# Patient Record
Sex: Male | Born: 1950
Health system: Southern US, Community
[De-identification: ages and names within clinical notes are randomized; demographics above are authoritative.]

## PROBLEM LIST (undated history)

## (undated) DIAGNOSIS — E785 Hyperlipidemia, unspecified: Secondary | ICD-10-CM

## (undated) DIAGNOSIS — Z973 Presence of spectacles and contact lenses: Secondary | ICD-10-CM

## (undated) DIAGNOSIS — Z8719 Personal history of other diseases of the digestive system: Secondary | ICD-10-CM

## (undated) DIAGNOSIS — N401 Enlarged prostate with lower urinary tract symptoms: Secondary | ICD-10-CM

## (undated) DIAGNOSIS — T7840XA Allergy, unspecified, initial encounter: Secondary | ICD-10-CM

## (undated) DIAGNOSIS — I10 Essential (primary) hypertension: Secondary | ICD-10-CM

## (undated) DIAGNOSIS — I739 Peripheral vascular disease, unspecified: Secondary | ICD-10-CM

## (undated) DIAGNOSIS — Z86718 Personal history of other venous thrombosis and embolism: Secondary | ICD-10-CM

## (undated) DIAGNOSIS — Z8711 Personal history of peptic ulcer disease: Secondary | ICD-10-CM

## (undated) DIAGNOSIS — C61 Malignant neoplasm of prostate: Secondary | ICD-10-CM

## (undated) DIAGNOSIS — N529 Male erectile dysfunction, unspecified: Secondary | ICD-10-CM

## (undated) DIAGNOSIS — N138 Other obstructive and reflux uropathy: Secondary | ICD-10-CM

## (undated) HISTORY — DX: Peripheral vascular disease, unspecified: I73.9

## (undated) HISTORY — DX: Hyperlipidemia, unspecified: E78.5

## (undated) HISTORY — PX: TONSILLECTOMY: SUR1361

## (undated) HISTORY — DX: Essential (primary) hypertension: I10

## (undated) HISTORY — PX: FEMORAL ARTERY STENT: SHX1583

---

## 2000-09-11 ENCOUNTER — Encounter (INDEPENDENT_AMBULATORY_CARE_PROVIDER_SITE_OTHER): Payer: Self-pay | Admitting: *Deleted

## 2000-09-11 ENCOUNTER — Ambulatory Visit (HOSPITAL_COMMUNITY): Admission: RE | Admit: 2000-09-11 | Discharge: 2000-09-11 | Payer: Self-pay | Admitting: Gastroenterology

## 2006-03-16 ENCOUNTER — Ambulatory Visit (HOSPITAL_COMMUNITY): Admission: RE | Admit: 2006-03-16 | Discharge: 2006-03-16 | Payer: Self-pay | Admitting: Family Medicine

## 2006-09-28 ENCOUNTER — Ambulatory Visit (HOSPITAL_COMMUNITY): Admission: RE | Admit: 2006-09-28 | Discharge: 2006-09-28 | Payer: Self-pay | Admitting: Orthopedic Surgery

## 2006-09-28 HISTORY — PX: OTHER SURGICAL HISTORY: SHX169

## 2008-06-09 ENCOUNTER — Ambulatory Visit (HOSPITAL_COMMUNITY): Admission: RE | Admit: 2008-06-09 | Discharge: 2008-06-09 | Payer: Self-pay | Admitting: Family Medicine

## 2008-06-13 ENCOUNTER — Ambulatory Visit (HOSPITAL_COMMUNITY): Admission: RE | Admit: 2008-06-13 | Discharge: 2008-06-13 | Payer: Self-pay | Admitting: Family Medicine

## 2008-07-08 ENCOUNTER — Ambulatory Visit: Payer: Self-pay | Admitting: Vascular Surgery

## 2008-07-22 ENCOUNTER — Ambulatory Visit: Payer: Self-pay | Admitting: Surgery

## 2008-07-22 ENCOUNTER — Ambulatory Visit (HOSPITAL_COMMUNITY): Admission: RE | Admit: 2008-07-22 | Discharge: 2008-07-22 | Payer: Self-pay | Admitting: Surgery

## 2008-08-11 ENCOUNTER — Ambulatory Visit: Payer: Self-pay | Admitting: Surgery

## 2008-10-22 ENCOUNTER — Ambulatory Visit: Payer: Self-pay | Admitting: Surgery

## 2009-02-09 ENCOUNTER — Ambulatory Visit: Payer: Self-pay | Admitting: Surgery

## 2009-05-19 ENCOUNTER — Ambulatory Visit: Payer: Self-pay | Admitting: Surgery

## 2009-08-24 ENCOUNTER — Ambulatory Visit: Payer: Self-pay | Admitting: Surgery

## 2009-12-14 ENCOUNTER — Ambulatory Visit: Payer: Self-pay | Admitting: Surgery

## 2010-02-26 ENCOUNTER — Ambulatory Visit: Payer: Self-pay | Admitting: Surgery

## 2010-11-08 ENCOUNTER — Ambulatory Visit: Admit: 2010-11-08 | Payer: Self-pay | Admitting: Surgery

## 2010-11-08 ENCOUNTER — Ambulatory Visit: Admission: RE | Admit: 2010-11-08 | Discharge: 2010-11-08 | Payer: Self-pay | Source: Home / Self Care

## 2010-11-08 NOTE — Procedures (Unsigned)
LOWER EXTREMITY ARTERIAL DUPLEX  INDICATION:  Follow up left SFA stent.  HISTORY: Diabetes:  No. Cardiac:  No. Hypertension:  Yes. Smoking:  Previous. Previous Surgery:  Left SFA stent, 07/22/08.  SINGLE LEVEL ARTERIAL EXAM                         RIGHT                LEFT Brachial:               142                  141 Anterior tibial:        122                  110 Posterior tibial:       162                  117 Peroneal: Ankle/Brachial Index:   0.14                 0.82 Previous ABI  02/2010:         R: 1.13                 L: 0.94  LOWER EXTREMITY ARTERIAL DUPLEX EXAM  DUPLEX: 1. Moderate diffuse disease in the inflow SFA with significantly     elevated velocities in the proximal left SFA stent. 2. Decreased left ABI since previous exam.  IMPRESSION: 1. Decreased left ankle brachial index since the previous examination     of 02/26/2010. 2. Significant stenosis of the left superficial femoral artery stent. 3. Moderate diffuse disease in the proximal left superficial femoral     artery.       ___________________________________________ V. Charlena Cross, MD  LT/MEDQ  D:  11/08/2010  T:  11/08/2010  Job:  [id number]

## 2010-11-09 NOTE — Assessment & Plan Note (Signed)
OFFICE VISIT  [name], [name] DOB:  07-14-1951                                       11/08/2010 ZOXWR#:[id number]  REASON FOR VISIT:  Followup.  This is a 60 year old gentleman who is status post stenting of his left superficial femoral artery in October of 2009 for lifestyle limiting claudication.  His stent alleviated his symptoms and he is back today for routine surveillance followup.  He does complain of some cramping in his left calf but he attributes this more to his cholesterol medicine. He has quit smoking, which he is very proud of since he has been smoking approximately 2 packs a day.  PHYSICAL EXAMINATION:  Vital signs:  Heart rate 78, blood pressure 128/79, O2 sat 99% on room air.  General:  He is well-appearing, in no distress.  Respirations:  Nonlabored.  Cardiovascular:  Regular rate and rhythm.  Extremities:  Warm, well-perfused without ulceration.  Skin: Without rash.  DIAGNOSTIC STUDIES:  The patient has had progression of the velocity profile within his stent.  He now has a velocity of 440 cm/sec in the proximal portion of the stent.  There is also an area of new stenosis within his mid superficial femoral artery.  ASSESSMENT:  End-stent stenosis.  PLAN:  I have scheduled the patient to undergo repeat angiogram with possible intervention on the left leg to treat in-stent stenosis and possibly a new area of stenosis.  His previous stent was a 7 x 40 Absolute Pro.  Again, this has been scheduled for Tuesday February 7.    Jorge Ny, MD Electronically Signed  VWB/MEDQ  D:  11/08/2010  T:  11/09/2010  Job:  [id number]  cc:   Ernestina Penna, M.D.

## 2010-11-16 ENCOUNTER — Ambulatory Visit (HOSPITAL_COMMUNITY)
Admission: RE | Admit: 2010-11-16 | Discharge: 2010-11-16 | Disposition: A | Discharge status: Home / Self Care | Payer: PRIVATE HEALTH INSURANCE | Source: Ambulatory Visit | Attending: Surgery | Admitting: Surgery

## 2010-11-16 DIAGNOSIS — I70219 Atherosclerosis of native arteries of extremities with intermittent claudication, unspecified extremity: Secondary | ICD-10-CM

## 2010-11-16 DIAGNOSIS — T82898A Other specified complication of vascular prosthetic devices, implants and grafts, initial encounter: Secondary | ICD-10-CM | POA: Insufficient documentation

## 2010-11-16 DIAGNOSIS — Y831 Surgical operation with implant of artificial internal device as the cause of abnormal reaction of the patient, or of later complication, without mention of misadventure at the time of the procedure: Secondary | ICD-10-CM | POA: Insufficient documentation

## 2010-11-16 LAB — POCT ACTIVATED CLOTTING TIME
Activated Clotting Time: 164 seconds
Activated Clotting Time: 187 seconds
Activated Clotting Time: 152 seconds
Activated Clotting Time: 158 seconds

## 2010-11-16 LAB — POCT I-STAT, CHEM 8
Creatinine, Ser: 1.3 mg/dL (ref 0.4–1.5)
Glucose, Bld: 96 mg/dL (ref 70–99)
Potassium: 4.1 mEq/L (ref 3.5–5.1)
Sodium: 142 mEq/L (ref 135–145)
TCO2: 24 mmol/L (ref 0–100)

## 2010-11-21 NOTE — Op Note (Addendum)
NAMESHERARD, SUTCH                   ACCOUNT NO.:  [id number]  MEDICAL RECORD NO.:  [id number]           PATIENT TYPE:  O  LOCATION:  [facility]                         FACILITY:  [facility]  PHYSICIAN:  Juleen China IV, MDDATE OF BIRTH:  18-Feb-1951  DATE OF PROCEDURE:  11/16/2010 DATE OF DISCHARGE:  11/16/2010                              OPERATIVE REPORT   PREOPERATIVE DIAGNOSIS:  In-stent stenosis, left superficial femoral artery stent.  POSTOPERATIVE DIAGNOSIS:  In-stent stenosis, left superficial femoral artery stent.  PROCEDURES PERFORMED: 1. Ultrasound access of left common femoral artery (antegrade). 2. Left leg runoff. 3. Stent of left superficial femoral artery (an Abbott Absolute Pro 7     x 100).  INDICATIONS:  This is a 60 year old gentleman who had previous undergone left SFA stenting approximately 2-1/2 years ago.  He has developed in- stent stenosis by duplex criteria.  He comes today for arteriogram with possible intervention.  PROCEDURE:  The patient was identified in the holding area and taken to room #8 and placed supine on the table.  The both groins were prepped and draped in the usual sterile fashion.  A time-out was called.  Last procedure had difficulty crossing of bifurcation, therefore his stent was placed via antegrade access, therefore I elected to start antegrade at this time.  The common femoral artery was identified with ultrasound. Lidocaine 1% was used for local anesthesia.  A digital image was acquired.  The left common femoral artery was accessed in an antegrade fashion under ultrasound guidance with a micropuncture needle.  A 0.018 wire was then advanced in the superficial femoral artery under fluoroscopic visualization and a micropuncture sheath was placed.  This was followed by a Bentson wire and a 6-French sheath.  Contrast injections were performed through the sheath to evaluate the left leg.  FINDINGS:  The visualized portions of the left  common femoral artery were widely patent.  The visualized portions of the profunda femoral artery which are obtained via reflux are patent.  The superficial femoral and popliteal artery are patent.  There is a previously deployed stent that is visualized.  There is a high-grade proximal 95% stenosis at its origin.  There is diffuse disease in the SFA proximal to the stent.  There is three-vessel runoff.  INTERVENTION:  After the above findings were obtained, the decision was made to intervene.  The patient was given systemic heparinization.  I elected to primarily stent this area.  I selected an Abbott Absolute Pro 7 x 100 stent that was deployed across the in-stent stenosis and extending proximally and to cover the native disease.  This was molded to confirmation of the 6-mm balloon.  Followup angiogram was performed. I was not satisfied with the residual stenosis at the proximal portion of the stent and therefore I repeated balloon molding of the proximal stent and allowed the balloon to extend slightly proximal to the stent into the native artery.  The balloon was held and taken to 6 atmospheres and held out for 90 seconds.  Followup study revealed resolution of the stenosis in the superficial femoral artery as well  as the in-stent stenosis.  There was preservation of three-vessel runoff.  At this point, decision was made to terminate the procedure.  Catheters and wires were removed.  The patient was taken to the holding area for sheath pull.  IMPRESSION: 1. Successful stenting of the left superficial femoral artery to treat     native superficial femoral disease as well as in-stent stenosis in     the proximal portion of the previously deployed stent. 2. Two-vessel runoff.     Jorge Ny, MD     VWB/MEDQ  D:  11/16/2010  T:  11/17/2010  Job:  [id number]  Electronically Signed by Arelia Longest IV MD on 11/21/2010 10:00:03 PM

## 2010-12-20 ENCOUNTER — Encounter (INDEPENDENT_AMBULATORY_CARE_PROVIDER_SITE_OTHER): Payer: PRIVATE HEALTH INSURANCE

## 2010-12-20 ENCOUNTER — Ambulatory Visit (INDEPENDENT_AMBULATORY_CARE_PROVIDER_SITE_OTHER): Payer: PRIVATE HEALTH INSURANCE | Admitting: Surgery

## 2010-12-20 DIAGNOSIS — I70219 Atherosclerosis of native arteries of extremities with intermittent claudication, unspecified extremity: Secondary | ICD-10-CM

## 2010-12-20 DIAGNOSIS — I743 Embolism and thrombosis of arteries of the lower extremities: Secondary | ICD-10-CM

## 2010-12-20 DIAGNOSIS — Z48812 Encounter for surgical aftercare following surgery on the circulatory system: Secondary | ICD-10-CM

## 2010-12-20 DIAGNOSIS — I739 Peripheral vascular disease, unspecified: Secondary | ICD-10-CM

## 2010-12-21 NOTE — Assessment & Plan Note (Addendum)
OFFICE VISIT  Tommy [name], Tommy [name] DOB:  [birth year]                                       12/20/2010 ZOXWR#:[id number]  The patient comes back in today for follow-up.  He underwent an angiogram with stenting of his left SFA on 02/072/2012.  He had previously had [name] left SFA stent on 07/22/2008 and had developed symptomatic in-stent stenosis.  He was found to have disease at the proximal portion of the stent which extended into his native artery.  It was successfully treated using [name] 7 x 100 Absolute Pro.  He states his symptoms have resolved.  He is not having any difficulties at this time.  PHYSICAL EXAMINATION:  Heart rate 90, blood pressure 105/88, temperature is 97.9.  General:  He is well-appearing in no distress.  Respirations and nonlabored.  He has palpable pedal pulses without ulceration.  No groin complications.  ASSESSMENT/PLAN:  Status post left superficial femoral artery stenting. The patient will continue with our ultrasound surveillance protocol which will be every 3 months which will restart right now for the first year.  I am going to have him come back after his initial duplex in 3 months and be seen in our PA clinic to make sure there are no complications or in-stent stenosis which may need be addressed and then I will plan on seeing him in 1 year after his ultrasound.    Jorge Ny, MD Electronically Signed  VWB/MEDQ  D:  12/20/2010  T:  12/21/2010  Job:  [id number]  cc:   Lindaann Pascal

## 2011-02-09 ENCOUNTER — Encounter: Payer: Self-pay | Admitting: Physician Assistant

## 2011-02-09 DIAGNOSIS — I10 Essential (primary) hypertension: Secondary | ICD-10-CM | POA: Insufficient documentation

## 2011-02-09 DIAGNOSIS — I739 Peripheral vascular disease, unspecified: Secondary | ICD-10-CM | POA: Insufficient documentation

## 2011-02-09 DIAGNOSIS — N400 Enlarged prostate without lower urinary tract symptoms: Secondary | ICD-10-CM

## 2011-02-09 DIAGNOSIS — E785 Hyperlipidemia, unspecified: Secondary | ICD-10-CM

## 2011-02-22 NOTE — Assessment & Plan Note (Signed)
OFFICE VISIT   Tommy [name], Tommy [name]  DOB:  08-30-1951                                       08/11/2008  EAVWU#:[id number]   REASON FOR VISIT:  Followup.   HISTORY:  This is [name] 60 year old gentleman who is status post stenting of  his left superficial femoral artery secondary to [name] high-grade stenosis  performed on 07/22/2008.  The patient states that since that time he had  significant improvement in the pain he was having in his left foot.  He  is able to walk without discomfort, is able to sleep without pain.   PHYSICAL EXAMINATION:  Blood pressure 129/73, pulse is 80.  He is well  appearing, no distress.  Legs are warm and well perfused.  Has palpable  dorsalis pedis pulse.   DIAGNOSTIC STUDIES:  Ankle brachial index is 0.98 on the right and 1.0  on the left.  Previously the ABI on the left was 0.79.   ASSESSMENT/PLAN:  Left leg claudication.   PLAN:  Patient is now status post stenting of his left superficial  femoral artery.  I have recommended that he continue his Plavix for at  least 3 months but preferably, for [name] longer period of time.  I have  placed on the ultrasound surveillance protocol.  I am seeing back in 6  months.   Jorge Ny, MD  Electronically Signed   VWB/MEDQ  D:  08/11/2008  T:  08/12/2008  Job:  [id number]   cc:   Quita Skye. Hart [place], M.D.  Ernestina Penna, M.D.  Scott Long

## 2011-02-22 NOTE — Assessment & Plan Note (Signed)
OFFICE VISIT   [name], [name]  DOB:  [birth year]                                       08/24/2009  ZWCHE#:[id number]   REASON FOR VISIT:  Follow-up claudication.   CHIEF COMPLAINT:  None.   HISTORY:  This is a 60 year old gentleman who underwent stenting of his  left superficial femoral artery on July 22, 2008 secondary to  claudication and high-grade stenosis.  He has had significant  improvement in pain with walking since that time.  He still complains of  some cramps at night, otherwise, however, he is doing well without  complaints.   The patient suffers from hypertension which is well controlled with  medications.  His systolic pressures run in the 353I.  He also suffers  from hypercholesterolemia which has been stable over time.  He is on 2  cholesterol-lowering agents.  The patient continues to smoke, but has  cut down from 2 packs to less than a pack a day.   PAST MEDICAL HISTORY:  Hypertension, hypercholesterolemia, peripheral  vascular disease.   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  He currently smokes a pack a day.   REVIEW OF SYSTEMS:  Positive for recent change in hearing as well as  arthritis.  Others are negative as detailed in the encounter form.   PHYSICAL EXAMINATION:  Vital signs:  Heart rate  94, blood pressure  122/73, temperature is 97.8.  General:  Well-appearing in no distress.  HEENT:  Normocephalic, atraumatic.  Neck:  Supple.  No JVD.  Respirations are nonlabored.  Extremities:  Warm and well-perfused.  No  ulceration.  Neurologic:  No focal deficits.  Psych:  He is alert and  oriented x3.   DIAGNOSTIC STUDIES:  Ultrasound was performed today which has been  independently reviewed.  His ABIs continue to be 1.0 bilaterally.  There  was a slight increase in velocity within the stent at 265 cm/sec.   ASSESSMENT/PLAN:  1. Smoking cessation:  I spoke with the patient  about plans for      smoking cessation.  He has cut back from 2 packs to 1 pack a day      and is working to be completely abstinent.  2. The patient will continue with P scan protocol.  He will have his      next ultrasound in 6 months.  I will plan on seeing him back in a      year unless his velocities increase on ultrasound.  He is told that      if his symptoms come back prior to his ultrasound to contact our      office and we will see him sooner.   Tommy Ny, MD  Electronically Signed   VWB/MEDQ  D:  08/24/2009  T:  08/25/2009  Job:  [id number]   cc:   Dr. Jacqulyn [name]

## 2011-02-22 NOTE — Consult Note (Signed)
VASCULAR SURGERY CONSULTATION   Tommy [name], Tommy [name]  DOB:  28-Dec-1950                                       07/08/2008  CHART#:[id number]   This 60 year old patient has been having increasing calf claudication  symptoms left worse than right over the past 6 months.  He states that  his left calf becomes quite heavy with ambulation and is limiting him to  walking between 1/2 and 1 block.  He has complete relief by rest and is  then able to resume for similar distance.  He has no rest pain in his  feet and no numbness in his feet.  He does describe one episode of  speech being funny and his left leg being slightly unstable [name] few weeks  ago which resolved and he has had no recurrence.  He was evaluated in  the [facility] vascular lab with an ABI of 0.79 on the left and 1.1 on the  right secondary to femoral popliteal occlusive disease.  He had [name] CT  angiogram performed at [facility] which revealed [name] focal lesion in his  left superficial femoral artery with otherwise widely patent vessels.   PAST MEDICAL HISTORY:  1. Hypertension.  2. Negative for diabetes, coronary artery disease, COPD or stroke.   PAST SURGICAL HISTORY:  1. 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  2. An unknown ear surgery.   FAMILY HISTORY:  Positive for coronary artery disease, diabetes and  stroke in his mother.   SOCIAL HISTORY:  Married and has three children, works in [name] [employer], [employer].  He smokes [name] pack of cigarettes per day for 20+ years.  Does not  use alcohol.   REVIEW OF SYSTEMS:  Occasionally will have some mild chest discomfort  and arthritis pain but otherwise unremarkable.   ALLERGIES TO MEDICATIONS:  Penicillin.   MEDICATIONS:  Please see health history form.   PHYSICAL EXAMINATION:  Vital signs:  Blood pressure 122/78, heart rate  80, respirations 14.  General:  He is [name] healthy-appearing middle-aged  male in no apparent distress, alert and oriented x3.  Neck:  Supple, 3+  carotid pulses palpable.  No bruits are audible.  Neurological:  Normal.  No palpable adenopathy in the neck.  Chest:  Clear to auscultation.  Cardiovascular:  Regular rhythm.  No murmurs.  Abdomen:  Soft, nontender  with no masses.  He has 3+ femoral popliteal and posterior tibial pulse  on the right.  Left leg has [name] 3+ femoral, 1-2+ popliteal and [name] 2+  dorsalis pedis pulse.   I reviewed the CT angiogram and his Doppler study and agree that he does  have left superficial femoral occlusive disease.  I obtained [name] carotid  duplex exam because of questionable TIA and there is no evidence of any  significant plaque or flow reduction.  I believe that he would be best  treated by angiography and probable PTA and stenting of his left  superficial femoral artery.  I have scheduled that for Dr. Myra Gianotti to  perform on Tuesday, October 13 at [facility] as an outpatient and  hopefully this will relieve his symptoms.   Quita Skye Hart [place], M.D.  Electronically Signed  JDL/MEDQ  D:  07/08/2008  T:  07/09/2008  Job:  [id number]   cc:   Ernestina Penna, M.D.  Lindaann Pascal, [place]

## 2011-02-22 NOTE — Procedures (Signed)
BYPASS GRAFT EVALUATION   INDICATION:  Follow up left superficial femoral artery stent.   HISTORY:  Diabetes:  No.  Cardiac:  No.  Hypertension:  Yes.  Smoking:  Yes.  Previous Surgery:  Left superficial femoral artery stent on 07/22/08.   SINGLE LEVEL ARTERIAL EXAM                               RIGHT              LEFT  Brachial:                    139                131  Anterior tibial:             135                98  Posterior tibial:            157                130  Peroneal:  Ankle/brachial index:        1.13               0.94   PREVIOUS ABI:  Date: 12/14/09  RIGHT:  1.17  LEFT:  1.09   LOWER EXTREMITY BYPASS GRAFT DUPLEX EXAM:   DUPLEX:  Patent left superficial femoral artery stent with elevated  velocities at mid superficial femoral artery of 402 cm/s and 329 cm at  mid-to-distal superficial femoral artery.  Elevated velocities in the  superficial femoral artery in comparison from previous study.   IMPRESSION:  1. Normal right ankle brachial index.  Left ankle brachial index has      dropped and suggests mild arterial disease.  2. Patent left superficial femoral artery with elevated velocities, as      noted above.   ___________________________________________  V. Charlena Cross, MD   CB/MEDQ  D:  02/26/2010  T:  02/26/2010  Job:  [id number]

## 2011-02-22 NOTE — Procedures (Signed)
BYPASS GRAFT EVALUATION   INDICATION:  Followup left SFA stent.  The patient states bilateral  claudication for the last couple of days.  However, has been working in  cold and he feels that is the reason.   HISTORY:  Diabetes:  No.  Cardiac:  No.  Hypertension:  Yes.  Smoking:  Yes.  Previous Surgery:  Left SFA stent 07/22/2008 by Dr. Myra Gianotti.   SINGLE LEVEL ARTERIAL EXAM                               RIGHT              LEFT  Brachial:                    132                126  Anterior tibial:             145                142  Posterior tibial:            158                149  Peroneal:  Ankle/brachial index:        1.2                1.13   PREVIOUS ABI:  Date: 08/11/2008  RIGHT:  1.3  LEFT:  1.13   LOWER EXTREMITY BYPASS GRAFT DUPLEX EXAM:   DUPLEX:  Doppler arterial waveforms appear triphasic within the left  common femoral, superficial femoral and popliteal arteries.  Patent left superficial femoral artery stent with no evidence of  stenosis.   IMPRESSION:  1. ABIs appear stable and within normal limits bilaterally.  2. Patent left superficial femoral artery stent with no evidence of      stenosis.   ___________________________________________  V. Charlena Cross, MD   AS/MEDQ  D:  10/22/2008  T:  10/22/2008  Job:  [id number]

## 2011-02-22 NOTE — Procedures (Signed)
BYPASS GRAFT EVALUATION   INDICATION:  Follow up left SFA stent, intermittent bilateral  claudication.   HISTORY:  Diabetes:  No.  Cardiac:  No.  Hypertension:  Yes.  Smoking:  Yes.  Previous Surgery:  Left SFA stent, 07/22/08 by Dr. Myra Gianotti.   SINGLE LEVEL ARTERIAL EXAM                               RIGHT              LEFT  Brachial:                    138                136  Anterior tibial:             134                135  Posterior tibial:            158                142  Peroneal:  Ankle/brachial index:        1.14               1.03   PREVIOUS ABI:  Date: 02/09/09  RIGHT:  1.03  LEFT:  1.03   LOWER EXTREMITY BYPASS GRAFT DUPLEX EXAM:   DUPLEX:  1. Doppler arterial waveforms appear triphasic in the left common      femoral artery, superficial femoral artery, and popliteal artery.  2. Elevated velocities noted in the left SFA stent of 231 cm/s.   IMPRESSION:  1. Patent left superficial femoral artery stent with elevated      velocities, an increase from previous study.  2. Bilateral ankle brachial indices appear stable from previous study.   Dr. Hart Rochester was informed of the results and stated to keep on 17-month  protocol.   ___________________________________________  V. Charlena Cross, MD   AS/MEDQ  D:  05/19/2009  T:  05/19/2009  Job:  [id number]

## 2011-02-22 NOTE — Procedures (Signed)
CAROTID DUPLEX EXAM   INDICATION:  Questionable TIA.   HISTORY:  Diabetes:  No.  Cardiac:  No.  Hypertension:  Yes.  Smoking:  Yes.  Previous Surgery:  CV History:  Amaurosis Fugax No, Paresthesias No, Hemiparesis No.                                       RIGHT             LEFT  Brachial systolic pressure:         122  Brachial Doppler waveforms:  Vertebral direction of flow:        Antegrade         Antegrade  DUPLEX VELOCITIES (cm/sec)  CCA peak systolic                   103               90  ECA peak systolic                   89                136  ICA peak systolic                   89                71  ICA end diastolic                   31                29  PLAQUE MORPHOLOGY:                  Heterogenous      Heterogenous  PLAQUE AMOUNT:                      Mild              Mild  PLAQUE LOCATION:                    ICA, ECA          ICA, ECA    IMPRESSION:  1. 1-39% stenosis noted in the bilateral internal carotid arteries.  2. Antegrade bilateral vertebral arteries.     ___________________________________________  Quita Skye Hart [place], M.D.   MG/MEDQ  D:  07/08/2008  T:  07/08/2008  Job:  [id number]

## 2011-02-22 NOTE — Procedures (Signed)
BYPASS GRAFT EVALUATION   INDICATION:  Follow up left lower extremity stent.   HISTORY:  Diabetes:  No  Cardiac:  No  Hypertension:  Yes  Smoking:  Yes.  Previous Surgery:  Left superficial femoral artery stent on 07/22/2008.   SINGLE LEVEL ARTERIAL EXAM                               RIGHT              LEFT  Brachial:                    134                134  Anterior tibial:             115                130  Posterior tibial:            137                136  Peroneal:  Ankle/brachial index:        1.02               1.01   PREVIOUS ABI:  Date: 05/19/2009  RIGHT:  1.14  LEFT:  1.03   LOWER EXTREMITY BYPASS GRAFT DUPLEX EXAM:   DUPLEX:  Triphasic Doppler waveforms noted throughout the left common  femoral, superficial femoral and popliteal arteries with increased  velocity of 265 cm per second noted at the left mid to distal  superficial femoral artery level.   IMPRESSION:  1. Patent left superficial femoral artery stent with an increased      velocity noted, as described above.  2. Stable bilateral ankle-brachial indices.     ___________________________________________  V. Charlena Cross, MD   CH/MEDQ  D:  08/25/2009  T:  08/25/2009  Job:  [id number]

## 2011-02-22 NOTE — Op Note (Signed)
NAME:  Tommy [name], Tommy [name]                   ACCOUNT NO.:  [id number]   MEDICAL RECORD NO.:  [id number]          PATIENT TYPE:  AMB   LOCATION:  SDS                          FACILITY:  [facility]   PHYSICIAN:  Juleen China IV, MDDATE OF BIRTH:  July 22, 1951   DATE OF PROCEDURE:  07/22/2008  DATE OF DISCHARGE:  07/22/2008                               OPERATIVE REPORT   PREOPERATIVE DIAGNOSIS:  Left leg claudication.   POSTOPERATIVE DIAGNOSIS:  Left leg claudication.   PROCEDURE PERFORMED:  1. Ultrasound-guided access of right common femoral artery.  2. Ultrasound-guided access of left common femoral artery.  3. Abdominal aortogram.  4. Second order catheterization (left external iliac artery).  5. Left lower extremity runoff.  6. Stent, left superficial femoral artery.   INDICATIONS:  A 60 year old gentleman with severe left LMA, left leg  claudication.  He previously had a CT angiogram with runoff, which  revealed a high grade left superficial femoral artery lesion.  He comes  in today for arteriogram, possibility of an intervention.   PROCEDURE:  The patient was identified in the holding area and taken to  room 8.  He was placed supine on the table.  Bilateral groins were  prepped and draped in a standard sterile fashion.  Time-out was called.  The right common femoral artery was evaluated with an ultrasound and  found to be widely patent.  After 1% lidocaine was accessed under  ultrasound guidance, using an 18-gauge needle.  An 0.35 Teena Dunk wire was  advanced in a retrograde fashion into the abdominal aorta under  fluoroscopic visualization.  Next, a 5-French sheath was placed over the  wire and Omni flush catheter was placed at level L1 and abdominal  aortogram was obtained.  Next, catheter was pulled down the aortic  bifurcation.  Pelvic angiogram was obtained.  Aortic bifurcation was  crossed using a Omni flush catheter and a Glidewire.  Then due to the  tortuosity of the iliac  arterial system, a 5-French end-hole catheter  was placed into the left common iliac artery and contrast injections  were performed for left leg runoff.   FINDINGS:  Aortogram:  Visualized portions of the suprarenal abdominal  aorta shows minimal disease.  There are single renal arteries  bilaterally, which are widely patent.  The infrarenal abdominal aorta is  widely patent without significant disease.   Pelvic Angiogram:  Bilateral common iliac and external iliac and  hypogastric arteries were widely patent.  The iliac arterial system is  very tortuous.   Left Lower Extremity:  The left common femoral artery is widely patent.  The left common femoral artery is widely patent.  The left profunda  femoral artery is widely patent.  The left superficial femoral artery is  patent.  There is a high grade stenosis within the adductor canal.  There is a three-vessel runoff.   Intervention:  After being unable to cross the aortic bifurcation.  I  elected to proceed with antegrade access.  The left common femoral  artery was evaluated by ultrasound and found to be patent.  It  was  easily compressible.  A 1% lidocaine was used for local anesthesia.  The  left femoral artery was accessed in antegrade fashion using  micropuncture needle.  A 0.14 mandrel wire was then advanced into the  superficial femoral artery under fluoroscopic visualization and a 5-  [place] fifth micropuncture sheath was placed.  The introducer wire  removed and a 0.035 wooly wire was advanced into the popliteal artery.  A 6-French sheath was then placed.  The patient was then given systemic  heparinization.  Next, adhesion within the superficial femoral artery  was primarily stented using a 7 x 40 Abbott self-expanding stent.  There  was more little conformation with a 6-mm balloon.  Completion  arteriogram reveals significantly improved flow through the stent and  resolution of the stenosis.  The runoff is similar to  preintervention.   At this point, catheters and wires were removed.  The patient will be  taken to holding area for sheath pull, once his once his coagulation  profile corrects.   IMPRESSION:  1. High-grade left superficial femoral artery stenosis, successfully      treated using a 7 x 40 self-expanding Abbott stent.  2. Highly tortuous iliac arterial system.           ______________________________  V. Charlena Cross, MD  Electronically Signed     VWB/MEDQ  D:  07/22/2008  T:  07/23/2008  Job:  [id number]

## 2011-02-22 NOTE — Procedures (Signed)
BYPASS GRAFT EVALUATION   INDICATION:  Follow up evaluation of left superficial femoral artery  stent.   HISTORY:  Diabetes:  No  Cardiac:  No  Hypertension:  Yes  Smoking:  Yes  Previous Surgery:  Left superficial femoral artery stent on 07/22/2008  by Dr. Myra Gianotti.   SINGLE LEVEL ARTERIAL EXAM                               RIGHT              LEFT  Brachial:                    118                123  Anterior tibial:             98                 117  Posterior tibial:            127                127  Peroneal:  Ankle/brachial index:        >1.0               >1.0   PREVIOUS ABI:  Date: 10/22/2008  RIGHT:  >1.0  LEFT:  >1.0   LOWER EXTREMITY BYPASS GRAFT DUPLEX EXAM:   DUPLEX:  Doppler arterial waveforms are triphasic in the left common  femoral, left superficial femoral and left posterior and anterior tibial  arteries.  No evidence of restenosis within the stent.   IMPRESSION:  1. ABIs are stable compared to previous study bilaterally and suggests      no significant arterial occlusive disease.  2. Patent left superficial femoral artery stent.        ___________________________________________  V. Charlena Cross, MD   MC/MEDQ  D:  02/09/2009  T:  02/09/2009  Job:  [id number]

## 2011-02-22 NOTE — Assessment & Plan Note (Signed)
OFFICE VISIT   [name], [name]  DOB:  October 23, 1950                                       12/14/2009  ZOXWR#:[id number]   Patient is a 60 year old gentleman who returns today for unscheduled  visit.  He has been having cramps in his left leg which wake him up at  night for the past couple of weeks.  He has a history of undergoing  stenting of his left superficial femoral artery in October of 2009 for  lifestyle-limiting claudication.  He is not having claudication symptoms  at this time.  He does not have any nonhealing ulcers.   PHYSICAL EXAMINATION:  Heart rate 76, blood pressure 137/80, temperature  is 97.9.  generally, he is well-appearing in no distress.  Respirations  are nonlabored.  Extremities reveal palpable pedal pulses.   DIAGNOSTIC STUDIES:  Ankle brachial indexes performed today and compared  to the most recent study from November.  His ABIs are 1 bilaterally with  no significant change.   ASSESSMENT:  Peripheral vascular disease.   PLAN:  I reassured the patient that his cramping is not due to poor  circulation.  I have told him that he should make sure he drinks plenty  of water, as this could be a result of dehydration.  Also I have told  him to take a multivitamin.  He is scheduled to have his next protocol  scan this summer.  I told he would be seen in the PA clinic.  I want to  make sure that there are no step-ups in his velocity profile within the  stents.  If there are and are greater than 300, we would consider a  repeat intervention.  If they are less than 300, we will continue to  follow them.     Jorge Ny, MD  Electronically Signed   VWB/MEDQ  D:  12/14/2009  T:  12/15/2009  Job:  [id number]   cc:   Ernestina Penna, M.D.  Scott Long

## 2011-02-25 NOTE — Op Note (Signed)
NAMEKRIST, ROSENBOOM                   ACCOUNT NO.:  [id number]   MEDICAL RECORD NO.:  [id number]          PATIENT TYPE:  AMB   LOCATION:  DAY                          FACILITY[facility]  [facility]   PHYSICIAN:  Madelynn Done, MD  DATE OF BIRTH:  08-Aug-1951   DATE OF PROCEDURE:  09/28/2006  DATE OF DISCHARGE:                               OPERATIVE REPORT   PREOPERATIVE DIAGNOSIS:  Left index proximal phalangeal fracture with  rotational deformity.   POSTOPERATIVE DIAGNOSIS:  Left index proximal phalangeal fracture with  rotational deformity.   ATTENDING SURGEON:  Madelynn Done, MD, who was scrubbed and present  for the entire procedure.   ASSISTANT SURGEON:  Georges Lynch. Gioffre, M.D., who scrubbed in for key  portions of the procedure.   PROCEDURE:  1. Open treatment of left index finger proximal interphalangeal      fracture with internal fixation.  2. Radiographs, three views, left hand.   IMPLANTS:  Three 2.0 mm Synthes titanium screws.   SURGICAL ANESTHESIA:  General via LMA.   SURGICAL INDICATIONS:  Mr. Upchurch is a 60 year old left-hand dominant  gentleman who sustained a work-related injury to his index finger  approximately one week ago.  Patient was seen in the office and noted to  have a malrotated, displaced proximal phalangeal fracture.  Risks,  benefits and alternatives are discussed in detail with the patient, and  signed informed consent was obtained.  Risks include but not limited to  bleeding, infection, nerve damage, hardware prominence, hardware  failure, tendon irritation, lack of motion of the finger, and need for  further surgery.   DESCRIPTION OF PROCEDURE:  Patient is properly identified in the  preoperative holding area.  A mark is made on the left index finger to  identify the correct operative site.  The patient was then brought back  to the operating room and placed supine on the anesthesia room table,  where general anesthesia was administered via LMA.   The patient  tolerated this procedure well.  A well-padded tourniquet was then placed  on the left brachium and sealed with a thousand drape .  The left upper  extremity is then prepped with Hibiclens and alcohol and then sterilely  draped.  An S-shaped incision centered over the proximal phalanx was  then made after esmarch exsanguination and the tourniquet insufflated to  250 mmHg.  Dissection was carried down through the skin and subcutaneous  tissue.  The extensor mechanism was then dissected, and the tissue  planes were elevated both radially and ulnarly.  A longitudinal split in  the extensor tendon.  Central tendon was then made and carried out both  proximally and distally, making sure not to disrupt the central slip and  not to go past the PIP joint.  A fracture hematoma and fracture callus  was then removed with curettes and rongeurs.  An open reduction was then  performed, and using a clamp, the fracture was then rotated, reduced,  and held in position.  Three 2 mm screws were then placed in a lag  fashion,  drilling the far-near cortexes on the 1.5 drill bit, drilling  the near cortex with a 2.0 drill bit, and counter-sinking the screws,  taking the appropriate depth gauge measurement and then placing the  screws with good compression across the fracture site.  After placement  of all three screws, the images were obtained using a mini C-arm, which  confirmed a good anatomical reduction with no excessive prominence of  the hardware.   The wound was then thoroughly irrigated.  The extensor mechanism was  then closed with a running 4-0 Ethibond sutures and then proximally and  distally closed with a simple interrupted 4-0 Ethibond suture.  The skin  was then closed with a horizontal mattress 5-0 nylon suture.  A sterile  compressive dressing was then applied.  The tourniquet was deflated with  good perfusion of the digits, approximately one hour.  A 0.25% Marcaine  digital block  was then performed.  The patient tolerated the procedure.  A well-padded dressing and a radial gutter splint was then fashioned to  include the index and a long finger.  The patient tolerated the  procedure well.  He was extubated and taken to the recovery room in good  condition.   Intraoperative radiographs, three views of the right hand, were  reviewed, which do confirm good anatomical alignment of the proximal  pharyngeal fracture.  There are three screws transversing the fracture  site.   CLINICAL EXAMINATION:  With the patient in flexion and extension,  rotation on the deformity was improved.  It was difficult to compare to  the contralateral side because he has had amputation of the long finger,  but it appears symmetric to the contralateral index finger.   POSTOPERATIVE PLAN:  The patient will be seen back in the office in 10  days for wound check and suture removal.  I will then follow protocol  for screw fixation of proximal phalangeal fracture.  Xrays at each visit  of the finger.      Madelynn Done, MD  Electronically Signed     FWO/MEDQ  D:  09/28/2006  T:  09/28/2006  Job:  [id number]

## 2011-02-25 NOTE — Procedures (Signed)
[place]  Patient:    Tommy [name], Tommy [name]                          MRN: [id number] Proc. Date: 09/11/00 Adm. Date:  [id number] Disc. Date: [id number] Attending:  Nelda Marseille CC:         Dr. Vernon Prey, [facility]   Procedure Report  PROCEDURE:  Esophagogastroduodenoscopy.  INDICATION:  Patient with upper tract symptoms, dysphagia helped with Prilosec, abnormal barium swallow.  Consent was signed after risks, benefits, methods, and options thoroughly discussed in the office.  MEDICATIONS:  Medicines used additionally for this procedure was none, since it followed colonoscopy.  DESCRIPTION OF PROCEDURE:  The video endoscope was inserted by direct vision. The proximal and midesophagus were normal.  In the distal esophagus, possibly a small, tiny fibrous ring was seen.  There was a fair amount of spasm at the GE junction but no obvious mass lesions was seen.  The scope passed into the stomach and advanced through a normal antrum, normal pylorus, into a normal duodenal bulb and around the C-loop to a normal second portion of duodenum. The scope was withdrawn back to the stomach and retroflexed.  Angularis, cardia, and fundus were normal, as were the lesser and greater curve on retroflex visualization.  The scope was straightened, and straight visualization in the stomach confirmed above findings.  The stomach was normal.  Air was suctioned, and the scope was slowly withdrawn.  Again, a good look at the esophagus on slow withdrawal confirmed the above findings.  When back at 20 cm, we readvanced into the stomach, which again confirmed the above findings.  Scope was removed.  The patient tolerated the procedure well. There was no obvious immediate complication.  ENDOSCOPIC DIAGNOSES: 1. Tight fibrous ring at the distal esophagus with some spasm. 2. Otherwise normal EGD.  PLAN:  Continue Prilosec, since that is helping.  GI follow-up p.r.n. or  in three months to recheck symptoms and make sure no further workup plans are needed. DD:  09/11/00 TD:  09/11/00 Job: [id number]

## 2011-03-24 ENCOUNTER — Ambulatory Visit: Payer: PRIVATE HEALTH INSURANCE

## 2011-03-25 ENCOUNTER — Ambulatory Visit: Payer: PRIVATE HEALTH INSURANCE

## 2011-04-01 ENCOUNTER — Ambulatory Visit: Payer: PRIVATE HEALTH INSURANCE

## 2011-04-15 ENCOUNTER — Ambulatory Visit: Payer: PRIVATE HEALTH INSURANCE | Admitting: Physician Assistant

## 2011-04-29 ENCOUNTER — Encounter (INDEPENDENT_AMBULATORY_CARE_PROVIDER_SITE_OTHER): Payer: PRIVATE HEALTH INSURANCE

## 2011-04-29 ENCOUNTER — Ambulatory Visit (INDEPENDENT_AMBULATORY_CARE_PROVIDER_SITE_OTHER): Payer: PRIVATE HEALTH INSURANCE

## 2011-04-29 DIAGNOSIS — Z48812 Encounter for surgical aftercare following surgery on the circulatory system: Secondary | ICD-10-CM

## 2011-04-29 DIAGNOSIS — I739 Peripheral vascular disease, unspecified: Secondary | ICD-10-CM

## 2011-04-29 DIAGNOSIS — I7092 Chronic total occlusion of artery of the extremities: Secondary | ICD-10-CM

## 2011-04-29 NOTE — Procedures (Unsigned)
LOWER EXTREMITY ARTERIAL DUPLEX  INDICATION:  Followup left SFA stent.  HISTORY: Diabetes:  No. Cardiac:  No. Hypertension:  Yes. Smoking:  Previous. Previous Surgery:  Left SFA stent 07/22/2008.  SINGLE LEVEL ARTERIAL EXAM                         RIGHT                LEFT Brachial:               146                  144 Anterior tibial:        173                  155 Posterior tibial:       186                  164 Peroneal: Ankle/Brachial Index:   1.27                 1.12  LOWER EXTREMITY ARTERIAL DUPLEX EXAM  DUPLEX:  Patent left SFA stent with elevated velocities of 262 cm/s noted in the proximal segment of the stent.  IMPRESSION: 1. Patent left superficial femoral artery stenting with normal ankle     brachial indices bilaterally. 2. Please see the following worksheet for all velocity measurements.  ___________________________________________ V. Charlena Cross, MD  EM/MEDQ  D:  04/29/2011  T:  04/29/2011  Job:  [id number]

## 2011-04-29 NOTE — Assessment & Plan Note (Signed)
OFFICE VISIT  Tommy [name], Tommy [name] DOB:  Feb 15, 1951                                       04/29/2011 ZOXWR#:[id number]  This is [name] 60 year old male who underwent angiogram with stenting of his left SFA in February of 2012.  He previously had [name] left SFA stent in October of 2009 and developed symptomatic in-stent stenosis.  He was found to have disease at the proximal portion of the stent which extended into the native artery.  He was treated using [name] 7 x 100 Absolute Pro.  When he was seen by Dr. Myra Gianotti in March his symptoms had resolved and he was not having any difficulties at that time.  He returns today for his 3 month surveillance ultrasound.  This did reveal elevated velocities at the proximal portion of the stent of 250 and 262. The patient states that he is having pain in his legs for the past few weeks.  PHYSICAL EXAM:  His blood pressure is 142/100, heart rate is 77 and regular.  His respirations are 16.  His heart is in regular rate and rhythm.  His lungs are clear to auscultation bilaterally.  I hear no carotid bruits.  He does have palpable pedal pulses bilaterally.  The patient also stated that the day before yesterday his wife went to wake him up and he went to get out of bed and the next thing he knew he had blacked out and was on the floor.  He denies any stroke symptoms such as amaurosis fugax, slurred speech, hemiparalysis or paresthesias.  ASSESSMENT:  This is [name] 60 year old who is status post left superficial femoral artery stenting.  The patient does have some slightly elevated velocities at the proximal portion of his stent.  Since he is having some pain in his left leg I will set him up for an arteriogram with Dr. Myra Gianotti.  Considering he has had one episode of blacking out I have asked him to follow up with his primary care physician next week to evaluate.  Tommy [name], [place]  Fransisco Hertz, MD Electronically Signed  SE/MEDQ  D:   04/29/2011  T:  04/29/2011  Job:  [id number]

## 2011-05-17 ENCOUNTER — Ambulatory Visit (HOSPITAL_COMMUNITY): Admission: RE | Admit: 2011-05-17 | Payer: PRIVATE HEALTH INSURANCE | Source: Ambulatory Visit | Admitting: Surgery

## 2011-07-12 LAB — POCT I-STAT, CHEM 8
Calcium, Ion: 1.26
Creatinine, Ser: 1.2
Glucose, Bld: 86
Potassium: 4.1
Sodium: 140

## 2011-12-05 ENCOUNTER — Encounter (INDEPENDENT_AMBULATORY_CARE_PROVIDER_SITE_OTHER): Payer: PRIVATE HEALTH INSURANCE | Admitting: *Deleted

## 2011-12-05 DIAGNOSIS — I739 Peripheral vascular disease, unspecified: Secondary | ICD-10-CM

## 2011-12-05 DIAGNOSIS — Z48812 Encounter for surgical aftercare following surgery on the circulatory system: Secondary | ICD-10-CM

## 2011-12-09 ENCOUNTER — Other Ambulatory Visit: Payer: Self-pay | Admitting: Surgery

## 2011-12-09 DIAGNOSIS — I4891 Unspecified atrial fibrillation: Secondary | ICD-10-CM

## 2011-12-19 ENCOUNTER — Other Ambulatory Visit: Payer: Self-pay | Admitting: *Deleted

## 2011-12-19 ENCOUNTER — Encounter: Payer: Self-pay | Admitting: Surgery

## 2011-12-19 DIAGNOSIS — Z48812 Encounter for surgical aftercare following surgery on the circulatory system: Secondary | ICD-10-CM

## 2011-12-19 DIAGNOSIS — I739 Peripheral vascular disease, unspecified: Secondary | ICD-10-CM

## 2011-12-19 NOTE — Procedures (Unsigned)
LOWER EXTREMITY ARTERIAL EVALUATION-SINGLE LEVEL  INDICATION:  Followup left lower extremity  HISTORY: Diabetes:  No Cardiac:  No Hypertension:  Yes Smoking:  Previously Previous Surgery:  11/16/2010 left SFA stent at/in proximal end of the previous stent 11/16/2010  RESTING SYSTOLIC PRESSURES: (ABI)                         RIGHT                LEFT Brachial: Anterior tibial: Posterior tibial: Peroneal: DOPPLER WAVEFORM ANALYSIS: Anterior tibial: Posterior tibial: Peroneal:  PREVIOUS ABI'S:  Date:  12/05/2011  RIGHT:  1.27  LEFT:  1.13  DUPLEX:  Patent left superficial femoral artery stent with elevated velocity of 247 cm/s at the proximal stent.  IMPRESSION: 1. Patent left superficial femoral artery stenting with triphasic     waveforms throughout. 2. ABIs within normal limits, no change since previous study of     12/05/2011.   ___________________________________________ V. Charlena Cross, MD  SS/MEDQ  D:  12/05/2011  T:  12/05/2011  Job:  [id number]

## 2011-12-26 ENCOUNTER — Ambulatory Visit: Payer: PRIVATE HEALTH INSURANCE | Admitting: Surgery

## 2012-06-18 ENCOUNTER — Ambulatory Visit: Payer: PRIVATE HEALTH INSURANCE | Admitting: Surgery

## 2012-06-26 ENCOUNTER — Encounter: Payer: Self-pay | Admitting: Neurosurgery

## 2012-06-27 ENCOUNTER — Ambulatory Visit (INDEPENDENT_AMBULATORY_CARE_PROVIDER_SITE_OTHER): Payer: PRIVATE HEALTH INSURANCE | Admitting: Neurosurgery

## 2012-06-27 ENCOUNTER — Ambulatory Visit (INDEPENDENT_AMBULATORY_CARE_PROVIDER_SITE_OTHER): Payer: PRIVATE HEALTH INSURANCE | Admitting: Vascular Surgery

## 2012-06-27 ENCOUNTER — Encounter: Payer: Self-pay | Admitting: Neurosurgery

## 2012-06-27 VITALS — BP 124/77 | HR 69 | Resp 16 | Ht 71.0 in | Wt 188.5 lb

## 2012-06-27 DIAGNOSIS — I739 Peripheral vascular disease, unspecified: Secondary | ICD-10-CM

## 2012-06-27 DIAGNOSIS — Z48812 Encounter for surgical aftercare following surgery on the circulatory system: Secondary | ICD-10-CM

## 2012-06-27 DIAGNOSIS — I70219 Atherosclerosis of native arteries of extremities with intermittent claudication, unspecified extremity: Secondary | ICD-10-CM

## 2012-06-27 NOTE — Progress Notes (Signed)
[facility]/PVD Office Note  CC: PVD surveillance Referring Physician: Brabham  History of Present Illness: 61 year old male patient of Dr. Myra Gianotti status post left SFA stent October 2009 with PTA and stent in February 2012. The patient denies any claudication, rest pain or open ulcerations. The patient states he does have an occasional "cramp" but he thinks it is due to his statin drugs but causes very transient and varies in intensity.  Past Medical History  Diagnosis Date  . Peripheral vascular disease     ROS: [x]  Positive   [ ]  Denies    General: [ ]  Weight loss, [ ]  Fever, [ ]  chills Neurologic: [ ]  Dizziness, [ ]  Blackouts, [ ]  Seizure [ ]  Stroke, [ ]  "Mini stroke", [ ]  Slurred speech, [ ]  Temporary blindness; [ ]  weakness in arms or legs, [ ]  Hoarseness Cardiac: [ ]  Chest pain/pressure, [ ]  Shortness of breath at rest [ ]  Shortness of breath with exertion, [ ]  Atrial fibrillation or irregular heartbeat Vascular: [ ]  Pain in legs with walking, [ ]  Pain in legs at rest, [ ]  Pain in legs at night,  [ ]  Non-healing ulcer, [ ]  Blood clot in vein/DVT,  history of DVT Pulmonary: [ ]  Home oxygen, [ ]  Productive cough, [ ]  Coughing up blood, [ ]  Asthma,  [ ]  Wheezing Musculoskeletal:  [ ]  Arthritis, [ ]  Low back pain, [ ]  Joint pain Hematologic: [ ]  Easy Bruising, [ ]  Anemia; [ ]  Hepatitis Gastrointestinal: [ ]  Blood in stool, [ ]  Gastroesophageal Reflux/heartburn, [ ]  Trouble swallowing Urinary: [ ]  chronic Kidney disease, [ ]  on HD - [ ]  MWF or [ ]  TTHS, [ ]  Burning with urination, [ ]  Difficulty urinating Skin: [ ]  Rashes, [ ]  Wounds Psychological: [ ]  Anxiety, [ ]  Depression   Social History History  Substance Use Topics  . Smoking status: Former Smoker    Types: Cigarettes    Start date: 09/09/2010  . Smokeless tobacco: Former Neurosurgeon  . Alcohol Use: No    Family History Family History  Problem Relation Age of Onset  . Heart disease  Mother   . Cancer Mother   . Cancer Father     Allergies  Allergen Reactions  . Penicillins Swelling    Current Outpatient Prescriptions  Medication Sig Dispense Refill  . fenofibrate (TRICOR) 145 MG tablet Take 145 mg by mouth daily.        Marland Kitchen lisinopril-hydrochlorothiazide (PRINZIDE,ZESTORETIC) 20-25 MG per tablet Take 1 tablet by mouth daily.        Marland Kitchen PLAVIX 75 MG tablet TAKE 1 TABLET DAILY  90 each  3  . rosuvastatin (CRESTOR) 10 MG tablet Take 10 mg by mouth daily.        . Tamsulosin HCl (FLOMAX) 0.4 MG CAPS Take by mouth.          Physical Examination  Filed Vitals:   06/27/12 1520  BP: 124/77  Pulse: 69  Resp: 16    Body mass index is 26.29 kg/(m^2).  General:  WDWN in NAD Gait: Normal HEENT: WNL Eyes: Pupils equal Pulmonary: normal non-labored breathing , without Rales, rhonchi,  wheezing Cardiac: RRR, without  Murmurs, rubs or gallops; No carotid bruits Abdomen: soft, NT, no masses Skin: no rashes, ulcers noted Vascular Exam/Pulses: Palpable PT and DP pulses bilaterally, palpable femoral pulses, 2+ radial pulses  Extremities without ischemic changes, no Gangrene , no cellulitis; no open wounds;  Musculoskeletal: no muscle wasting  or atrophy  Neurologic: A&O X 3; Appropriate Affect ; SENSATION: normal; MOTOR FUNCTION:  moving all extremities equally. Speech is fluent/normal  Non-Invasive Vascular Imaging: ABIs today are 1.0 and triphasic on the right, 0.94 and triphasic on the left which is a slight decrease from previous exam February 2013 duplex shows a patent left SFA stent with slight elevations of 246-236 at the very proximal end of the stent  ASSESSMENT/PLAN: Asymptomatic patient doing well with no signs of claudication. The patient will followup in one year with repeat duplex and ABIs. The patient's questions were encouraged and answered, he is in agreement with this plan.  Lauree Chandler ANP  Clinic M.D.: [facility] [place] on call

## 2012-06-27 NOTE — Progress Notes (Signed)
ANkle brachial index performed @ [facility] 06/27/2012

## 2012-06-28 ENCOUNTER — Encounter: Payer: Self-pay | Admitting: Neurosurgery

## 2012-06-28 NOTE — Addendum Note (Signed)
Addended by: Sharee Pimple on: 06/28/2012 11:05 AM   Modules accepted: Orders

## 2013-02-27 ENCOUNTER — Other Ambulatory Visit: Payer: Self-pay | Admitting: Physician Assistant

## 2013-07-01 ENCOUNTER — Ambulatory Visit: Payer: PRIVATE HEALTH INSURANCE | Admitting: Neurosurgery

## 2013-07-02 ENCOUNTER — Ambulatory Visit (INDEPENDENT_AMBULATORY_CARE_PROVIDER_SITE_OTHER): Payer: 59 | Admitting: Family

## 2013-07-02 ENCOUNTER — Encounter: Payer: Self-pay | Admitting: Family

## 2013-07-02 ENCOUNTER — Encounter (INDEPENDENT_AMBULATORY_CARE_PROVIDER_SITE_OTHER): Payer: 59 | Admitting: *Deleted

## 2013-07-02 VITALS — BP 125/85 | HR 68 | Resp 16 | Ht 71.0 in | Wt 192.0 lb

## 2013-07-02 DIAGNOSIS — Z48812 Encounter for surgical aftercare following surgery on the circulatory system: Secondary | ICD-10-CM

## 2013-07-02 DIAGNOSIS — M7989 Other specified soft tissue disorders: Secondary | ICD-10-CM

## 2013-07-02 DIAGNOSIS — I739 Peripheral vascular disease, unspecified: Secondary | ICD-10-CM

## 2013-07-02 NOTE — Progress Notes (Signed)
[facility] [place] HISTORY AND PHYSICAL -PAD  History of Present Illness Tommy [name] is a 62 y.o. male patient of Dr. Myra Gianotti status post left SFA stent October 2009 with PTA and stent in February 2012.  Pt. denies claudication in legs, he denies any stroke or TIA symptoms.  He reports night cramping in legs which he attributes to Crestor. Patient denies non healing ulcers on lower extremities, denies New Medical or Surgical History.  Pt Diabetic: No Pt smoker: former smoker, quit 1 1/2 years ago  Pt meds include: Statin :Yes Betablocker: No ASA: Yes Other anticoagulants/antiplatelets: Plavix  Past Medical History  Diagnosis Date  . Peripheral vascular disease     Social History History  Substance Use Topics  . Smoking status: Former Smoker    Types: Cigarettes    Start date: 09/09/2010  . Smokeless tobacco: Former Neurosurgeon  . Alcohol Use: No    Family History Family History  Problem Relation Age of Onset  . Heart disease Mother   . Cancer Mother   . Deep vein thrombosis Mother   . Diabetes Mother   . Hyperlipidemia Mother   . Hypertension Mother   . Heart attack Mother   . Peripheral vascular disease Mother   . Cancer Father     Past Surgical History  Procedure Laterality Date  . Femoral artery stent  07-2008 and  11-16-10    2nd stent    Allergies  Allergen Reactions  . Penicillins Swelling    Current Outpatient Prescriptions  Medication Sig Dispense Refill  . aspirin 81 MG tablet Take 81 mg by mouth daily.      . fenofibrate (TRICOR) 145 MG tablet Take 145 mg by mouth daily.        Marland Kitchen lisinopril-hydrochlorothiazide (PRINZIDE,ZESTORETIC) 20-25 MG per tablet Take 1 tablet by mouth daily.        Marland Kitchen PLAVIX 75 MG tablet TAKE 1 TABLET DAILY  90 each  3  . rosuvastatin (CRESTOR) 10 MG tablet Take 10 mg by mouth daily.        . tamsulosin (FLOMAX) 0.4 MG CAPS TAKE (1) CAPSULE DAILY  30 capsule  1   No current facility-administered  medications for this visit.    ROS: [x]  Positive   [ ]  Denies  General:[ ]  Weight loss,  [ ]  Weight gain, [ ]  Fever, [ ]  chills Neurologic: [ ]  Dizziness, [ ]  Blackouts, [ ]  Seizure [ ]  Stroke, [ ]  "Mini stroke", [ ]  Slurred speech, [ ]  Temporary blindness;  [ ] weakness, [ ]  Hoarseness Cardiac: [ ]  Chest pain/pressure, [ ]  Shortness of breath at rest [ ]  Shortness of breath with exertion,  [ ]   Atrial fibrillation or irregular heartbeat Vascular:[ ]  Pain in legs with walking, [ ]  Pain in legs at rest ,[ ]  Pain in legs at night,  [ ]   Non-healing ulcer, [ ]  Blood clot in vein/DVT,   Pulmonary: [ ]  Home oxygen, [ ]   Productive cough, [ ]  Coughing up blood,  [ ]  Asthma,  [ ]  Wheezing Musculoskeletal:  [ ]  Arthritis, [ ]  Low back pain,  [ ]  Joint pain Hematologic:[ ]  Easy Bruising, [ ]  Anemia; [ ]  Hepatitis Gastrointestinal: [ ]  Blood in stool,  [ ]  Gastroesophageal Reflux, [ ]  Trouble swallowing Urinary: [ ]  chronic Kidney disease, [ ]  on HD, [ ]  Burning with urination, [ ]  Frequent urination, [ ]  Difficulty urinating;  Skin: [ ]  Rashes, [ ]  Wounds  Physical Examination  Filed Vitals:   07/02/13 1506  BP: 125/85  Pulse: 68  Resp: 16   Filed Weights   07/02/13 1506  Weight: 192 lb (87.091 kg)   Body mass index is 26.79 kg/(m^2).   General: A&O x 3, WDWN,  Gait: normal Eyes: PERRLA, Pulmonary: CTAB, without wheezes , rales or rhonchi Cardiac: regular Rythm , without murmur          Carotid Bruits Left Right   Negative Negative      Radial pulses palpable bilaterally at 2+ Aorta is not palpable                       VASCULAR EXAM: Extremities without ischemic changes  without Gangrene; without open wounds.                                                                                                          LE Pulses LEFT RIGHT       POPLITEAL  not palpable   not palpable       POSTERIOR TIBIAL   palpable    palpable        DORSALIS PEDIS      ANTERIOR TIBIAL   palpable   palpable    Abdomen: soft, NT, no masses Skin: no rashes, no ulcers noted Musculoskeletal: no muscle wasting or atrophy  Neurologic: A&O X 3; Appropriate Affect ; SENSATION: normal; MOTOR FUNCTION:  moving all extremities equally. Speech is fluent/normal. CN 2-12 intact.    Non-Invasive Vascular Imaging: DATE: 07/02/2013 ABI: RIGHT 1.17, Waveforms: triphasic;  LEFT 1.21, Waveforms: triphasic DUPLEX SCAN OF BYPASS: Patent left SFA stent   ASSESSMENT: Tommy [name] is a 62 y.o. male who is status post left SFA stent October 2009 with PTA and stent in February 2012, is asymptomatic, presents for scheduled Duplex and ABI surveillance which are normal with no evidence of restenosis of left SFA stent.   PLAN:  I discussed in depth with the patient the nature of atherosclerosis, and emphasized the importance of maximal medical management including strict control of blood pressure, blood glucose, and lipid levels, obtaining regular exercise, and continued cessation of smoking.  The patient is aware that without maximal medical management the underlying atherosclerotic disease process will progress, limiting the benefit of any interventions. Based on the patient's vascular studies and examination, pt will return to clinic in 1 year for bilateral LE Duplex and ABI's.  The patient was given information about PAD including signs, symptoms, treatment, what symptoms should prompt the patient to seek immediate medical care, and risk reduction measures to take.  Charisse March, RN, MSN, FNP-C Office Phone: [phone]  Clinic MD: Hart [place]  07/02/2013 3:40 PM

## 2013-07-02 NOTE — Patient Instructions (Signed)
Peripheral Vascular Disease Peripheral Vascular Disease (PVD), also called Peripheral Arterial Disease (PAD), is a circulation problem caused by cholesterol (atherosclerotic plaque) deposits in the arteries. PVD commonly occurs in the lower extremities (legs) but it can occur in other areas of the body, such as your arms. The cholesterol buildup in the arteries reduces blood flow which can cause pain and other serious problems. The presence of PVD can place a person at risk for Coronary Artery Disease (CAD).  CAUSES  Causes of PVD can be many. It is usually associated with more than one risk factor such as:   High Cholesterol.  Smoking.  Diabetes.  Lack of exercise or inactivity.  High blood pressure (hypertension).  Obesity.  Family history. SYMPTOMS   When the lower extremities are affected, patients with PVD may experience:  Leg pain with exertion or physical activity. This is called INTERMITTENT CLAUDICATION. This may present as cramping or numbness with physical activity. The location of the pain is associated with the level of blockage. For example, blockage at the abdominal level (distal abdominal aorta) may result in buttock or hip pain. Lower leg arterial blockage may result in calf pain.  As PVD becomes more severe, pain can develop with less physical activity.  In people with severe PVD, leg pain may occur at rest.  Other PVD signs and symptoms:  Leg numbness or weakness.  Coldness in the affected leg or foot, especially when compared to the other leg.  A change in leg color.  Patients with significant PVD are more prone to ulcers or sores on toes, feet or legs. These may take longer to heal or may reoccur. The ulcers or sores can become infected.  If signs and symptoms of PVD are ignored, gangrene may occur. This can result in the loss of toes or loss of an entire limb.  Not all leg pain is related to PVD. Other medical conditions can cause leg pain such  as:  Blood clots (embolism) or Deep Vein Thrombosis.  Inflammation of the blood vessels (vasculitis).  Spinal stenosis. DIAGNOSIS  Diagnosis of PVD can involve several different types of tests. These can include:  Pulse Volume Recording Method (PVR). This test is simple, painless and does not involve the use of X-rays. PVR involves measuring and comparing the blood pressure in the arms and legs. An ABI (Ankle-Brachial Index) is calculated. The normal ratio of blood pressures is 1. As this number becomes smaller, it indicates more severe disease.  < 0.95  indicates significant narrowing in one or more leg vessels.  <0.8 there will usually be pain in the foot, leg or buttock with exercise.  <0.4 will usually have pain in the legs at rest.  <0.25  usually indicates limb threatening PVD.  Doppler detection of pulses in the legs. This test is painless and checks to see if you have a pulses in your legs/feet.  A dye or contrast material (a substance that highlights the blood vessels so they show up on x-ray) may be given to help your caregiver better see the arteries for the following tests. The dye is eliminated from your body by the kidney's. Your caregiver may order blood work to check your kidney function and other laboratory values before the following tests are performed:  Magnetic Resonance Angiography (MRA). An MRA is a picture study of the blood vessels and arteries. The MRA machine uses a large magnet to produce images of the blood vessels.  Computed Tomography Angiography (CTA). A CTA is a   specialized x-ray that looks at how the blood flows in your blood vessels. An IV may be inserted into your arm so contrast dye can be injected.  Angiogram. Is a procedure that uses x-rays to look at your blood vessels. This procedure is minimally invasive, meaning a small incision (cut) is made in your groin. A small tube (catheter) is then inserted into the artery of your groin. The catheter is  guided to the blood vessel or artery your caregiver wants to examine. Contrast dye is injected into the catheter. X-rays are then taken of the blood vessel or artery. After the images are obtained, the catheter is taken out. TREATMENT  Treatment of PVD involves many interventions which may include:  Lifestyle changes:  Quitting smoking.  Exercise.  Following a low fat, low cholesterol diet.  Control of diabetes.  Foot care is very important to the PVD patient. Good foot care can help prevent infection.  Medication:  Cholesterol-lowering medicine.  Blood pressure medicine.  Anti-platelet drugs.  Certain medicines may reduce symptoms of Intermittent Claudication.  Interventional/Surgical options:  Angioplasty. An Angioplasty is a procedure that inflates a balloon in the blocked artery. This opens the blocked artery to improve blood flow.  Stent Implant. A wire mesh tube (stent) is placed in the artery. The stent expands and stays in place, allowing the artery to remain open.  Peripheral Bypass Surgery. This is a surgical procedure that reroutes the blood around a blocked artery to help improve blood flow. This type of procedure may be performed if Angioplasty or stent implants are not an option. SEEK IMMEDIATE MEDICAL CARE IF:   You develop pain or numbness in your arms or legs.  Your arm or leg turns cold, becomes blue in color.  You develop redness, warmth, swelling and pain in your arms or legs. MAKE SURE YOU:   Understand these instructions.  Will watch your condition.  Will get help right away if you are not doing well or get worse. Document Released: 11/03/2004 Document Revised: 12/19/2011 Document Reviewed: 09/30/2008 Exit[employer] Patient Information 2014 [employer].  

## 2013-07-03 NOTE — Addendum Note (Signed)
Addended by: Sharee Pimple on: 07/03/2013 09:14 AM   Modules accepted: Orders

## 2013-07-08 ENCOUNTER — Ambulatory Visit: Payer: PRIVATE HEALTH INSURANCE | Admitting: Family

## 2013-07-08 ENCOUNTER — Ambulatory Visit: Payer: PRIVATE HEALTH INSURANCE | Admitting: Neurosurgery

## 2013-07-11 ENCOUNTER — Other Ambulatory Visit: Payer: Self-pay | Admitting: *Deleted

## 2013-07-11 DIAGNOSIS — I4891 Unspecified atrial fibrillation: Secondary | ICD-10-CM

## 2013-07-11 MED ORDER — LISINOPRIL-HYDROCHLOROTHIAZIDE 20-25 MG PO TABS: 1.0000 | ORAL_TABLET | Freq: Every day | ORAL | Status: DC

## 2013-07-11 MED ORDER — FENOFIBRATE 145 MG PO TABS: 145.0000 mg | ORAL_TABLET | Freq: Every day | ORAL | Status: DC

## 2013-07-11 MED ORDER — ROSUVASTATIN CALCIUM 10 MG PO TABS: 10.0000 mg | ORAL_TABLET | Freq: Every day | ORAL | Status: DC

## 2013-07-11 MED ORDER — CLOPIDOGREL BISULFATE 75 MG PO TABS: ORAL_TABLET | ORAL | Status: DC

## 2013-08-08 ENCOUNTER — Encounter: Payer: Self-pay | Admitting: Family Medicine

## 2013-08-08 ENCOUNTER — Ambulatory Visit (INDEPENDENT_AMBULATORY_CARE_PROVIDER_SITE_OTHER): Payer: 59 | Admitting: Family Medicine

## 2013-08-08 ENCOUNTER — Encounter (INDEPENDENT_AMBULATORY_CARE_PROVIDER_SITE_OTHER): Payer: Self-pay

## 2013-08-08 VITALS — BP 107/69 | HR 65 | Temp 97.6°F | Ht 71.5 in | Wt 193.6 lb

## 2013-08-08 DIAGNOSIS — Z23 Encounter for immunization: Secondary | ICD-10-CM

## 2013-08-08 DIAGNOSIS — I4891 Unspecified atrial fibrillation: Secondary | ICD-10-CM

## 2013-08-08 DIAGNOSIS — Z0000 Encounter for general adult medical examination without abnormal findings: Secondary | ICD-10-CM

## 2013-08-08 LAB — POCT CBC
Granulocyte percent: 52.1 %G (ref 37–80)
HCT, POC: 39.8 % — AB (ref 43.5–53.7)
Hemoglobin: 13.4 g/dL — AB (ref 14.1–18.1)
Lymph, poc: 1.7 (ref 0.6–3.4)
MCH, POC: 28.8 pg (ref 27–31.2)
MCHC: 33.7 g/dL (ref 31.8–35.4)
MCV: 85.5 fL (ref 80–97)
MPV: 7.3 fL (ref 0–99.8)
POC Granulocyte: 2.2 (ref 2–6.9)
POC LYMPH PERCENT: 39.9 %L (ref 10–50)
Platelet Count, POC: 173 10*3/uL (ref 142–424)
RBC: 4.7 M/uL (ref 4.69–6.13)
RDW, POC: 13.1 %
WBC: 4.2 10*3/uL — AB (ref 4.6–10.2)

## 2013-08-08 MED ORDER — CLOPIDOGREL BISULFATE 75 MG PO TABS: ORAL_TABLET | ORAL | Status: DC

## 2013-08-08 MED ORDER — LISINOPRIL-HYDROCHLOROTHIAZIDE 20-25 MG PO TABS: 1.0000 | ORAL_TABLET | Freq: Every day | ORAL | Status: DC

## 2013-08-08 MED ORDER — ROSUVASTATIN CALCIUM 10 MG PO TABS: 10.0000 mg | ORAL_TABLET | Freq: Every day | ORAL | Status: DC

## 2013-08-08 MED ORDER — TAMSULOSIN HCL 0.4 MG PO CAPS: 0.4000 mg | ORAL_CAPSULE | Freq: Every day | ORAL | Status: DC

## 2013-08-08 MED ORDER — FENOFIBRATE 145 MG PO TABS: 145.0000 mg | ORAL_TABLET | Freq: Every day | ORAL | Status: DC

## 2013-08-08 NOTE — Progress Notes (Signed)
  Subjective:    Patient ID: RAIFORD FETTERMAN, male    DOB: 08/20/1951, 62 y.o.   MRN: [id number]  HPI  This 62 y.o. male presents for evaluation of CPE.  He has hx of CAD and has a coronary artery Stent.  He has recently seen Cardiology and has normal w/u.  He is due for annual labs. He has not had zostavax.  Review of Systems No chest pain, SOB, HA, dizziness, vision change, N/V, diarrhea, constipation, dysuria, urinary urgency or frequency, myalgias, arthralgias or rash.     Objective:   Physical Exam Vital signs noted  Well developed well nourished male.  HEENT - Head atraumatic Normocephalic                Eyes - PERRLA, Conjuctiva - clear Sclera- Clear EOMI                Ears - EAC's Wnl TM's Wnl Gross Hearing WNL                Nose - Nares patent                 Throat - oropharanx wnl Respiratory - Lungs CTA bilateral Cardiac - RRR S1 and S2 without murmur GI - Abdomen soft Nontender and bowel sounds active x 4 Extremities - No edema. Neuro - Grossly intact.       Assessment & Plan:    Routine general medical examination at a health care facility - Plan: clopidogrel (PLAVIX) 75 MG tablet, fenofibrate (TRICOR) 145 MG tablet, lisinopril-hydrochlorothiazide (PRINZIDE,ZESTORETIC) 20-25 MG per tablet, rosuvastatin (CRESTOR) 10 MG tablet, tamsulosin (FLOMAX) 0.4 MG CAPS capsule, POCT CBC, CMP14+EGFR, Thyroid Panel With TSH, PSA, total and free, Lipid panel, Varicella-zoster vaccine subcutaneous.  Follow up in 6 months.  Deatra Canter FNP

## 2013-08-08 NOTE — Patient Instructions (Signed)
Herpes Zoster Virus Vaccine What is this medicine? HERPES ZOSTER VIRUS VACCINE (HUR peez ZOS ter vahy ruhs vak SEEN) is a vaccine. It is used to prevent shingles in adults 64 years old and over. This vaccine is not used to treat shingles or nerve pain from shingles. This medicine may be used for other purposes; ask your health care provider or pharmacist if you have questions. What should I tell my health care provider before I take this medicine? They need to know if you have any of these conditions: -cancer like leukemia or lymphoma -immune system problems or therapy -infection with fever -tuberculosis -an unusual or allergic reaction to vaccines, neomycin, gelatin, other medicines, foods, dyes, or preservatives -pregnant or trying to get pregnant -breast-feeding How should I use this medicine? This vaccine is for injection under the skin. It is given by a health care professional. Talk to your pediatrician regarding the use of this medicine in children. This medicine is not approved for use in children. Overdosage: If you think you have taken too much of this medicine contact a poison control center or emergency room at once. NOTE: This medicine is only for you. Do not share this medicine with others. What if I miss a dose? This does not apply. What may interact with this medicine? Do not take this medicine with any of the following medications: -adalimumab -anakinra -etanercept -infliximab -medicines to treat cancer -medicines that suppress your immune system This medicine may also interact with the following medications: -immunoglobulins -steroid medicines like prednisone or cortisone This list may not describe all possible interactions. Give your health care provider a list of all the medicines, herbs, non-prescription drugs, or dietary supplements you use. Also tell them if you smoke, drink alcohol, or use illegal drugs. Some items may interact with your medicine. What should I  watch for while using this medicine? Visit your doctor for regular check ups. This vaccine, like all vaccines, may not fully protect everyone. After receiving this vaccine it may be possible to pass chickenpox infection to others. Avoid people with immune system problems, pregnant women who have not had chickenpox, and newborns of women who have not had chickenpox. Talk to your doctor for more information. What side effects may I notice from receiving this medicine? Side effects that you should report to your doctor or health care professional as soon as possible: -allergic reactions like skin rash, itching or hives, swelling of the face, lips, or tongue -breathing problems -feeling faint or lightheaded, falls -fever, flu-like symptoms -pain, tingling, numbness in the hands or feet -swelling of the ankles, feet, hands -unusually weak or tired Side effects that usually do not require medical attention (report to your doctor or health care professional if they continue or are bothersome): -aches or pains -chickenpox-like rash -diarrhea -headache -loss of appetite -nausea, vomiting -redness, pain, swelling at site where injected -runny nose This list may not describe all possible side effects. Call your doctor for medical advice about side effects. You may report side effects to FDA at 1-800-FDA-1088. Where should I keep my medicine? This drug is given in a hospital or clinic and will not be stored at home. NOTE: This sheet is a summary. It may not cover all possible information. If you have questions about this medicine, talk to your doctor, pharmacist, or health care provider.  2013, [employer]. (03/15/2010 5:43:50 PM)

## 2013-08-09 ENCOUNTER — Other Ambulatory Visit: Payer: Self-pay | Admitting: Family Medicine

## 2013-08-09 DIAGNOSIS — R972 Elevated prostate specific antigen [PSA]: Secondary | ICD-10-CM

## 2013-08-09 LAB — CMP14+EGFR
ALT: 28 IU/L (ref 0–44)
AST: 23 IU/L (ref 0–40)
Albumin/Globulin Ratio: 2.3 (ref 1.1–2.5)
Albumin: 4.6 g/dL (ref 3.6–4.8)
Alkaline Phosphatase: 33 IU/L — ABNORMAL LOW (ref 39–117)
BUN/Creatinine Ratio: 19 (ref 10–22)
BUN: 22 mg/dL (ref 8–27)
CO2: 28 mmol/L (ref 18–29)
Calcium: 10.7 mg/dL — ABNORMAL HIGH (ref 8.6–10.2)
Chloride: 104 mmol/L (ref 97–108)
Creatinine, Ser: 1.16 mg/dL (ref 0.76–1.27)
GFR calc Af Amer: 77 mL/min/{1.73_m2} (ref >59)
GFR calc non Af Amer: 66 mL/min/{1.73_m2} (ref >59)
Globulin, Total: 2 g/dL (ref 1.5–4.5)
Glucose: 93 mg/dL (ref 65–99)
Potassium: 5.1 mmol/L (ref 3.5–5.2)
Sodium: 145 mmol/L — ABNORMAL HIGH (ref 134–144)
Total Bilirubin: 0.7 mg/dL (ref 0.0–1.2)
Total Protein: 6.6 g/dL (ref 6.0–8.5)

## 2013-08-09 LAB — THYROID PANEL WITH TSH
Free Thyroxine Index: 2.3 (ref 1.2–4.9)
T3 Uptake Ratio: 29 % (ref 24–39)
T4, Total: 7.8 ug/dL (ref 4.5–12.0)
TSH: 0.581 u[IU]/mL (ref 0.450–4.500)

## 2013-08-09 LAB — LIPID PANEL
Chol/HDL Ratio: 3.4 ratio units (ref 0.0–5.0)
Cholesterol, Total: 129 mg/dL (ref 100–199)
HDL: 38 mg/dL — ABNORMAL LOW (ref >39)
LDL Calculated: 70 mg/dL (ref 0–99)
Triglycerides: 105 mg/dL (ref 0–149)
VLDL Cholesterol Cal: 21 mg/dL (ref 5–40)

## 2013-08-09 LAB — PSA, TOTAL AND FREE
PSA, Free Pct: 27.8 %
PSA, Free: 1.39 ng/mL
PSA: 5 ng/mL — ABNORMAL HIGH (ref 0.0–4.0)

## 2013-08-30 ENCOUNTER — Other Ambulatory Visit (INDEPENDENT_AMBULATORY_CARE_PROVIDER_SITE_OTHER): Payer: 59

## 2013-08-30 DIAGNOSIS — R7989 Other specified abnormal findings of blood chemistry: Secondary | ICD-10-CM

## 2013-08-31 LAB — PTH, INTACT AND CALCIUM
Calcium: 10.1 mg/dL (ref 8.6–10.2)
PTH: 27 pg/mL (ref 15–65)

## 2013-09-12 ENCOUNTER — Telehealth: Payer: Self-pay | Admitting: *Deleted

## 2013-09-12 NOTE — Telephone Encounter (Signed)
Results given to pt

## 2013-11-04 ENCOUNTER — Ambulatory Visit (INDEPENDENT_AMBULATORY_CARE_PROVIDER_SITE_OTHER): Payer: 59 | Admitting: General Practice

## 2013-11-04 ENCOUNTER — Encounter: Payer: Self-pay | Admitting: General Practice

## 2013-11-04 VITALS — BP 125/84 | HR 80 | Temp 98.3°F | Ht 71.5 in | Wt 193.5 lb

## 2013-11-04 DIAGNOSIS — J019 Acute sinusitis, unspecified: Secondary | ICD-10-CM

## 2013-11-04 MED ORDER — AZITHROMYCIN 250 MG PO TABS: ORAL_TABLET | ORAL | Status: DC

## 2013-11-04 NOTE — Progress Notes (Signed)
   Subjective:    Patient ID: Tommy [name], male    DOB: 11-20-1950, 63 y.o.   MRN: [id number]  Sinusitis This is a new problem. The current episode started in the past 7 days. The problem has been gradually worsening since onset. There has been no fever. Associated symptoms include congestion, coughing and sinus pressure. Pertinent negatives include no chills, shortness of breath or sore throat. Past treatments include oral decongestants. The treatment provided mild relief.      Review of Systems  Constitutional: Negative for fever and chills.  HENT: Positive for congestion and sinus pressure. Negative for postnasal drip, rhinorrhea and sore throat.   Respiratory: Positive for cough. Negative for chest tightness and shortness of breath.   Cardiovascular: Negative for chest pain.  All other systems reviewed and are negative.       Objective:   Physical Exam  Constitutional: He is oriented to person, place, and time. He appears well-developed and well-nourished.  HENT:  Head: Normocephalic and atraumatic.  Right Ear: External ear normal.  Left Ear: External ear normal.  Nose: Right sinus exhibits maxillary sinus tenderness and frontal sinus tenderness. Left sinus exhibits maxillary sinus tenderness and frontal sinus tenderness.  Mouth/Throat: Oropharynx is clear and moist.  Cardiovascular: Normal rate, regular rhythm and normal heart sounds.   Pulmonary/Chest: Effort normal and breath sounds normal. No respiratory distress. He exhibits no tenderness.  Neurological: He is alert and oriented to person, place, and time.  Skin: Skin is warm and dry.  Psychiatric: He has a normal mood and affect.          Assessment & Plan:  1. Sinusitis, acute - azithromycin (ZITHROMAX) 250 MG tablet; Take as directed  Dispense: 6 tablet; Refill: 0 -increase fluids -RTO if symptoms worsen or unresolved -Patient verbalized understanding Erby Pian, FNP-C

## 2013-11-04 NOTE — Patient Instructions (Signed)

## 2014-02-17 ENCOUNTER — Telehealth: Payer: Self-pay | Admitting: Family Medicine

## 2014-02-17 NOTE — Telephone Encounter (Signed)
appt given for wed with Bill 

## 2014-02-19 ENCOUNTER — Ambulatory Visit (INDEPENDENT_AMBULATORY_CARE_PROVIDER_SITE_OTHER): Payer: 59 | Admitting: Family Medicine

## 2014-02-19 VITALS — BP 148/85 | HR 72 | Temp 97.5°F | Ht 71.5 in | Wt 194.4 lb

## 2014-02-19 DIAGNOSIS — E162 Hypoglycemia, unspecified: Secondary | ICD-10-CM

## 2014-02-19 DIAGNOSIS — R5383 Other fatigue: Principal | ICD-10-CM

## 2014-02-19 DIAGNOSIS — R5381 Other malaise: Secondary | ICD-10-CM

## 2014-02-19 LAB — POCT CBC
Granulocyte percent: 51 %G (ref 37–80)
HCT, POC: 39.7 % — AB (ref 43.5–53.7)
Hemoglobin: 13.1 g/dL — AB (ref 14.1–18.1)
Lymph, poc: 1.7 (ref 0.6–3.4)
MCH, POC: 28.9 pg (ref 27–31.2)
MCHC: 33.1 g/dL (ref 31.8–35.4)
MCV: 87.1 fL (ref 80–97)
MPV: 8.4 fL (ref 0–99.8)
POC Granulocyte: 1.9 — AB (ref 2–6.9)
POC LYMPH PERCENT: 43.6 %L (ref 10–50)
Platelet Count, POC: 171 10*3/uL (ref 142–424)
RBC: 4.6 M/uL — AB (ref 4.69–6.13)
RDW, POC: 12.5 %
WBC: 3.8 10*3/uL — AB (ref 4.6–10.2)

## 2014-02-19 NOTE — Progress Notes (Signed)
   Subjective:    Patient ID: Tommy [name], male    DOB: Dec 07, 1950, 63 y.o.   MRN: [id number]  HPI This 63 y.o. male presents for evaluation of syncope. He was mowing the lawn and his hands became Shaky and then he released the lawn mower and then he awoke on the grass and got up and went into The house and he ate and then felt better and then went out to resume mowing and has not had anymore episodes.   Review of Systems    No chest pain, SOB, HA, dizziness, vision change, N/V, diarrhea, constipation, dysuria, urinary urgency or frequency, myalgias, arthralgias or rash.  Objective:   Physical Exam  Vital signs noted  Well developed well nourished male.  HEENT - Head atraumatic Normocephalic                Eyes - PERRLA, Conjuctiva - clear Sclera- Clear EOMI                Ears - EAC's Wnl TM's Wnl Gross Hearing WNL                Throat - oropharanx wnl Respiratory - Lungs CTA bilateral Cardiac - RRR S1 and S2 without murmur GI - Abdomen soft Nontender and bowel sounds active x 4 Extremities - No edema. Neuro - Grossly intact.      Assessment & Plan:  Other malaise and fatigue - Plan: POCT CBC, CMP14+EGFR  Hypoglycemia - Plan: POCT CBC, CMP14+EGFR  Discussed at length that patient needs to eat low carb diet and spread meals out in small  Frequent meals all day.  Lysbeth Penner FNP

## 2014-02-20 LAB — CMP14+EGFR
ALT: 32 IU/L (ref 0–44)
AST: 25 IU/L (ref 0–40)
Albumin/Globulin Ratio: 2.3 (ref 1.1–2.5)
Albumin: 4.5 g/dL (ref 3.6–4.8)
Alkaline Phosphatase: 30 IU/L — ABNORMAL LOW (ref 39–117)
BUN/Creatinine Ratio: 15 (ref 10–22)
BUN: 19 mg/dL (ref 8–27)
CO2: 27 mmol/L (ref 18–29)
Calcium: 10.5 mg/dL — ABNORMAL HIGH (ref 8.6–10.2)
Chloride: 103 mmol/L (ref 97–108)
Creatinine, Ser: 1.28 mg/dL — ABNORMAL HIGH (ref 0.76–1.27)
GFR calc Af Amer: 67 mL/min/{1.73_m2} (ref >59)
GFR calc non Af Amer: 58 mL/min/{1.73_m2} — ABNORMAL LOW (ref >59)
Globulin, Total: 2 g/dL (ref 1.5–4.5)
Glucose: 100 mg/dL — ABNORMAL HIGH (ref 65–99)
Potassium: 4.7 mmol/L (ref 3.5–5.2)
Sodium: 142 mmol/L (ref 134–144)
Total Bilirubin: 0.6 mg/dL (ref 0.0–1.2)
Total Protein: 6.5 g/dL (ref 6.0–8.5)

## 2014-04-24 ENCOUNTER — Telehealth: Payer: Self-pay | Admitting: Family Medicine

## 2014-04-24 NOTE — Telephone Encounter (Signed)
appt given with Tommy [name]

## 2014-04-25 ENCOUNTER — Ambulatory Visit: Payer: 59 | Admitting: General Practice

## 2014-05-19 ENCOUNTER — Other Ambulatory Visit: Payer: Self-pay | Admitting: Dermatology

## 2014-07-02 ENCOUNTER — Encounter (HOSPITAL_COMMUNITY): Payer: 59

## 2014-07-02 ENCOUNTER — Ambulatory Visit: Payer: 59 | Admitting: Family

## 2014-07-02 ENCOUNTER — Other Ambulatory Visit (HOSPITAL_COMMUNITY): Payer: 59

## 2014-07-07 ENCOUNTER — Other Ambulatory Visit (HOSPITAL_COMMUNITY): Payer: 59

## 2014-07-07 ENCOUNTER — Encounter (HOSPITAL_COMMUNITY): Payer: 59

## 2014-07-07 ENCOUNTER — Ambulatory Visit: Payer: 59 | Admitting: Family

## 2014-07-17 ENCOUNTER — Telehealth: Payer: Self-pay | Admitting: Family

## 2014-07-17 NOTE — Telephone Encounter (Signed)
appt scheduled for 8am with moore

## 2014-07-18 ENCOUNTER — Ambulatory Visit (INDEPENDENT_AMBULATORY_CARE_PROVIDER_SITE_OTHER): Payer: 59 | Admitting: Family Medicine

## 2014-07-18 ENCOUNTER — Encounter: Payer: Self-pay | Admitting: Family Medicine

## 2014-07-18 VITALS — BP 110/71 | HR 71 | Temp 97.2°F | Ht 71.5 in | Wt 194.0 lb

## 2014-07-18 DIAGNOSIS — H6123 Impacted cerumen, bilateral: Secondary | ICD-10-CM

## 2014-07-18 DIAGNOSIS — J019 Acute sinusitis, unspecified: Secondary | ICD-10-CM

## 2014-07-18 DIAGNOSIS — J029 Acute pharyngitis, unspecified: Secondary | ICD-10-CM

## 2014-07-18 MED ORDER — AZITHROMYCIN 250 MG PO TABS: ORAL_TABLET | ORAL | Status: DC

## 2014-07-18 NOTE — Patient Instructions (Signed)
Drink plenty of fluids Continue to use Flonase 1-2 sprays each nostril daily Use saline nose spray frequently during the day Take Mucinex over-the-counter, blue and white in color, 1 twice daily with a large glass of water for cough and congestion Take antibiotic as directed He can use Debrox over-the-counter to help soften ear wax

## 2014-07-18 NOTE — Progress Notes (Addendum)
Subjective:    Patient ID: Tommy [name], male    DOB: 20-May-1951, 62 y.o.   MRN: [id number]  HPI Patient here today for sinus trouble that started about 5 days ago. The patient has had headaches, congestion, and sore throat . He denies fever or chest congestion. The headache has been frontal in nature.       Patient Active Problem List   Diagnosis Date Noted  . Peripheral vascular disease, unspecified 06/27/2012  . BPH (benign prostatic hypertrophy) 02/09/2011  . PAD (peripheral artery disease) 02/09/2011  . HTN (hypertension) 02/09/2011  . Hyperlipidemia 02/09/2011   Outpatient Encounter Prescriptions as of 07/18/2014  Medication Sig  . aspirin 81 MG tablet Take 81 mg by mouth daily.  . clopidogrel (PLAVIX) 75 MG tablet TAKE 1 TABLET DAILY  . fenofibrate (TRICOR) 145 MG tablet Take 1 tablet (145 mg total) by mouth daily.  Marland Kitchen lisinopril-hydrochlorothiazide (PRINZIDE,ZESTORETIC) 20-25 MG per tablet Take 1 tablet by mouth daily.  . rosuvastatin (CRESTOR) 10 MG tablet Take 1 tablet (10 mg total) by mouth daily.  . tamsulosin (FLOMAX) 0.4 MG CAPS capsule Take 1 capsule (0.4 mg total) by mouth daily.    Review of Systems  Constitutional: Negative.   HENT: Positive for postnasal drip, sinus pressure and sore throat.   Eyes: Negative.   Respiratory: Negative.   Cardiovascular: Negative.   Gastrointestinal: Negative.   Endocrine: Negative.   Genitourinary: Negative.   Musculoskeletal: Negative.   Skin: Negative.   Allergic/Immunologic: Negative.   Neurological: Positive for headaches.  Hematological: Negative.   Psychiatric/Behavioral: Negative.        Objective:   Physical Exam  Nursing note and vitals reviewed. Constitutional: He is oriented to person, place, and time. He appears well-developed and well-nourished. No distress.  HENT:  Head: Normocephalic and atraumatic.  Right Ear: External ear normal.  Left Ear: External ear normal.  Mouth/Throat: Oropharynx is clear  and moist. No oropharyngeal exudate.  The patient has frontal sinus tenderness and nasal turbinate congestion and swelling left greater than right  Eyes: Conjunctivae and EOM are normal. Pupils are equal, round, and reactive to light. Right eye exhibits no discharge. Left eye exhibits no discharge. No scleral icterus.  Neck: Normal range of motion. Neck supple. No thyromegaly present.  Cardiovascular: Normal rate and regular rhythm.   No murmur heard. Pulmonary/Chest: Effort normal and breath sounds normal. No respiratory distress. He has no wheezes. He has no rales. He exhibits no tenderness.  There is a dry cough with minimal congestion  Abdominal: Soft. Bowel sounds are normal. He exhibits no mass. There is no tenderness. There is no rebound and no guarding.  Musculoskeletal: Normal range of motion. He exhibits no edema.  Lymphadenopathy:    He has no cervical adenopathy.  Neurological: He is alert and oriented to person, place, and time.  Skin: Skin is warm and dry. No rash noted.  Psychiatric: He has a normal mood and affect. His behavior is normal. Judgment and thought content normal.   BP 110/71  Pulse 71  Temp(Src) 97.2 F (36.2 C) (Oral)  Ht 5' 11.5" (1.816 m)  Wt 194 lb (87.998 kg)  BMI 26.68 kg/m2         Assessment & Plan:  1. Sore throat - POCT rapid strep A - Strep A culture, throat - azithromycin (ZITHROMAX) 250 MG tablet; 2 pills initially then 1 daily for infection until completed  Dispense: 6 tablet; Refill: 0  2. Acute rhinosinusitis - azithromycin (ZITHROMAX)  250 MG tablet; 2 pills initially then 1 daily for infection until completed  Dispense: 6 tablet; Refill: 0  3. Impacted cerumen of both ears  Patient Instructions  Drink plenty of fluids Continue to use Flonase 1-2 sprays each nostril daily Use saline nose spray frequently during the day Take Mucinex over-the-counter, blue and white in color, 1 twice daily with a large glass of water for cough and  congestion Take antibiotic as directed He can use Debrox over-the-counter to help soften ear wax   Arrie Senate MD

## 2014-07-21 LAB — STREP A CULTURE, THROAT: STREP A CULTURE: NEGATIVE

## 2014-07-23 ENCOUNTER — Ambulatory Visit (INDEPENDENT_AMBULATORY_CARE_PROVIDER_SITE_OTHER): Payer: 59

## 2014-07-23 DIAGNOSIS — Z23 Encounter for immunization: Secondary | ICD-10-CM

## 2014-08-08 ENCOUNTER — Other Ambulatory Visit: Payer: Self-pay | Admitting: Family Medicine

## 2014-08-13 ENCOUNTER — Other Ambulatory Visit: Payer: Self-pay | Admitting: *Deleted

## 2014-08-13 DIAGNOSIS — I739 Peripheral vascular disease, unspecified: Secondary | ICD-10-CM

## 2014-08-14 ENCOUNTER — Encounter: Payer: Self-pay | Admitting: Family

## 2014-08-15 ENCOUNTER — Ambulatory Visit (INDEPENDENT_AMBULATORY_CARE_PROVIDER_SITE_OTHER): Payer: 59 | Admitting: Family

## 2014-08-15 ENCOUNTER — Encounter (HOSPITAL_COMMUNITY): Payer: 59

## 2014-08-15 ENCOUNTER — Encounter: Payer: Self-pay | Admitting: Family

## 2014-08-15 ENCOUNTER — Ambulatory Visit (HOSPITAL_COMMUNITY)
Admission: RE | Admit: 2014-08-15 | Discharge: 2014-08-15 | Disposition: A | Discharge status: Home / Self Care | Payer: 59 | Source: Ambulatory Visit | Attending: Family | Admitting: Family

## 2014-08-15 ENCOUNTER — Other Ambulatory Visit: Payer: Self-pay | Admitting: *Deleted

## 2014-08-15 VITALS — BP 124/83 | HR 70 | Resp 16 | Ht 71.0 in | Wt 191.0 lb

## 2014-08-15 DIAGNOSIS — Z48812 Encounter for surgical aftercare following surgery on the circulatory system: Secondary | ICD-10-CM

## 2014-08-15 DIAGNOSIS — Z95820 Peripheral vascular angioplasty status with implants and grafts: Secondary | ICD-10-CM

## 2014-08-15 DIAGNOSIS — I739 Peripheral vascular disease, unspecified: Secondary | ICD-10-CM

## 2014-08-15 DIAGNOSIS — Z9889 Other specified postprocedural states: Secondary | ICD-10-CM

## 2014-08-15 NOTE — Patient Instructions (Signed)

## 2014-08-15 NOTE — Progress Notes (Signed)
[facility]ISTORY [facility] PHYSICAL -PAD  History of Present Illness Tommy [name] is a 63 y.o. male patient of Dr. Trula [name] who is status post left SFA stent October 2009 with PTA and stent in February 2012. He returns today for follow up.  Patient denies claudication symptoms with walking, denies non healing ulcers on lower extremities, denies New Medical or Surgical History.  He denies any history of stroke or TIA.  Pt Diabetic: No Pt smoker: former smoker, quit in 2013  Pt meds include: Statin :Yes Betablocker: No ASA: Yes Other anticoagulants/antiplatelets: Plavix    Past Medical History  Diagnosis Date  . Peripheral vascular disease   . Hypertension   . Hyperlipidemia     Social History History  Substance Use Topics  . Smoking status: Former Smoker    Types: Cigarettes    Start date: 09/09/2010  . Smokeless tobacco: Former Systems developer  . Alcohol Use: No    Family History Family History  Problem Relation Age of Onset  . Heart disease Mother   . Cancer Mother   . Deep vein thrombosis Mother   . Diabetes Mother   . Hyperlipidemia Mother   . Hypertension Mother   . Heart attack Mother   . Peripheral vascular disease Mother   . Cancer Father     Past Surgical History  Procedure Laterality Date  . Femoral artery stent  07-2008 and  11-16-10    2nd stent    Allergies  Allergen Reactions  . Penicillins Swelling    Current Outpatient Prescriptions  Medication Sig Dispense Refill  . aspirin 81 MG tablet Take 81 mg by mouth daily.    Marland Kitchen azithromycin (ZITHROMAX) 250 MG tablet 2 pills initially then 1 daily for infection until completed 6 tablet 0  . clopidogrel (PLAVIX) 75 MG tablet TAKE 1 TABLET DAILY 30 tablet 11  . CRESTOR 10 MG tablet TAKE 1 TABLET DAILY 30 tablet 0  . fenofibrate (TRICOR) 145 MG tablet Take 1 tablet (145 mg total) by mouth daily. 30 tablet 11  . lisinopril-hydrochlorothiazide (PRINZIDE,ZESTORETIC) 20-25 MG per tablet  Take 1 tablet by mouth daily. 30 tablet 11  . tamsulosin (FLOMAX) 0.4 MG CAPS capsule TAKE (1) CAPSULE DAILY 30 capsule 0   No current facility-administered medications for this visit.    ROS: See HPI for pertinent positives and negatives.   Physical Examination  Filed Vitals:   08/15/14 1302  BP: 124/83  Pulse: 70  Resp: 16  Height: 5\' 11"  (1.803 m)  Weight: 191 lb (86.637 kg)   Body mass index is 26.65 kg/(m^2).  General: A&O x 3, WDWN Gait: normal Eyes: PERRLA, Pulmonary: CTAB, without wheezes , rales or rhonchi Cardiac: regular Rythm , without detected murmur     Carotid Bruits Left Right   Negative Negative   Radial pulses palpable bilaterally at 2+ Aorta is not palpable   VASCULAR EXAM: Extremities without ischemic changes  without Gangrene; without open wounds.     LE Pulses LEFT RIGHT   POPLITEAL not palpable  not palpable   POSTERIOR TIBIAL  palpable   palpable    DORSALIS PEDIS  ANTERIOR TIBIAL palpable  palpable    Abdomen: soft, NT, no palpated masses Skin: no rashes, no ulcers noted Musculoskeletal: no muscle wasting or atrophy Neurologic: A&O X 3; Appropriate Affect ; SENSATION: normal; MOTOR FUNCTION: moving all extremities equally. Speech is fluent/normal. CN 2-12 intact.   Non-Invasive Vascular Imaging: DATE: 08/15/2014 LOWER EXTREMITY ARTERIAL EVALUATION  INDICATION: Follow up left lower extremity    PREVIOUS INTERVENTION(S): Left superficial femoral artery stent placed 07/22/2008 with transluminal angioplasty with stent placed 11/16/2010    DUPLEX EXAM:     RIGHT  LEFT   Peak Systolic Velocity (cm/s) Ratio (if abnormal) Waveform  Peak Systolic Velocity (cm/s) Ratio (if abnormal) Waveform     Artery - Proximal to  Stent 141  T     Stent - Origin 190  T     Stent - Proximal 166  T     Stent - Mid 107  T     Stent - Distal 155  T      Stent - End 146  T     Artery - Distal to Stent 104  T  1.28 Today's ABI / TBI 1.26  1.17 Previous ABI / TBI (07/02/13  ) 1.21    Waveform:    M - Monophasic       B - Biphasic       T - Triphasic  If Ankle Brachial Index (ABI) or Toe Brachial Index (TBI) performed, please see complete report     ADDITIONAL FINDINGS:     IMPRESSION: 1. Patent left superficial femoral artery stent.    Compared to the previous exam:  Unable to obtain increased velocity.    ASSESSMENT: Tommy [name] is a 63 y.o. male who is status post left SFA stent October 2009 with PTA and stent in February 2012. Today's left LE arterial Duplex reveals a patent left superficial femoral artery stent with triphasic waveforms throughout and normal ABI's bilaterally.   PLAN:  I discussed in depth with the patient the nature of atherosclerosis, and emphasized the importance of maximal medical management including strict control of blood pressure, blood glucose, and lipid levels, obtaining regular exercise, and continued cessation of smoking.  The patient is aware that without maximal medical management the underlying atherosclerotic disease process will progress, limiting the benefit of any interventions.  Based on the patient's vascular studies and examination, pt will return to clinic in 1 year for ABI's and left LE arterial Duplex.  The patient was given information about PAD including signs, symptoms, treatment, what symptoms should prompt the patient to seek immediate medical care, and risk reduction measures to take.  Clemon Chambers, RN, MSN, FNP-C [facility] Phone: [phone]  Clinic MD: Early on call  08/15/2014 1:39 PM

## 2014-08-18 NOTE — Addendum Note (Signed)
Addended by: Mena Goes on: 08/18/2014 12:10 PM   Modules accepted: Orders

## 2014-08-19 ENCOUNTER — Other Ambulatory Visit: Payer: Self-pay | Admitting: Family Medicine

## 2014-08-20 ENCOUNTER — Other Ambulatory Visit: Payer: Self-pay | Admitting: Family Medicine

## 2014-08-20 ENCOUNTER — Encounter: Payer: 59 | Admitting: Family Medicine

## 2014-09-01 ENCOUNTER — Other Ambulatory Visit: Payer: Self-pay | Admitting: Family Medicine

## 2014-09-22 ENCOUNTER — Other Ambulatory Visit: Payer: Self-pay | Admitting: Family Medicine

## 2014-10-07 ENCOUNTER — Ambulatory Visit (INDEPENDENT_AMBULATORY_CARE_PROVIDER_SITE_OTHER): Payer: 59 | Admitting: Family Medicine

## 2014-10-07 VITALS — Temp 97.7°F | Ht 71.0 in | Wt 195.6 lb

## 2014-10-07 DIAGNOSIS — R739 Hyperglycemia, unspecified: Secondary | ICD-10-CM

## 2014-10-07 DIAGNOSIS — R5383 Other fatigue: Secondary | ICD-10-CM

## 2014-10-07 DIAGNOSIS — I10 Essential (primary) hypertension: Secondary | ICD-10-CM

## 2014-10-07 DIAGNOSIS — L723 Sebaceous cyst: Secondary | ICD-10-CM

## 2014-10-07 DIAGNOSIS — Z0000 Encounter for general adult medical examination without abnormal findings: Secondary | ICD-10-CM

## 2014-10-07 DIAGNOSIS — I25810 Atherosclerosis of coronary artery bypass graft(s) without angina pectoris: Secondary | ICD-10-CM

## 2014-10-07 DIAGNOSIS — E785 Hyperlipidemia, unspecified: Secondary | ICD-10-CM

## 2014-10-07 DIAGNOSIS — N400 Enlarged prostate without lower urinary tract symptoms: Secondary | ICD-10-CM

## 2014-10-07 LAB — POCT CBC
Granulocyte percent: 56.3 %G (ref 37–80)
HCT, POC: 42.1 % — AB (ref 43.5–53.7)
Hemoglobin: 13.9 g/dL — AB (ref 14.1–18.1)
Lymph, poc: 1.8 (ref 0.6–3.4)
MCH, POC: 28.5 pg (ref 27–31.2)
MCHC: 32.9 g/dL (ref 31.8–35.4)
MCV: 86.5 fL (ref 80–97)
MPV: 8.5 fL (ref 0–99.8)
POC Granulocyte: 2.6 (ref 2–6.9)
POC LYMPH PERCENT: 38.3 %L (ref 10–50)
Platelet Count, POC: 196 10*3/uL (ref 142–424)
RBC: 4.9 M/uL (ref 4.69–6.13)
RDW, POC: 12.5 %
WBC: 4.6 10*3/uL (ref 4.6–10.2)

## 2014-10-07 LAB — POCT GLYCOSYLATED HEMOGLOBIN (HGB A1C): Hemoglobin A1C: 5.2

## 2014-10-07 MED ORDER — LISINOPRIL-HYDROCHLOROTHIAZIDE 20-25 MG PO TABS: 1.0000 | ORAL_TABLET | Freq: Every day | ORAL | Status: DC

## 2014-10-07 MED ORDER — CLOPIDOGREL BISULFATE 75 MG PO TABS: 75.0000 mg | ORAL_TABLET | Freq: Every day | ORAL | Status: DC

## 2014-10-07 MED ORDER — ROSUVASTATIN CALCIUM 10 MG PO TABS: 10.0000 mg | ORAL_TABLET | Freq: Every day | ORAL | Status: DC

## 2014-10-07 MED ORDER — FENOFIBRATE 145 MG PO TABS: 145.0000 mg | ORAL_TABLET | Freq: Every day | ORAL | Status: DC

## 2014-10-07 MED ORDER — TAMSULOSIN HCL 0.4 MG PO CAPS
ORAL_CAPSULE | ORAL | Status: DC
Start: 2014-10-07 — End: 2015-05-18

## 2014-10-07 NOTE — Progress Notes (Signed)
   Subjective:    Patient ID: Tommy [name], male    DOB: May 06, 1951, 63 y.o.   MRN: [id number]  HPI He is here for CPE.  He is having a sebaceous cyst on his back.  He has been having elevated glucose readings and last night it was 187.  He has hx of CAD and sees Cardiology.  He has coronary artery stent.  He has hx of hyperlipidemia.  He has hx of hypertension.  He is up to date with colonoscopy.  Review of Systems  Constitutional: Negative for fever.  HENT: Negative for ear pain.   Eyes: Negative for discharge.  Respiratory: Negative for cough.   Cardiovascular: Negative for chest pain.  Gastrointestinal: Negative for abdominal distention.  Endocrine: Negative for polyuria.  Genitourinary: Negative for difficulty urinating.  Musculoskeletal: Negative for gait problem and neck pain.  Skin: Negative for color change and rash.  Neurological: Negative for speech difficulty and headaches.  Psychiatric/Behavioral: Negative for agitation.       Objective:    Temp(Src) 97.7 F (36.5 C) (Oral)  Ht $R'5\' 11"'mZ$  (1.803 m)  Wt 195 lb 9.6 oz (88.724 kg)  BMI 27.29 kg/m2   Physical Exam  Constitutional: He is oriented to person, place, and time. He appears well-developed and well-nourished.  HENT:  Head: Normocephalic and atraumatic.  Mouth/Throat: Oropharynx is clear and moist.  Eyes: Pupils are equal, round, and reactive to light.  Neck: Normal range of motion. Neck supple.  Cardiovascular: Normal rate and regular rhythm.   No murmur heard. Pulmonary/Chest: Effort normal and breath sounds normal.  Abdominal: Soft. Bowel sounds are normal. There is no tenderness.  Neurological: He is alert and oriented to person, place, and time.  Skin: Skin is warm and dry.  Sebaceous cyst on left back  Psychiatric: He has a normal mood and affect.    Results for orders placed or performed in visit on 10/07/14  POCT glycosylated hemoglobin (Hb A1C)  Result Value Ref Range   Hemoglobin A1C 5.2          Assessment & Plan:     ICD-9-CM ICD-10-CM   1. Hyperlipemia 272.4 E78.5 Lipid panel     fenofibrate (TRICOR) 145 MG tablet  2. Other fatigue 780.79 R53.83 POCT CBC     Thyroid Panel With TSH     Vit D  25 hydroxy (rtn osteoporosis monitoring)  3. Essential hypertension, benign 401.1 I10 CMP14+EGFR     lisinopril-hydrochlorothiazide (PRINZIDE,ZESTORETIC) 20-25 MG per tablet  4. BPH (benign prostatic hypertrophy) 600.00 N40.0 PSA, total and free     tamsulosin (FLOMAX) 0.4 MG CAPS capsule  5. Hyperglycemia 790.29 R73.9 rosuvastatin (CRESTOR) 10 MG tablet     POCT glycosylated hemoglobin (Hb A1C)  6. Coronary artery disease involving coronary bypass graft of native heart without angina pectoris 414.05 I25.810 clopidogrel (PLAVIX) 75 MG tablet  7. Sebaceous cyst 706.2 L72.3 Ambulatory referral to Dermatology     Return in about 3 months (around 01/06/2015).  Lysbeth Penner FNP

## 2014-10-08 ENCOUNTER — Other Ambulatory Visit: Payer: Self-pay | Admitting: Family Medicine

## 2014-10-08 LAB — LIPID PANEL
Chol/HDL Ratio: 3.2 ratio units (ref 0.0–5.0)
Cholesterol, Total: 120 mg/dL (ref 100–199)
HDL: 37 mg/dL — ABNORMAL LOW (ref >39)
LDL Calculated: 65 mg/dL (ref 0–99)
Triglycerides: 88 mg/dL (ref 0–149)
VLDL Cholesterol Cal: 18 mg/dL (ref 5–40)

## 2014-10-08 LAB — CMP14+EGFR
ALT: 32 IU/L (ref 0–44)
AST: 23 IU/L (ref 0–40)
Albumin/Globulin Ratio: 1.8 (ref 1.1–2.5)
Albumin: 4.4 g/dL (ref 3.6–4.8)
Alkaline Phosphatase: 32 IU/L — ABNORMAL LOW (ref 39–117)
BUN/Creatinine Ratio: 18 (ref 10–22)
BUN: 21 mg/dL (ref 8–27)
CO2: 25 mmol/L (ref 18–29)
Calcium: 8.2 mg/dL — ABNORMAL LOW (ref 8.6–10.2)
Chloride: 104 mmol/L (ref 97–108)
Creatinine, Ser: 1.18 mg/dL (ref 0.76–1.27)
GFR calc Af Amer: 74 mL/min/{1.73_m2} (ref >59)
GFR calc non Af Amer: 64 mL/min/{1.73_m2} (ref >59)
Globulin, Total: 2.4 g/dL (ref 1.5–4.5)
Glucose: 86 mg/dL (ref 65–99)
Potassium: 4.7 mmol/L (ref 3.5–5.2)
Sodium: 144 mmol/L (ref 134–144)
Total Bilirubin: 0.7 mg/dL (ref 0.0–1.2)
Total Protein: 6.8 g/dL (ref 6.0–8.5)

## 2014-10-08 LAB — PSA, TOTAL AND FREE
PSA, Free Pct: 27.9 %
PSA, Free: 1.48 ng/mL
PSA: 5.3 ng/mL — ABNORMAL HIGH (ref 0.0–4.0)

## 2014-10-08 LAB — VITAMIN D 25 HYDROXY (VIT D DEFICIENCY, FRACTURES): Vit D, 25-Hydroxy: 14.2 ng/mL — ABNORMAL LOW (ref 30.0–100.0)

## 2014-10-08 LAB — THYROID PANEL WITH TSH
Free Thyroxine Index: 1.8 (ref 1.2–4.9)
T3 Uptake Ratio: 22 % — ABNORMAL LOW (ref 24–39)
T4, Total: 8.3 ug/dL (ref 4.5–12.0)
TSH: 0.506 u[IU]/mL (ref 0.450–4.500)

## 2014-10-08 MED ORDER — VITAMIN D (ERGOCALCIFEROL) 1.25 MG (50000 UNIT) PO CAPS: 50000.0000 [IU] | ORAL_CAPSULE | ORAL | Status: DC

## 2014-10-09 ENCOUNTER — Telehealth: Payer: Self-pay | Admitting: Family Medicine

## 2014-10-09 NOTE — Telephone Encounter (Signed)
Patient aware of PSA results and states that he is going to follow up with his urologist DR. Juliann Pulse. Patient states he will call today about an appointment.

## 2014-12-15 ENCOUNTER — Other Ambulatory Visit: Payer: Self-pay | Admitting: Family

## 2014-12-15 MED ORDER — AZITHROMYCIN 250 MG PO TABS: ORAL_TABLET | ORAL | Status: DC

## 2014-12-16 ENCOUNTER — Ambulatory Visit: Payer: 59 | Admitting: Family Medicine

## 2015-01-01 HISTORY — PX: PROSTATE BIOPSY: SHX241

## 2015-01-28 ENCOUNTER — Ambulatory Visit: Payer: 59 | Admitting: Radiation Oncology

## 2015-01-28 ENCOUNTER — Ambulatory Visit: Payer: 59

## 2015-02-01 DIAGNOSIS — C61 Malignant neoplasm of prostate: Secondary | ICD-10-CM | POA: Insufficient documentation

## 2015-02-01 NOTE — Progress Notes (Signed)
ICD-9-CM ICD-10-CM   1. Stage T1c adenocarcinoma of the prostate with a Gleason's Score of 3+4 and a PSA of 5.73 185 C61

## 2015-02-02 ENCOUNTER — Ambulatory Visit
Admission: RE | Admit: 2015-02-02 | Discharge: 2015-02-02 | Disposition: A | Discharge status: Home / Self Care | Payer: PRIVATE HEALTH INSURANCE | Source: Ambulatory Visit | Attending: Radiation Oncology | Admitting: Radiation Oncology

## 2015-02-02 ENCOUNTER — Encounter: Payer: Self-pay | Admitting: Radiation Oncology

## 2015-02-02 VITALS — BP 146/84 | HR 71 | Resp 16 | Ht 71.0 in | Wt 201.9 lb

## 2015-02-02 DIAGNOSIS — C61 Malignant neoplasm of prostate: Secondary | ICD-10-CM

## 2015-02-02 HISTORY — DX: Malignant neoplasm of prostate: C61

## 2015-02-02 HISTORY — DX: Allergy, unspecified, initial encounter: T78.40XA

## 2015-02-02 NOTE — Progress Notes (Signed)
See progress note under physician encounter. 

## 2015-02-02 NOTE — Progress Notes (Signed)
GI Location of Tumor / Histology: adenocarcinoma of prostate  Tommy [name] was seen in 2011 for an elevated PSA   Biopsies of prostate revealed:     Past/Anticipated interventions by medical oncology, if any: none  Weight changes, if any: Denies  Bowel/Bladder complaints, if any: Reports a weak stream only if he fails to take his Flomax. Denies urgency or incontinence. Reports hematuria following biopsy has resolved. Denies nocturia.  Nausea / Vomiting, if any: Denies  Pain issues, if any: Reports intermittent low back pain  SAFETY ISSUES:  Prior radiation? NO  Pacemaker/ICD? NO  Is the patient on methotrexate? NO  Current Complaints / other details: Married with 3 children. Former smoker. Candidate for EXRT but,  may need 6 months androgen ablation; prostate volume 31.55 cc. Full time Animal nutritionist.    Allergies:PCNS

## 2015-02-02 NOTE — Progress Notes (Signed)
Radiation Oncology         [phone]) [phone] ________________________________  Initial outpatient Consultation  Name: Tommy [name] MRN: [id number]  Date: 02/02/2015  DOB: August 27, 1951  BH:ALPFX, Tommy Rota, MD  Irine Seal, MD   REFERRING PHYSICIAN: Irine Seal, MD  DIAGNOSIS: 64 y.o. gentleman with stage T1c adenocarcinoma of the prostate with a Gleason's score of 3+4 and a PSA of 5.73.    ICD-9-CM ICD-10-CM   1. Stage T1c adenocarcinoma of the prostate with a Gleason's Score of 3+4 and a PSA of 5.73 Tommy [name] is a 64 y.o. gentleman.  He was noted to have an elevated PSA of 5.3 by his primary care physician, Dr. Laurance Flatten.  Accordingly, he was referred for evaluation in urology by Dr. Jeffie Pollock on 12/10/14,  digital rectal examination was performed at that time revealing 2+ gland with no nodules.  He repeated the PSA and it remained elevated at 5.73. The patient proceeded to transrectal ultrasound with 12 biopsies of the prostate on 01/01/15.  The prostate volume measured 31.5 cc.  Out of 12 core biopsies, 9 were positive.  The maximum Gleason score was 3+4, and this was seen in the distribution below:  .  The patient reviewed the biopsy results with his urologist and he has kindly been referred today for discussion of potential radiation treatment options.    PREVIOUS RADIATION THERAPY: No  PAST MEDICAL HISTORY:  has a past medical history of Peripheral vascular disease; Hypertension; Hyperlipidemia; Prostate cancer (01/01/15); Gastric ulcer; DVT (deep venous thrombosis); and Allergy.    PAST SURGICAL HISTORY: Past Surgical History  Procedure Laterality Date  . Femoral artery stent  07-2008 and  11-16-10    2nd stent  . Prostate biopsy  01/01/15  . Hand surgery    . Percutaneous femoral stent placement      transcath Intravascular    FAMILY HISTORY: family history includes Cancer in his father and mother; Deep vein thrombosis in his mother; Diabetes in his  mother; Heart attack in his mother; Heart disease in his mother; Hyperlipidemia in his mother; Hypertension in his mother; Peripheral vascular disease in his mother.  SOCIAL HISTORY:  reports that he has quit smoking. His smoking use included Cigarettes. He started smoking about 4 years ago. He has quit using smokeless tobacco. He reports that he does not drink alcohol or use illicit drugs.  ALLERGIES: Penicillins  MEDICATIONS:  Current Outpatient Prescriptions  Medication Sig Dispense Refill  . aspirin 81 MG tablet Take 81 mg by mouth daily.    . clopidogrel (PLAVIX) 75 MG tablet Take 1 tablet (75 mg total) by mouth daily. 30 tablet 5  . fenofibrate (TRICOR) 145 MG tablet Take 1 tablet (145 mg total) by mouth daily. 30 tablet 5  . lisinopril-hydrochlorothiazide (PRINZIDE,ZESTORETIC) 20-25 MG per tablet Take 1 tablet by mouth daily. 30 tablet 5  . rosuvastatin (CRESTOR) 10 MG tablet Take 1 tablet (10 mg total) by mouth daily. 30 tablet 5  . tamsulosin (FLOMAX) 0.4 MG CAPS capsule TAKE (1) CAPSULE DAILY 30 capsule 5  . Vitamin D, Ergocalciferol, (DRISDOL) 50000 UNITS CAPS capsule Take 1 capsule (50,000 Units total) by mouth every 7 (seven) days. 18 capsule 0   No current facility-administered medications for this encounter.    REVIEW OF SYSTEMS:  A 15 point review of systems is documented in the electronic medical record. This was obtained by the nursing staff. However, I reviewed this with the patient  to discuss relevant findings and make appropriate changes.  A comprehensive review of systems was negative..  The patient completed an IPSS and IIEF questionnaire.  His IPSS score was 6 indicating mild urinary outflow obstructive symptoms.  He indicated that his erectile function is able to complete sexual activity on fewer than half attempts.  Bowel/Bladder complaints, if any: Reports a weak stream only if he fails to take his Flomax. Denies urgency or incontinence. Reports hematuria following  biopsy has resolved. Denies nocturia.  Nausea / Vomiting, if any: Denies  Pain issues, if any: Reports intermittent low back pain   PHYSICAL EXAM: This patient is in no acute distress.  He is alert and oriented.   height is 5\' 11"  (1.803 m) and weight is 201 lb 14.4 oz (91.581 kg). His blood pressure is 146/84 and his pulse is 71. His respiration is 16 and oxygen saturation is 100%.  He exhibits no respiratory distress or labored breathing.  He appears neurologically intact.  His mood is pleasant.  His affect is appropriate.  Please note the digital rectal exam findings described above.  KPS = 100  100 - Normal; no complaints; no evidence of disease. 90   - Able to carry on normal activity; minor signs or symptoms of disease. 80   - Normal activity with effort; some signs or symptoms of disease. 66   - Cares for self; unable to carry on normal activity or to do active work. 60   - Requires occasional assistance, but is able to care for most of his personal needs. 50   - Requires considerable assistance and frequent medical care. 75   - Disabled; requires special care and assistance. 24   - Severely disabled; hospital admission is indicated although death not imminent. 67   - Very sick; hospital admission necessary; active supportive treatment necessary. 10   - Moribund; fatal processes progressing rapidly. 0     - Dead  Karnofsky DA, Abelmann Douglas, Craver LS and Burchenal [facility] [phone]) The use of the nitrogen mustards in the palliative treatment of carcinoma: with particular reference to bronchogenic carcinoma Cancer 1 634-56   LABORATORY DATA:  Lab Results  Component Value Date   WBC 4.6 10/07/2014   HGB 13.9* 10/07/2014   HCT 42.1* 10/07/2014   MCV 86.5 10/07/2014   Lab Results  Component Value Date   NA 144 10/07/2014   K 4.7 10/07/2014   CL 104 10/07/2014   CO2 25 10/07/2014   Lab Results  Component Value Date   ALT 32 10/07/2014   AST 23 10/07/2014   ALKPHOS 32* 10/07/2014    BILITOT 0.7 10/07/2014     RADIOGRAPHY: No results found.    IMPRESSION: This gentleman is a 64 y.o. gentleman with stage T1c adenocarcinoma of the prostate with a Gleason's score of 3+4 and a PSA of 5.73.Marland Kitchen  His T-Stage, Gleason's Score, and PSA put him into the intermediate risk group.  This patient falls into a select subgroup of intermediate rick patients who may be eligible for seed implant alone. Accordingly he is eligible for a variety of potential treatment options including external radiation or prostate seed implant.  PLAN: Today I reviewed the findings and workup thus far.  We discussed the natural history of prostate cancer.  We reviewed the the implications of T-stage, Gleason's Score, and PSA on decision-making and outcomes in prostate cancer.  We discussed radiation treatment in the management of prostate cancer with regard to the logistics and delivery of  external beam radiation treatment as well as the logistics and delivery of prostate brachytherapy.  We compared and contrasted each of these approaches and also compared these against prostatectomy.  The patient expressed interest in prostate brachytherapy.  I filled out a patient counseling form for him with relevant treatment diagrams and we retained a copy for our records.   The patient would like to proceed with prostate brachytherapy.  I will share my findings with Dr. Jeffie Pollock and move forward with scheduling the procedure in the near future.     I enjoyed meeting with him today, and will look forward to participating in the care of this very nice gentleman.  I spent 60 minutes face to face with the patient and more than 50% of that time was spent in counseling and/or coordination of care.   This document serves as a record of services personally performed by Tyler Pita, MD. It was created on his behalf by Darcus Austin, a trained medical scribe. The creation of this record is based on the scribe's personal observations and the  provider's statements to them. This document has been checked and approved by the attending provider.     ------------------------------------------------  Sheral Apley Tammi Klippel, M.D.

## 2015-02-04 ENCOUNTER — Institutional Professional Consult (permissible substitution): Payer: 59 | Admitting: Radiation Oncology

## 2015-02-23 ENCOUNTER — Other Ambulatory Visit: Payer: Self-pay | Admitting: Urology

## 2015-02-25 ENCOUNTER — Telehealth: Payer: Self-pay | Admitting: *Deleted

## 2015-02-25 NOTE — Telephone Encounter (Signed)
Called patient to remind of pre-seed appt. For 02-26-15, no answer will call later.

## 2015-02-26 ENCOUNTER — Ambulatory Visit
Admission: RE | Admit: 2015-02-26 | Discharge: 2015-02-26 | Disposition: A | Discharge status: Home / Self Care | Payer: BLUE CROSS/BLUE SHIELD | Source: Ambulatory Visit | Attending: Radiation Oncology | Admitting: Radiation Oncology

## 2015-02-26 ENCOUNTER — Ambulatory Visit
Admission: RE | Admit: 2015-02-26 | Payer: PRIVATE HEALTH INSURANCE | Source: Ambulatory Visit | Admitting: Radiation Oncology

## 2015-02-26 ENCOUNTER — Ambulatory Visit (HOSPITAL_COMMUNITY)
Admission: RE | Admit: 2015-02-26 | Discharge: 2015-02-26 | Disposition: A | Discharge status: Home / Self Care | Payer: BLUE CROSS/BLUE SHIELD | Source: Ambulatory Visit | Attending: Urology | Admitting: Urology

## 2015-02-26 DIAGNOSIS — C61 Malignant neoplasm of prostate: Secondary | ICD-10-CM

## 2015-02-26 NOTE — Progress Notes (Signed)
  Radiation Oncology         [phone] ________________________________  Name: Tommy [name]  MRN: [id number]  Date: 02/26/2015  DOB: [birth year]  SIMULATION AND TREATMENT PLANNING NOTE PUBIC ARCH STUDY  PY:PPJKD, Elenore Rota, MD  Irine Seal, MD  DIAGNOSIS: 64 y.o. gentleman with stage T1c adenocarcinoma of the prostate with a Gleason's score of 3+4 and a PSA of 5.73.    ICD-9-CM  ICD-10-CM    1.  Stage T1c adenocarcinoma of the prostate with a Gleason's Score of 3+4 and a PSA of 5.73  [place]:  The patient presented today for evaluation for possible prostate seed implant. He was brought to the radiation planning suite and placed supine on the CT couch. A 3-dimensional image study set was obtained in upload to the planning computer. There, on each axial slice, I contoured the prostate gland. Then, using three-dimensional radiation planning tools I reconstructed the prostate in view of the structures from the transperineal needle pathway to assess for possible pubic arch interference. In doing so, I did not appreciate any pubic arch interference. Also, the patient's prostate volume was estimated based on the drawn structure. The volume was 27 cc.  Given the pubic arch appearance and prostate volume, patient remains a good candidate to proceed with prostate seed implant. Today, he freely provided informed written consent to proceed.      PLAN: The patient will undergo prostate seed implant. Good candidate for seed implant. Scheduled for seed implant on 04/23/2015.  This document serves as a record of services personally performed by Tyler Pita, MD. It was created on his behalf by Arlyce Harman, a trained medical scribe. The creation of this record is based on the scribe's personal observations and the provider's statements to them. This document has been checked and approved by the attending provider.     ________________________________  Sheral Apley. Tammi Klippel,  M.D.

## 2015-04-10 HISTORY — PX: PROSTATE SURGERY: SHX751

## 2015-04-15 ENCOUNTER — Telehealth: Payer: Self-pay | Admitting: *Deleted

## 2015-04-15 NOTE — Telephone Encounter (Signed)
CALLED PATIENT TO REMIND OF LABS FOR IMPLANT ON 04/16/15, SPOKE WITH PATIENT AND HE IS AWARE OF THIS APPT.

## 2015-04-16 DIAGNOSIS — N401 Enlarged prostate with lower urinary tract symptoms: Secondary | ICD-10-CM | POA: Diagnosis not present

## 2015-04-16 DIAGNOSIS — Z86718 Personal history of other venous thrombosis and embolism: Secondary | ICD-10-CM | POA: Diagnosis not present

## 2015-04-16 DIAGNOSIS — J45909 Unspecified asthma, uncomplicated: Secondary | ICD-10-CM | POA: Diagnosis not present

## 2015-04-16 DIAGNOSIS — Z79899 Other long term (current) drug therapy: Secondary | ICD-10-CM | POA: Diagnosis not present

## 2015-04-16 DIAGNOSIS — Z87891 Personal history of nicotine dependence: Secondary | ICD-10-CM | POA: Diagnosis not present

## 2015-04-16 DIAGNOSIS — R3912 Poor urinary stream: Secondary | ICD-10-CM | POA: Diagnosis not present

## 2015-04-16 DIAGNOSIS — I739 Peripheral vascular disease, unspecified: Secondary | ICD-10-CM | POA: Diagnosis not present

## 2015-04-16 DIAGNOSIS — C61 Malignant neoplasm of prostate: Secondary | ICD-10-CM | POA: Diagnosis not present

## 2015-04-16 DIAGNOSIS — Z7902 Long term (current) use of antithrombotics/antiplatelets: Secondary | ICD-10-CM | POA: Diagnosis not present

## 2015-04-16 DIAGNOSIS — K219 Gastro-esophageal reflux disease without esophagitis: Secondary | ICD-10-CM | POA: Diagnosis not present

## 2015-04-16 DIAGNOSIS — I10 Essential (primary) hypertension: Secondary | ICD-10-CM | POA: Diagnosis not present

## 2015-04-16 DIAGNOSIS — N138 Other obstructive and reflux uropathy: Secondary | ICD-10-CM | POA: Diagnosis not present

## 2015-04-16 DIAGNOSIS — E785 Hyperlipidemia, unspecified: Secondary | ICD-10-CM | POA: Diagnosis not present

## 2015-04-16 DIAGNOSIS — N5201 Erectile dysfunction due to arterial insufficiency: Secondary | ICD-10-CM | POA: Diagnosis not present

## 2015-04-16 LAB — CBC
HCT: 38.3 % — ABNORMAL LOW (ref 39.0–52.0)
HEMOGLOBIN: 13.1 g/dL (ref 13.0–17.0)
MCH: 29.5 pg (ref 26.0–34.0)
MCHC: 34.2 g/dL (ref 30.0–36.0)
MCV: 86.3 fL (ref 78.0–100.0)
Platelets: 166 10*3/uL (ref 150–400)
RBC: 4.44 MIL/uL (ref 4.22–5.81)
RDW: 13 % (ref 11.5–15.5)
WBC: 3.9 10*3/uL — AB (ref 4.0–10.5)

## 2015-04-16 LAB — COMPREHENSIVE METABOLIC PANEL
ALT: 37 U/L (ref 17–63)
AST: 38 U/L (ref 15–41)
Albumin: 4.5 g/dL (ref 3.5–5.0)
Alkaline Phosphatase: 28 U/L — ABNORMAL LOW (ref 38–126)
Anion gap: 5 (ref 5–15)
BUN: 25 mg/dL — AB (ref 6–20)
CALCIUM: 10.5 mg/dL — AB (ref 8.9–10.3)
CO2: 31 mmol/L (ref 22–32)
CREATININE: 1.39 mg/dL — AB (ref 0.61–1.24)
Chloride: 103 mmol/L (ref 101–111)
GFR, EST AFRICAN AMERICAN: 59 mL/min — AB (ref >60)
GFR, EST NON AFRICAN AMERICAN: 51 mL/min — AB (ref >60)
GLUCOSE: 116 mg/dL — AB (ref 65–99)
Potassium: 4.6 mmol/L (ref 3.5–5.1)
SODIUM: 139 mmol/L (ref 135–145)
TOTAL PROTEIN: 7.1 g/dL (ref 6.5–8.1)
Total Bilirubin: 0.8 mg/dL (ref 0.3–1.2)

## 2015-04-16 LAB — PROTIME-INR
INR: 1.12 (ref 0.00–1.49)
Prothrombin Time: 14.6 seconds (ref 11.6–15.2)

## 2015-04-16 LAB — APTT: aPTT: 27 seconds (ref 24–37)

## 2015-04-21 ENCOUNTER — Encounter (HOSPITAL_BASED_OUTPATIENT_CLINIC_OR_DEPARTMENT_OTHER): Payer: Self-pay | Admitting: *Deleted

## 2015-04-21 ENCOUNTER — Other Ambulatory Visit: Payer: Self-pay | Admitting: Family Medicine

## 2015-04-22 ENCOUNTER — Telehealth: Payer: Self-pay | Admitting: *Deleted

## 2015-04-22 ENCOUNTER — Encounter (HOSPITAL_BASED_OUTPATIENT_CLINIC_OR_DEPARTMENT_OTHER): Payer: Self-pay | Admitting: *Deleted

## 2015-04-22 NOTE — Telephone Encounter (Signed)
Last seen 10/07/14 B [place]   No upcoming appt scheduled

## 2015-04-22 NOTE — H&P (Signed)
Active Problems Problems  1. Benign prostatic hyperplasia with urinary obstruction (N40.1,N13.8) 2. Elevated prostate specific antigen (PSA) (R97.2) 3. Erectile dysfunction due to arterial insufficiency (N52.01) 4. Prostate cancer (C61) 5. Weak urinary stream (R39.12)  History of Present Illness Tommy [name] returns today in f/u to discuss his prostate biopsy which showed a T2c Nx Mx Gleason 7(3+4) prostate cancer. His prostate volume is 84ml.  He had 9 cores positive with 5 on the right and 3 on the left with Gleason 6 from 5-80% with the higher volumes at the right apical and mid lateral cores. There was a single core of Gleason 7(3+4) 25% at the left lateral base. His PSA was 5.3 and he had bilateral induration on exam. He has BOO with LUTS but is voiding well on tamsulosin with an IPSS of 8. His SHIM is low at 4. His CAPRA score was 3 with a 40+% chance of positive margins, 20+ % chance of ECE, 9% chance of SVI and 1.4% chance of LNI.   Past Medical History Problems  1. History of asthma (Z87.09) 2. History of esophageal reflux (Z87.19) 3. History of gastric ulcer (Z87.19) 4. History of hematuria (Z87.448) 5. History of hyperlipidemia (Z86.39) 6. History of hypertension (Z86.79) 7. History of Nodular prostate without lower urinary tract symptoms (N40.2) 8. History of Urge incontinence of urine (N39.41) 9. History of Venous Thrombosis Of The Deep Vessels Of The Lower Extremity  Surgical History Problems  1. History of Hand Surgery 2. History of Hand Surgery 3. History of Transcath Intravascular Stent Placement Percutaneous Femoral  Current Meds 1. Crestor TABS;  Therapy: (Recorded:08Jun2010) to Recorded 2. Flomax 0.4 MG CP24; TAKE 1 CAPSULE Daily;  Therapy: [id number] to (Evaluate:02Dec2009); Last Rx:02Nov2009 Ordered 3. Levofloxacin 500 MG Oral Tablet; Take one tablet daily starting day before procedure;  Therapy: 96GEZ6629 to (Evaluate:05Mar2016)  Requested for: 47MLY6503; Last  Rx:02Mar2016 Ordered 4. Lisinopril TABS;  Therapy: (Recorded:08Jun2010) to Recorded 5. Plavix TABS;  Therapy: (Recorded:08Jun2010) to Recorded 6. Tricor TABS;  Therapy: (Recorded:07Jul2008) to Recorded  Allergies Medication  1. Penicillins  Family History Problems  1. Family history of Acute Myocardial Infarction : Mother 2. Family history of Death In The Family Father   Jan 21, 2041. Family history of Death In The Family Mother   42 4. Family history of Diabetes Mellitus : Mother 5. Family history of Family Health Status Number Of Children   1-son 2-daughters 6. Family history of kidney cancer (Z80.51) 7. Family history of Hypertension : Mother 43. Family history of Leukemia : Father 20. Family history of Stroke Syndrome : Mother  Social History Problems  1. Denied: Alcohol Use 2. Caffeine Use   2 per day 3. Marital History - Currently Married 4. No caffeine use 5. Occupation:   Works in a copper plant 6. Tobacco Use   1 ppd for 15 years but quit in 22-Jan-2011.  Results/Data  The following clinical lab reports were reviewed:  Path report reviewed.  The following medical tests were reviewed: [place] Score calculated.  IPSS: The IPSS today is 8  QOL score is 4  SHIM: The SHIM score today is 4 .    Assessment Assessed  1. Prostate cancer (C61) 2. Benign prostatic hyperplasia with urinary obstruction (N40.1,N13.8) 3. Weak urinary stream (R39.12) 4. Erectile dysfunction due to arterial insufficiency (N52.01)  He has T2c Nx Mx Gleason 7(3+4) in one core and Gleason 6 in eight cores with a PSA of 5.3 and prostate volume of 10ml.   He has BOO  with LUTS but mild/mod symptoms on tamsulosin.  He has preexisting ED.   Plan Prostate cancer  1. Radiation Oncology Referral Referral  Referral  Status: Hold For - Appointment,Records   Requested for: 14Apr2016  I have given him my handouts on prostate cancer therapy and post treatment ED therapy along with his path report to  review. The details of our discussion are outlined in the handout and won't be recapitulated.     He is not the best candidate for Active surveillance with the extent of his disease and highest Gleason score.    I reviewed HIFU but didn't recommend it.    I reviewed Cryotherapy along with the risks and benefits as he is a candidate for that based on his prostate volume and preexisting ED but I am concerned about the results with intermediate risk disease.    He is a candidate for RALP and I reviewed the risks and benefits.  He has some LUTS which the surgery should help. He has severe ED and is unlikely to recover function post op.  He has some comorbidities that make him somewhat higher risks for surgery.    He is a candidate for EXRT but might need 6 months of androgen ablation.  I reviewed the risks in detail along with the treatment course.     He is a possible candidate for brachytherapy, but with the extent of his disease, I would like Dr. Johny Shears opinion on whether he is a candidate for monotherapy vs combination therapy.  I reviewed the risks and benefits of brachytherapy.  He has somewhat increased risk of voiding symptoms post op but his IPSS is only 8 with a prostate of 12ml so the risk are not excessive.     After a review of the options, he is most interested in considering Brachytherapy.  I will set him up to see Dr. Tammi Klippel in consultation.     Face to face time with the patient was about 45 min with >50% face to face discussing his prostate cancer and treatment options.

## 2015-04-22 NOTE — Progress Notes (Signed)
NPO AFTER MN.  ARRIVE AT 0800.  CURRENT LAB RESULTS , CXR AND EKG IN CHART AND EPIC.  WILL DO FLEET ENEMA AM DOS.

## 2015-04-22 NOTE — Telephone Encounter (Signed)
CALLED PATIENT TO REMIND OF PROCEDURE FOR 04-23-15, LVM FOR A RETURN CALL

## 2015-04-22 NOTE — Telephone Encounter (Signed)
Notified NTBS before further refills

## 2015-04-22 NOTE — Telephone Encounter (Signed)
no more refills without being seen  

## 2015-04-23 ENCOUNTER — Encounter (HOSPITAL_BASED_OUTPATIENT_CLINIC_OR_DEPARTMENT_OTHER)
Admission: RE | Disposition: A | Discharge status: Home / Self Care | Payer: Self-pay | Source: Ambulatory Visit | Attending: Urology

## 2015-04-23 ENCOUNTER — Ambulatory Visit (HOSPITAL_BASED_OUTPATIENT_CLINIC_OR_DEPARTMENT_OTHER)
Admission: RE | Admit: 2015-04-23 | Discharge: 2015-04-23 | Disposition: A | Discharge status: Home / Self Care | Payer: BLUE CROSS/BLUE SHIELD | Source: Ambulatory Visit | Attending: Urology | Admitting: Urology

## 2015-04-23 ENCOUNTER — Encounter (HOSPITAL_BASED_OUTPATIENT_CLINIC_OR_DEPARTMENT_OTHER): Payer: Self-pay | Admitting: *Deleted

## 2015-04-23 ENCOUNTER — Ambulatory Visit (HOSPITAL_BASED_OUTPATIENT_CLINIC_OR_DEPARTMENT_OTHER): Payer: BLUE CROSS/BLUE SHIELD | Admitting: Anesthesiology

## 2015-04-23 DIAGNOSIS — Z86718 Personal history of other venous thrombosis and embolism: Secondary | ICD-10-CM | POA: Insufficient documentation

## 2015-04-23 DIAGNOSIS — J45909 Unspecified asthma, uncomplicated: Secondary | ICD-10-CM | POA: Insufficient documentation

## 2015-04-23 DIAGNOSIS — N401 Enlarged prostate with lower urinary tract symptoms: Secondary | ICD-10-CM | POA: Insufficient documentation

## 2015-04-23 DIAGNOSIS — K219 Gastro-esophageal reflux disease without esophagitis: Secondary | ICD-10-CM | POA: Insufficient documentation

## 2015-04-23 DIAGNOSIS — N138 Other obstructive and reflux uropathy: Secondary | ICD-10-CM | POA: Insufficient documentation

## 2015-04-23 DIAGNOSIS — E785 Hyperlipidemia, unspecified: Secondary | ICD-10-CM | POA: Insufficient documentation

## 2015-04-23 DIAGNOSIS — C61 Malignant neoplasm of prostate: Secondary | ICD-10-CM | POA: Insufficient documentation

## 2015-04-23 DIAGNOSIS — Z7902 Long term (current) use of antithrombotics/antiplatelets: Secondary | ICD-10-CM | POA: Insufficient documentation

## 2015-04-23 DIAGNOSIS — I739 Peripheral vascular disease, unspecified: Secondary | ICD-10-CM | POA: Insufficient documentation

## 2015-04-23 DIAGNOSIS — R3912 Poor urinary stream: Secondary | ICD-10-CM | POA: Insufficient documentation

## 2015-04-23 DIAGNOSIS — I10 Essential (primary) hypertension: Secondary | ICD-10-CM | POA: Insufficient documentation

## 2015-04-23 DIAGNOSIS — Z79899 Other long term (current) drug therapy: Secondary | ICD-10-CM | POA: Insufficient documentation

## 2015-04-23 DIAGNOSIS — N5201 Erectile dysfunction due to arterial insufficiency: Secondary | ICD-10-CM | POA: Insufficient documentation

## 2015-04-23 DIAGNOSIS — Z87891 Personal history of nicotine dependence: Secondary | ICD-10-CM | POA: Insufficient documentation

## 2015-04-23 HISTORY — DX: Other obstructive and reflux uropathy: N13.8

## 2015-04-23 HISTORY — DX: Other obstructive and reflux uropathy: N40.1

## 2015-04-23 HISTORY — PX: RADIOACTIVE SEED IMPLANT: SHX5150

## 2015-04-23 HISTORY — DX: Personal history of other venous thrombosis and embolism: Z86.718

## 2015-04-23 HISTORY — DX: Personal history of other diseases of the digestive system: Z87.19

## 2015-04-23 HISTORY — DX: Presence of spectacles and contact lenses: Z97.3

## 2015-04-23 HISTORY — DX: Male erectile dysfunction, unspecified: N52.9

## 2015-04-23 HISTORY — DX: Personal history of peptic ulcer disease: Z87.11

## 2015-04-23 HISTORY — DX: Benign prostatic hyperplasia with lower urinary tract symptoms: N40.1

## 2015-04-23 SURGERY — INSERTION, RADIATION SOURCE, PROSTATE
Anesthesia: General | Site: Prostate

## 2015-04-23 MED ORDER — CIPROFLOXACIN HCL 500 MG PO TABS: 500.0000 mg | ORAL_TABLET | Freq: Two times a day (BID) | ORAL | Status: DC

## 2015-04-23 MED ORDER — CIPROFLOXACIN IN D5W 400 MG/200ML IV SOLN
400.0000 mg | INTRAVENOUS | Status: AC
  Administered 2015-04-23: 400 mg via INTRAVENOUS
  Filled 2015-04-23: qty 200

## 2015-04-23 MED ORDER — BELLADONNA ALKALOIDS-OPIUM 16.2-60 MG RE SUPP
RECTAL | Status: DC | PRN
  Administered 2015-04-23: 1 via RECTAL

## 2015-04-23 MED ORDER — LIDOCAINE HCL (CARDIAC) 20 MG/ML IV SOLN
INTRAVENOUS | Status: DC | PRN
  Administered 2015-04-23: 100 mg via INTRAVENOUS

## 2015-04-23 MED ORDER — DOCUSATE SODIUM 100 MG PO CAPS
100.0000 mg | ORAL_CAPSULE | Freq: Two times a day (BID) | ORAL | Status: DC
Start: 2015-04-23 — End: 2015-05-18

## 2015-04-23 MED ORDER — IOHEXOL 350 MG/ML SOLN
INTRAVENOUS | Status: DC | PRN
  Administered 2015-04-23: 7 mL

## 2015-04-23 MED ORDER — MIDAZOLAM HCL 2 MG/2ML IJ SOLN
INTRAMUSCULAR | Status: AC
  Filled 2015-04-23: qty 2

## 2015-04-23 MED ORDER — CIPROFLOXACIN IN D5W 400 MG/200ML IV SOLN
INTRAVENOUS | Status: AC
  Filled 2015-04-23: qty 200

## 2015-04-23 MED ORDER — SODIUM CHLORIDE 0.9 % IJ SOLN
3.0000 mL | Freq: Two times a day (BID) | INTRAMUSCULAR | Status: DC
  Filled 2015-04-23: qty 3

## 2015-04-23 MED ORDER — LACTATED RINGERS IV SOLN
INTRAVENOUS | Status: DC
  Administered 2015-04-23 (×3): via INTRAVENOUS
  Filled 2015-04-23: qty 1000

## 2015-04-23 MED ORDER — FENTANYL CITRATE (PF) 100 MCG/2ML IJ SOLN
INTRAMUSCULAR | Status: AC
  Filled 2015-04-23: qty 4

## 2015-04-23 MED ORDER — DEXAMETHASONE SODIUM PHOSPHATE 10 MG/ML IJ SOLN
INTRAMUSCULAR | Status: DC | PRN
  Administered 2015-04-23: 10 mg via INTRAVENOUS

## 2015-04-23 MED ORDER — ACETAMINOPHEN 10 MG/ML IV SOLN
INTRAVENOUS | Status: DC | PRN
  Administered 2015-04-23: 1000 mg via INTRAVENOUS

## 2015-04-23 MED ORDER — HYDROCODONE-ACETAMINOPHEN 5-325 MG PO TABS: 1.0000 | ORAL_TABLET | Freq: Four times a day (QID) | ORAL | Status: DC | PRN

## 2015-04-23 MED ORDER — SODIUM CHLORIDE 0.9 % IV SOLN
250.0000 mL | INTRAVENOUS | Status: DC | PRN
  Filled 2015-04-23: qty 250

## 2015-04-23 MED ORDER — ACETAMINOPHEN 650 MG RE SUPP
650.0000 mg | RECTAL | Status: DC | PRN
  Filled 2015-04-23: qty 1

## 2015-04-23 MED ORDER — LACTATED RINGERS IV SOLN
INTRAVENOUS | Status: DC
  Filled 2015-04-23: qty 1000

## 2015-04-23 MED ORDER — OXYCODONE HCL 5 MG PO TABS
5.0000 mg | ORAL_TABLET | ORAL | Status: DC | PRN
  Filled 2015-04-23: qty 2

## 2015-04-23 MED ORDER — ACETAMINOPHEN 325 MG PO TABS
650.0000 mg | ORAL_TABLET | ORAL | Status: DC | PRN
  Filled 2015-04-23: qty 2

## 2015-04-23 MED ORDER — STERILE WATER FOR IRRIGATION IR SOLN
Status: DC | PRN
  Administered 2015-04-23: 500 mL
  Administered 2015-04-23: 3000 mL

## 2015-04-23 MED ORDER — KETOROLAC TROMETHAMINE 30 MG/ML IJ SOLN
INTRAMUSCULAR | Status: DC | PRN
  Administered 2015-04-23: 30 mg via INTRAVENOUS

## 2015-04-23 MED ORDER — FENTANYL CITRATE (PF) 100 MCG/2ML IJ SOLN
25.0000 ug | INTRAMUSCULAR | Status: DC | PRN
  Filled 2015-04-23: qty 1

## 2015-04-23 MED ORDER — MIDAZOLAM HCL 5 MG/5ML IJ SOLN
INTRAMUSCULAR | Status: DC | PRN
  Administered 2015-04-23 (×2): 1 mg via INTRAVENOUS

## 2015-04-23 MED ORDER — SODIUM CHLORIDE 0.9 % IJ SOLN
3.0000 mL | INTRAMUSCULAR | Status: DC | PRN
  Filled 2015-04-23: qty 3

## 2015-04-23 MED ORDER — FLEET ENEMA 7-19 GM/118ML RE ENEM
1.0000 | ENEMA | Freq: Once | RECTAL | Status: AC
  Administered 2015-04-23: 1 via RECTAL
  Filled 2015-04-23: qty 1

## 2015-04-23 MED ORDER — PROPOFOL 10 MG/ML IV BOLUS
INTRAVENOUS | Status: DC | PRN
  Administered 2015-04-23: 200 mg via INTRAVENOUS
  Administered 2015-04-23: 30 mg via INTRAVENOUS

## 2015-04-23 MED ORDER — ONDANSETRON HCL 4 MG/2ML IJ SOLN
INTRAMUSCULAR | Status: DC | PRN
  Administered 2015-04-23: 4 mg via INTRAVENOUS

## 2015-04-23 MED ORDER — FENTANYL CITRATE (PF) 100 MCG/2ML IJ SOLN
INTRAMUSCULAR | Status: DC | PRN
  Administered 2015-04-23: 50 ug via INTRAVENOUS
  Administered 2015-04-23 (×6): 25 ug via INTRAVENOUS

## 2015-04-23 MED ORDER — BELLADONNA ALKALOIDS-OPIUM 16.2-60 MG RE SUPP
RECTAL | Status: AC
  Filled 2015-04-23: qty 1

## 2015-04-23 SURGICAL SUPPLY — 24 items
BAG URINE DRAINAGE (UROLOGICAL SUPPLIES) ×2 IMPLANT
BLADE CLIPPER SURG (BLADE) ×2 IMPLANT
CATH FOLEY 2WAY SLVR  5CC 16FR (CATHETERS) ×1
CATH FOLEY 2WAY SLVR 5CC 16FR (CATHETERS) ×1 IMPLANT
CATH ROBINSON RED A/P 20FR (CATHETERS) ×2 IMPLANT
CLOTH BEACON ORANGE TIMEOUT ST (SAFETY) ×2 IMPLANT
COVER BACK TABLE 60X90IN (DRAPES) ×2 IMPLANT
COVER MAYO STAND STRL (DRAPES) ×2 IMPLANT
DRSG TEGADERM 4X4.75 (GAUZE/BANDAGES/DRESSINGS) ×2 IMPLANT
DRSG TEGADERM 8X12 (GAUZE/BANDAGES/DRESSINGS) ×3 IMPLANT
GLOVE BIO SURGEON STRL SZ7.5 (GLOVE) ×4 IMPLANT
GLOVE ECLIPSE 8.0 STRL XLNG CF (GLOVE) ×4 IMPLANT
GLOVE SURG SS PI 8.0 STRL IVOR (GLOVE) ×4 IMPLANT
GOWN STRL REUS W/ TWL XL LVL3 (GOWN DISPOSABLE) ×1 IMPLANT
GOWN STRL REUS W/TWL XL LVL3 (GOWN DISPOSABLE) ×3 IMPLANT
HOLDER FOLEY CATH W/STRAP (MISCELLANEOUS) ×1 IMPLANT
MANIFOLD NEPTUNE II (INSTRUMENTS) IMPLANT
PACK CYSTO (CUSTOM PROCEDURE TRAY) ×2 IMPLANT
SPONGE GAUZE 4X4 12PLY STER LF (GAUZE/BANDAGES/DRESSINGS) ×1 IMPLANT
SYRINGE 10CC LL (SYRINGE) ×3 IMPLANT
UNDERPAD 30X30 INCONTINENT (UNDERPADS AND DIAPERS) ×4 IMPLANT
WATER STERILE IRR 3000ML UROMA (IV SOLUTION) ×2 IMPLANT
WATER STERILE IRR 500ML POUR (IV SOLUTION) ×2 IMPLANT
radioactive seed ×89 IMPLANT

## 2015-04-23 NOTE — Op Note (Signed)
PATIENT:  Tommy [name]  PRE-OPERATIVE DIAGNOSIS:  Adenocarcinoma of the prostate  POST-OPERATIVE DIAGNOSIS:  Same  PROCEDURE:  Procedure(s): 1. I-125 radioactive seed implantation 2. Cystoscopy  SURGEON:  Surgeon(s): Irine Seal MD  Radiation oncologist: Dr. Tyler Pita  ANESTHESIA:  General  EBL:  Minimal  DRAINS: 71 French Foley catheter  INDICATION: Tommy [name] is a 64 y.o. with Stage T2c Nx Mx, Gleason 7(3+4) prostate cancer who has elected brachytherapy for treatment.  Description of procedure: After informed consent the patient was brought to the major OR, placed on the table and administered general anesthesia. He was then moved to the modified lithotomy position with his perineum perpendicular to the floor. His perineum and genitalia were then sterilely prepped. An official timeout was then performed. A 16 French Foley catheter was then placed in the bladder and filled with dilute contrast, a rectal tube was placed in the rectum and the transrectal ultrasound probe was placed in the rectum and affixed to the stand. He was then sterilely draped.  The sterile grid was installed.   Anchor needles were then placed.   Real time ultrasonography was used along with the seed planning software spot-pro version 3.1-00. This was used to develop the seed plan including the number of needles as well as number of seeds required for complete and adequate coverage. Real-time ultrasonography was then used along with the previously developed plan and the Nucletron device to implant a total of 89 seeds using 25 needles for a target dose of 145 Gy. This proceeded without difficulty or complication.  A Foley catheter was then removed as well as the transrectal ultrasound probe and rectal probe. Flexible cystoscopy was then performed using the 17 French flexible scope which revealed a normal urethra throughout its length down to the sphincter which appeared intact. The prostatic urethra was 3cm with  bilobar hyperplasia with minimal obstruction. The bladder was then entered and fully and systematically.  The ureteral orifices were noted to be of normal configuration and position. The mucosa revealed no evidence of tumors. There were also no stones identified within the bladder.  There were several small loose ovoid structures that looked like possible keratin.   There was a small clot in the bladder.  No seeds or spacers were seen and/or removed from the bladder.  The cystoscope was then removed.  The drapes were removed.  The perineum was cleaned and dressed.  He was taken out of the lithotomy position and was awakened and taken to recovery room in stable and satisfactory condition. He tolerated procedure well and there were no intraoperative complications.

## 2015-04-23 NOTE — Discharge Instructions (Addendum)
Brachytherapy for Prostate Cancer, Care After Refer to this sheet in the next few weeks. These instructions provide you with information on caring for yourself after your procedure. Your health care provider may also give you more specific instructions. Your treatment has been planned according to current medical practices, but problems sometimes occur. Call your health care provider if you have any problems or questions after your procedure. WHAT TO EXPECT AFTER THE PROCEDURE The area behind the scrotum will probably be tender and bruised. For a short period of time you may have:  Difficulty passing urine. You may need a catheter for a few days to a month.  Blood in the urine or semen.  A feeling of constipation because of prostate swelling.  Frequent feeling of an urgent need to urinate. For a long period of time you may have:  Inflammation of the rectum. This happens in about 2% of people who have the procedure.  Erection problems. These vary with age and occur in about 15-40% of men.  Difficulty urinating. This is caused by scarring in the urethra.  Diarrhea. HOME CARE INSTRUCTIONS   Take medicines only as directed by your health care provider.  You will probably have a catheter in your bladder for several days. You will have blood in the urine bag and should drink a lot of fluids to keep it a light red color.  Keep all follow-up visits as directed by your health care provider. If you have a catheter, it will be removed during one of these visits.  Try not to sit directly on the area behind the scrotum. A soft cushion can decrease the discomfort. Ice packs may also be helpful for the discomfort. Do not put ice directly on the skin.  Shower and wash the area behind the scrotum gently. Do not sit in a tub.  If you have had the brachytherapy that uses the seeds, limit your close contact with children and pregnant women for 2 months because of the radiation still in the prostate.  After that period of time, the levels drop off quickly. SEEK IMMEDIATE MEDICAL CARE IF:   You have a fever.  You have chills.  You have shortness of breath.  You have chest pain.  You have thick blood, like tomato juice, in the urine bag.  Your catheter is blocked so urine cannot get into the bag. Your bladder area or lower abdomen may be swollen.  There is excessive bleeding from your rectum. It is normal to have a little blood mixed with your stool.  There is severe discomfort in the treated area that does not go away with pain medicine.  You have abdominal discomfort.  You have severe nausea or vomiting.  You develop any new or unusual symptoms. Document Released: 10/29/2010 Document Revised: 02/10/2014 Document Reviewed: 03/19/2013 [facility] Patient Information 2015 [place], [place]. This information is not intended to replace advice given to you by your health care provider. Make sure you discuss any questions you have with your health care provider.     Post Anesthesia Home Care Instructions  Activity: Get plenty of rest for the remainder of the day. A responsible adult should stay with you for 24 hours following the procedure.  For the next 24 hours, DO NOT: -Drive a car -Paediatric nurse -Drink alcoholic beverages -Take any medication unless instructed by your physician -Make any legal decisions or sign important papers.  Meals: Start with liquid foods such as gelatin or soup. Progress to regular foods as tolerated.  Avoid greasy, spicy, heavy foods. If nausea and/or vomiting occur, drink only clear liquids until the nausea and/or vomiting subsides. Call your physician if vomiting continues.  Special Instructions/Symptoms: Your throat may feel dry or sore from the anesthesia or the breathing tube placed in your throat during surgery. If this causes discomfort, gargle with warm salt water. The discomfort should disappear within 24 hours.  If you had a scopolamine  patch placed behind your ear for the management of post- operative nausea and/or vomiting:  1. The medication in the patch is effective for 72 hours, after which it should be removed.  Wrap patch in a tissue and discard in the trash. Wash hands thoroughly with soap and water. 2. You may remove the patch earlier than 72 hours if you experience unpleasant side effects which may include dry mouth, dizziness or visual disturbances. 3. Avoid touching the patch. Wash your hands with soap and water after contact with the patch.

## 2015-04-23 NOTE — Progress Notes (Signed)
  Radiation Oncology         [phone] ________________________________  Name: Tommy [name] MRN: [id number]  Date: 04/23/2015  DOB: Sep 13, 1951       Prostate Seed Implant  PJ:KDTOI, Elenore Rota, MD  No ref. provider found  DIAGNOSIS: 64 y.o. gentleman with stage T1c adenocarcinoma of the prostate with a Gleason's score of 3+4 and a PSA of 5.73.    ICD-9-CM ICD-10-CM   1. Prostate cancer 185 C61 DG Chest 2 View     DG Chest 2 View   PROCEDURE: Insertion of radioactive I-125 seeds into the prostate gland.  RADIATION DOSE: 145 Gy, definitive therapy.  TECHNIQUE: Tommy [name] was brought to the operating room with the urologist. He was placed in the dorsolithotomy position. He was catheterized and a rectal tube was inserted. The perineum was shaved, prepped and draped. The ultrasound probe was then introduced into the rectum to see the prostate gland.  TREATMENT DEVICE: A needle grid was attached to the ultrasound probe stand and anchor needles were placed.  3D PLANNING: The prostate was imaged in 3D using a sagittal sweep of the prostate probe. These images were transferred to the planning computer. There, the prostate, urethra and rectum were defined on each axial reconstructed image. Then, the software created an optimized 3D plan and a few seed positions were adjusted. The quality of the plan was reviewed using [facility] information for the target and the following two organs at risk:  Urethra and Rectum.  Then the accepted plan was uploaded to the seed Selectron afterloading unit.  PROSTATE VOLUME STUDY:  Using transrectal ultrasound the volume of the prostate was verified to be 43.9 cc.  SPECIAL TREATMENT PROCEDURE/SUPERVISION AND HANDLING: The Nucletron FIRST system was used to place the needles under sagittal guidance. A total of 25 needles were used to deposit 89 seeds in the prostate gland. The individual seed activity was 0.363 mCi.  COMPLEX SIMULATION: At the end of the procedure, an  anterior radiograph of the pelvis was obtained to document seed positioning and count. Cystoscopy was performed to check the urethra and bladder.  MICRODOSIMETRY: At the end of the procedure, the patient was emitting 0.11 mrem/hr at 1 meter. Accordingly, he was considered safe for hospital discharge.  PLAN: The patient will return to the radiation oncology clinic for post implant CT dosimetry in three weeks.   ________________________________  Sheral Apley Tammi Klippel, M.D.

## 2015-04-23 NOTE — Transfer of Care (Signed)
Immediate Anesthesia Transfer of Care Note  Patient: Tommy [name]  Procedure(s) Performed: Procedure(s) (LRB): RADIOACTIVE SEED IMPLANT/BRACHYTHERAPY IMPLANT (N/A)  Patient Location: PACU  Anesthesia Type: General  Level of Consciousness: awake, sedated, patient un - cooperative and responds to stimulation, combative and trying to get out of bed - assistance x 5 to help   Airway & Oxygen Therapy: Patient Spontanous Breathing and Patient connected to face mask oxygen  Post-op Assessment: Report given to PACU RN, Post -op Vital signs reviewed and stable and Patient moving all extremities  Post vital signs: Reviewed and stable  Complications: No apparent anesthesia complications

## 2015-04-23 NOTE — Anesthesia Procedure Notes (Signed)
Procedure Name: LMA Insertion Date/Time: 04/23/2015 9:49 AM Performed by: Justice Rocher Pre-anesthesia Checklist: Patient identified, Emergency Drugs available, Suction available and Patient being monitored Patient Re-evaluated:Patient Re-evaluated prior to inductionOxygen Delivery Method: Circle System Utilized Preoxygenation: Pre-oxygenation with 100% oxygen Intubation Type: IV induction Ventilation: Mask ventilation without difficulty LMA: LMA inserted LMA Size: 5.0 Number of attempts: 1 Airway Equipment and Method: bite block Placement Confirmation: positive ETCO2 Tube secured with: Tape Dental Injury: Teeth and Oropharynx as per pre-operative assessment

## 2015-04-23 NOTE — Anesthesia Postprocedure Evaluation (Signed)
  Anesthesia Post-op Note  Patient: Tommy [name]  Procedure(s) Performed: Procedure(s) (LRB): RADIOACTIVE SEED IMPLANT/BRACHYTHERAPY IMPLANT (N/A)  Patient Location: PACU  Anesthesia Type: General  Level of Consciousness: awake and alert   Airway and Oxygen Therapy: Patient Spontanous Breathing  Post-op Pain: mild  Post-op Assessment: Post-op Vital signs reviewed, Patient's Cardiovascular Status Stable, Respiratory Function Stable, Patent Airway and No signs of Nausea or vomiting  Last Vitals:  Filed Vitals:   04/23/15 1215  BP: 109/65  Pulse: 81  Temp:   Resp: 8    Post-op Vital Signs: stable   Complications: No apparent anesthesia complications

## 2015-04-23 NOTE — Anesthesia Preprocedure Evaluation (Addendum)
Anesthesia Evaluation  Patient identified by MRN, date of birth, ID band Patient awake    Reviewed: Allergy & Precautions, H&P , NPO status , Patient's Chart, lab work & pertinent test results  Airway Mallampati: II  TM Distance: >3 FB Neck ROM: full    Dental no notable dental hx. (+) Dental Advisory Given, Teeth Intact   Pulmonary neg pulmonary ROS, former smoker,  breath sounds clear to auscultation  Pulmonary exam normal       Cardiovascular Exercise Tolerance: Good hypertension, Pt. on medications + Peripheral Vascular Disease Normal cardiovascular examRhythm:regular Rate:Normal     Neuro/Psych negative neurological ROS  negative psych ROS   GI/Hepatic negative GI ROS, Neg liver ROS,   Endo/Other  negative endocrine ROS  Renal/GU negative Renal ROS  negative genitourinary   Musculoskeletal   Abdominal   Peds  Hematology negative hematology ROS (+)   Anesthesia Other Findings   Reproductive/Obstetrics negative OB ROS                            Anesthesia Physical Anesthesia Plan  ASA: III  Anesthesia Plan: General   Post-op Pain Management:    Induction: Intravenous  Airway Management Planned: LMA  Additional Equipment:   Intra-op Plan:   Post-operative Plan:   Informed Consent: I have reviewed the patients History and Physical, chart, labs and discussed the procedure including the risks, benefits and alternatives for the proposed anesthesia with the patient or authorized representative who has indicated his/her understanding and acceptance.   Dental Advisory Given  Plan Discussed with: CRNA and Surgeon  Anesthesia Plan Comments:         Anesthesia Quick Evaluation

## 2015-04-23 NOTE — Interval H&P Note (Signed)
History and Physical Interval Note:  04/23/2015 8:56 AM  Tommy [name]  has presented today for surgery, with the diagnosis of PROSTATE CANCER   The various methods of treatment have been discussed with the patient and family. After consideration of risks, benefits and other options for treatment, the patient has consented to  Procedure(s): RADIOACTIVE SEED IMPLANT/BRACHYTHERAPY IMPLANT (N/A) as a surgical intervention .  The patient's history has been reviewed, patient examined, no change in status, stable for surgery.  I have reviewed the patient's chart and labs.  Questions were answered to the patient's satisfaction.     Doylene Splinter J

## 2015-04-27 ENCOUNTER — Encounter (HOSPITAL_BASED_OUTPATIENT_CLINIC_OR_DEPARTMENT_OTHER): Payer: Self-pay | Admitting: Urology

## 2015-05-11 ENCOUNTER — Telehealth: Payer: Self-pay | Admitting: *Deleted

## 2015-05-11 NOTE — Telephone Encounter (Signed)
CALLED PATIENT TO INFORM THAT [facility]PT[facility]. CHANGED BEEN CHANGED ON 05-21-15, NO ANSWER MAILED LETTER.

## 2015-05-18 ENCOUNTER — Encounter: Payer: Self-pay | Admitting: Family Medicine

## 2015-05-18 ENCOUNTER — Ambulatory Visit (INDEPENDENT_AMBULATORY_CARE_PROVIDER_SITE_OTHER): Payer: BLUE CROSS/BLUE SHIELD | Admitting: Family Medicine

## 2015-05-18 VITALS — BP 137/85 | HR 89 | Temp 98.0°F | Ht 71.0 in | Wt 194.6 lb

## 2015-05-18 DIAGNOSIS — E785 Hyperlipidemia, unspecified: Secondary | ICD-10-CM | POA: Diagnosis not present

## 2015-05-18 DIAGNOSIS — R899 Unspecified abnormal finding in specimens from other organs, systems and tissues: Secondary | ICD-10-CM

## 2015-05-18 DIAGNOSIS — R748 Abnormal levels of other serum enzymes: Secondary | ICD-10-CM | POA: Insufficient documentation

## 2015-05-18 DIAGNOSIS — I739 Peripheral vascular disease, unspecified: Secondary | ICD-10-CM | POA: Diagnosis not present

## 2015-05-18 DIAGNOSIS — I10 Essential (primary) hypertension: Secondary | ICD-10-CM | POA: Diagnosis not present

## 2015-05-18 DIAGNOSIS — N400 Enlarged prostate without lower urinary tract symptoms: Secondary | ICD-10-CM | POA: Diagnosis not present

## 2015-05-18 MED ORDER — TAMSULOSIN HCL 0.4 MG PO CAPS: 0.4000 mg | ORAL_CAPSULE | Freq: Every day | ORAL | Status: DC

## 2015-05-18 MED ORDER — FENOFIBRATE 145 MG PO TABS: 145.0000 mg | ORAL_TABLET | Freq: Every morning | ORAL | Status: DC

## 2015-05-18 MED ORDER — ROSUVASTATIN CALCIUM 10 MG PO TABS: 10.0000 mg | ORAL_TABLET | Freq: Every evening | ORAL | Status: DC

## 2015-05-18 MED ORDER — LISINOPRIL-HYDROCHLOROTHIAZIDE 20-25 MG PO TABS: ORAL_TABLET | ORAL | Status: DC

## 2015-05-18 NOTE — Progress Notes (Signed)
BP 137/85 mmHg  Pulse 89  Temp(Src) 98 F (36.7 C) (Oral)  Ht 5\' 11"  (1.803 m)  Wt 194 lb 9.6 oz (88.27 kg)  BMI 27.15 kg/m2   Subjective:    Patient ID: Tommy [name], male    DOB: Jul 14, 1951, 64 y.o.   MRN: [id number]  HPI: Tommy [name] is a 64 y.o. male presenting on 05/18/2015 for Refill medications and Mouth Lesions   HPI Hypertension Patient has hypertension for many years. His blood pressure today is 137/85. Per patient it typically runs in the 120/80 range at home. He denies any headaches or blurred vision. Denies any weakness or numbness.  Hyperlipidemia Patient has been taking TriCor and Crestor without any major side effects. Denies any muscle cramps and has never had any liver problems that he knows. No recent lipid panel.  Elevated creatinine kinase Patient had an elevated creatinine kinase on this his lab last time. It was 1.39, with a GFR 51. Prior to that in December 2015his creatinine current kinase was 1.18 with a GFR 64. This lab value was taken in July 2016 just prior to his surgery, so he was in a fasting state.  Prostate He has had time with BPH for  some years and was recently diagnosed with low-grade prostate cancer. He had surgery with radiation therapy seeding in July. He continues to take Flomax and it is prescribed from our clinic but the situation is mostly being managed by his urologist.  Relevant past medical, surgical, family and social history reviewed and updated as indicated. Interim medical history since our last visit reviewed. Allergies and medications reviewed and updated.  Review of Systems  Constitutional: Negative for fever and chills.  HENT: Negative for ear discharge and ear pain.   Eyes: Negative for discharge and visual disturbance.  Respiratory: Negative for shortness of breath and wheezing.   Cardiovascular: Negative for chest pain and leg swelling.  Gastrointestinal: Negative for abdominal pain, diarrhea, constipation and abdominal  distention.  Genitourinary: Positive for urgency (he has had some urgency since his prostate radiation surgery). Negative for dysuria and difficulty urinating.  Musculoskeletal: Negative for back pain and gait problem.  Skin: Negative for rash.  Neurological: Negative for syncope, light-headedness and headaches.    Per HPI unless specifically indicated above     Medication List       This list is accurate as of: 05/18/15  1:20 PM.  Always use your most recent med list.               clopidogrel 75 MG tablet  Commonly known as:  PLAVIX  Take 1 tablet (75 mg total) by mouth daily.     fenofibrate 145 MG tablet  Commonly known as:  TRICOR  Take 1 tablet (145 mg total) by mouth every morning.     lisinopril-hydrochlorothiazide 20-25 MG per tablet  Commonly known as:  PRINZIDE,ZESTORETIC  Take 1 tablet by mouth daily.   TAKES IN AM     MULTIVITAMIN & MINERAL PO  Take by mouth.     rosuvastatin 10 MG tablet  Commonly known as:  CRESTOR  Take 1 tablet (10 mg total) by mouth every evening.     tamsulosin 0.4 MG Caps capsule  Commonly known as:  FLOMAX  Take 1 capsule (0.4 mg total) by mouth daily after supper. TAKE (1) CAPSULE DAILY           Objective:    BP 137/85 mmHg  Pulse 89  Temp(Src)  98 F (36.7 C) (Oral)  Ht 5\' 11"  (1.803 m)  Wt 194 lb 9.6 oz (88.27 kg)  BMI 27.15 kg/m2  Wt Readings from Last 3 Encounters:  05/18/15 194 lb 9.6 oz (88.27 kg)  04/23/15 196 lb (88.905 kg)  02/02/15 201 lb 14.4 oz (91.581 kg)    Physical Exam  Constitutional: He is oriented to person, place, and time. He appears well-developed and well-nourished. No distress.  Eyes: Conjunctivae and EOM are normal. Pupils are equal, round, and reactive to light. Right eye exhibits no discharge. No scleral icterus.  Cardiovascular: Normal rate, regular rhythm, normal heart sounds and intact distal pulses.   No murmur heard. Pulmonary/Chest: Effort normal and breath sounds normal. No  respiratory distress. He has no wheezes.  Abdominal: He exhibits no distension. There is no tenderness. There is no rebound and no guarding.  Musculoskeletal: Normal range of motion. He exhibits no edema.  Neurological: He is alert and oriented to person, place, and time. Coordination normal.  Skin: Skin is warm and dry. No rash noted. He is not diaphoretic.  Psychiatric: He has a normal mood and affect. His behavior is normal.  Vitals reviewed.   Results for orders placed or performed during the hospital encounter of 04/23/15  APTT  Result Value Ref Range   aPTT 27 24 - 37 seconds  CBC  Result Value Ref Range   WBC 3.9 (L) 4.0 - 10.5 K/uL   RBC 4.44 4.22 - 5.81 MIL/uL   Hemoglobin 13.1 13.0 - 17.0 g/dL   HCT 38.3 (L) 39.0 - 52.0 %   MCV 86.3 78.0 - 100.0 fL   MCH 29.5 26.0 - 34.0 pg   MCHC 34.2 30.0 - 36.0 g/dL   RDW 13.0 11.5 - 15.5 %   Platelets 166 150 - 400 K/uL  Comprehensive metabolic panel  Result Value Ref Range   Sodium 139 135 - 145 mmol/L   Potassium 4.6 3.5 - 5.1 mmol/L   Chloride 103 101 - 111 mmol/L   CO2 31 22 - 32 mmol/L   Glucose, Bld 116 (H) 65 - 99 mg/dL   BUN 25 (H) 6 - 20 mg/dL   Creatinine, Ser 1.39 (H) 0.61 - 1.24 mg/dL   Calcium 10.5 (H) 8.9 - 10.3 mg/dL   Total Protein 7.1 6.5 - 8.1 g/dL   Albumin 4.5 3.5 - 5.0 g/dL   AST 38 15 - 41 U/L   ALT 37 17 - 63 U/L   Alkaline Phosphatase 28 (L) 38 - 126 U/L   Total Bilirubin 0.8 0.3 - 1.2 mg/dL   GFR calc non Af Amer 51 (L) >60 mL/min   GFR calc Af Amer 59 (L) >60 mL/min   Anion gap 5 5 - 15  Protime-INR  Result Value Ref Range   Prothrombin Time 14.6 11.6 - 15.2 seconds   INR 1.12 0.00 - 1.49      Assessment & Plan:   Problem List Items Addressed This Visit    BPH (benign prostatic hypertrophy)    Refill medication, sees urology and recently had radiation treatment for prostate cancer      Relevant Medications   tamsulosin (FLOMAX) 0.4 MG CAPS capsule   PAD (peripheral artery disease)     Pt sees vascular for this yearly, he continues to take Plavix. He has occasional small bruising, but no major bleeding. Continue Plavix as patient has had stents in both legs.      Relevant Medications   fenofibrate (TRICOR) 145 MG  tablet   lisinopril-hydrochlorothiazide (PRINZIDE,ZESTORETIC) 20-25 MG per tablet   rosuvastatin (CRESTOR) 10 MG tablet   HTN (hypertension)    Blood pressure controlled on current medications, no side effects. Will check renal function because slightly elevated 1 month ago pre-surgery.      Relevant Medications   Multiple Vitamins-Minerals (MULTIVITAMIN & MINERAL PO)   fenofibrate (TRICOR) 145 MG tablet   lisinopril-hydrochlorothiazide (PRINZIDE,ZESTORETIC) 20-25 MG per tablet   rosuvastatin (CRESTOR) 10 MG tablet   Hyperlipidemia LDL goal <130 - Primary   Relevant Medications   fenofibrate (TRICOR) 145 MG tablet   lisinopril-hydrochlorothiazide (PRINZIDE,ZESTORETIC) 20-25 MG per tablet   rosuvastatin (CRESTOR) 10 MG tablet   Other Relevant Orders   Lipid Panel   Hepatic Function Panel   Elevated creatine kinase   Relevant Orders   Basic Metabolic Panel       Follow up plan: Return in about 4 weeks (around 06/15/2015), or if symptoms worsen or fail to improve. Patient to return for wellness physical.  Caryl Pina, MD [facility] 05/18/2015, 1:20 PM

## 2015-05-18 NOTE — Assessment & Plan Note (Signed)
Refill medication, sees urology and recently had radiation treatment for prostate cancer

## 2015-05-18 NOTE — Assessment & Plan Note (Signed)
Taking crestor, check lipid panel.

## 2015-05-18 NOTE — Patient Instructions (Signed)

## 2015-05-18 NOTE — Assessment & Plan Note (Signed)
Pt sees vascular for this yearly, he continues to take Plavix. He has occasional small bruising, but no major bleeding. Continue Plavix as patient has had stents in both legs.

## 2015-05-18 NOTE — Assessment & Plan Note (Signed)
Blood pressure controlled on current medications, no side effects. Will check renal function because slightly elevated 1 month ago pre-surgery.

## 2015-05-19 LAB — BASIC METABOLIC PANEL
BUN / CREAT RATIO: 12 (ref 10–22)
BUN: 15 mg/dL (ref 8–27)
CALCIUM: 10.7 mg/dL — AB (ref 8.6–10.2)
CO2: 24 mmol/L (ref 18–29)
Chloride: 107 mmol/L (ref 97–108)
Creatinine, Ser: 1.29 mg/dL — ABNORMAL HIGH (ref 0.76–1.27)
GFR calc Af Amer: 66 mL/min/{1.73_m2} (ref >59)
GFR calc non Af Amer: 57 mL/min/{1.73_m2} — ABNORMAL LOW (ref >59)
GLUCOSE: 96 mg/dL (ref 65–99)
Potassium: 5.8 mmol/L — ABNORMAL HIGH (ref 3.5–5.2)
Sodium: 147 mmol/L — ABNORMAL HIGH (ref 134–144)

## 2015-05-19 LAB — HEPATIC FUNCTION PANEL
ALK PHOS: 32 IU/L — AB (ref 39–117)
ALT: 38 IU/L (ref 0–44)
AST: 28 IU/L (ref 0–40)
Albumin: 4.5 g/dL (ref 3.6–4.8)
BILIRUBIN, DIRECT: 0.23 mg/dL (ref 0.00–0.40)
Bilirubin Total: 0.6 mg/dL (ref 0.0–1.2)
Total Protein: 6.7 g/dL (ref 6.0–8.5)

## 2015-05-19 LAB — LIPID PANEL
Chol/HDL Ratio: 3.5 ratio units (ref 0.0–5.0)
Cholesterol, Total: 125 mg/dL (ref 100–199)
HDL: 36 mg/dL — AB (ref >39)
LDL CALC: 69 mg/dL (ref 0–99)
Triglycerides: 102 mg/dL (ref 0–149)
VLDL Cholesterol Cal: 20 mg/dL (ref 5–40)

## 2015-05-20 ENCOUNTER — Telehealth: Payer: Self-pay | Admitting: *Deleted

## 2015-05-20 NOTE — Telephone Encounter (Signed)
Called patient to remind of appts. For 05-21-15, no answer

## 2015-05-20 NOTE — Addendum Note (Signed)
Addended by: Caryl Pina on: 05/20/2015 08:32 PM   Modules accepted: Orders

## 2015-05-20 NOTE — Progress Notes (Signed)
Patient had elevated calcium and elevated potassium and elevated sodium. We'll retest labs, if still abnormal will do further testing.  Caryl Pina, MD [facility] 05/20/2015, 8:32 PM

## 2015-05-21 ENCOUNTER — Encounter: Payer: Self-pay | Admitting: Radiation Oncology

## 2015-05-21 ENCOUNTER — Ambulatory Visit: Payer: Medicare Other | Admitting: Radiation Oncology

## 2015-05-21 ENCOUNTER — Ambulatory Visit
Admission: RE | Admit: 2015-05-21 | Discharge: 2015-05-21 | Disposition: A | Discharge status: Home / Self Care | Payer: BLUE CROSS/BLUE SHIELD | Source: Ambulatory Visit | Attending: Radiation Oncology | Admitting: Radiation Oncology

## 2015-05-21 ENCOUNTER — Other Ambulatory Visit: Payer: Self-pay | Admitting: *Deleted

## 2015-05-21 VITALS — BP 130/81 | HR 74 | Resp 16 | Wt 196.2 lb

## 2015-05-21 DIAGNOSIS — C61 Malignant neoplasm of prostate: Secondary | ICD-10-CM

## 2015-05-21 NOTE — Progress Notes (Signed)
  Radiation Oncology         [phone] ________________________________  Name: Tommy [name]  MRN: [id number]  Date: 05/21/2015  DOB: 23-Mar-1951  COMPLEX SIMULATION NOTE  NARRATIVE:  The patient was brought to the [place] suite today following prostate seed implantation approximately one month ago.  Identity was confirmed.  All relevant records and images related to the planned course of therapy were reviewed.  Then, the patient was set-up supine.  CT images were obtained.  The CT images were loaded into the planning software.  Then the prostate and rectum were contoured.  Treatment planning then occurred.  The implanted iodine 125 seeds were identified by the physics staff for projection of radiation distribution  I have requested : 3D Simulation  I have requested a DVH of the following structures: Prostate and rectum.    This document serves as a record of services personally performed by Tyler Pita, MD. It was created on his behalf by Darcus Austin, a trained medical scribe. The creation of this record is based on the scribe's personal observations and the provider's statements to them. This document has been checked and approved by the attending provider.     ________________________________  Sheral Apley. Tammi Klippel, M.D.

## 2015-05-21 NOTE — Progress Notes (Signed)
Radiation Oncology         [phone] ________________________________  Name: Tommy [name] MRN: [id number]  Date: 05/21/2015  DOB: Sep 25, 1951  Follow-Up Visit Note  CC: Redge Gainer, MD  Irine Seal, MD  Diagnosis:  64 y.o. gentleman with stage T1c adenocarcinoma of the prostate with a Gleason's score of 3+4 and a PSA of 5.73.    ICD-9-CM ICD-10-CM   1. Stage T1c adenocarcinoma of the prostate with a Gleason's Score of 3+4 and a PSA of 5.73 185 C61     Interval Since Last Radiation:  04/23/15 (brachytherapy)  Narrative:  The patient returns today for routine follow-up.  He is complaining of increased urinary frequency and urinary hesitation symptoms. He filled out a questionnaire regarding urinary function today providing and overall IPSS score of 3 characterizing his symptoms as mild.  His pre-implant score was 2. He denies any bowel symptoms.  ALLERGIES:  is allergic to penicillins.  Meds: Current Outpatient Prescriptions  Medication Sig Dispense Refill  . clopidogrel (PLAVIX) 75 MG tablet Take 1 tablet (75 mg total) by mouth daily. 30 tablet 5  . fenofibrate (TRICOR) 145 MG tablet Take 1 tablet (145 mg total) by mouth every morning. 30 tablet 5  . lisinopril-hydrochlorothiazide (PRINZIDE,ZESTORETIC) 20-25 MG per tablet Take 1 tablet by mouth daily.   TAKES IN AM 30 tablet 5  . Multiple Vitamins-Minerals (MULTIVITAMIN & MINERAL PO) Take by mouth.    . rosuvastatin (CRESTOR) 10 MG tablet Take 1 tablet (10 mg total) by mouth every evening. 30 tablet 5  . tamsulosin (FLOMAX) 0.4 MG CAPS capsule Take 1 capsule (0.4 mg total) by mouth daily after supper. TAKE (1) CAPSULE DAILY 30 capsule 5   No current facility-administered medications for this encounter.    Physical Findings: The patient is in no acute distress. Patient is alert and oriented.  weight is 196 lb 3.2 oz (88.996 kg). His blood pressure is 130/81 and his pulse is 74. His respiration is 16. .  No significant  changes.  Lab Findings: Lab Results  Component Value Date   WBC 3.9* 04/16/2015   HGB 13.1 04/16/2015   HCT 38.3* 04/16/2015   MCV 86.3 04/16/2015   PLT 166 04/16/2015    Radiographic Findings:  Patient underwent CT imaging in our clinic for post implant dosimetry. The CT appears to demonstrate an adequate distribution of radioactive seeds throughout the prostate gland. There no seeds in her near the rectum. I suspect the final radiation plan and dosimetry will show appropriate coverage of the prostate gland.   Impression: The patient is recovering from the effects of radiation. His urinary symptoms should gradually improve over the next 4-6 months. We talked about this today. He is encouraged by his improvement already and is otherwise please with his outcome.   Plan: Today, I spent time talking to the patient about his prostate seed implant and resolving urinary symptoms. We also talked about long-term follow-up for prostate cancer following seed implant. He understands that ongoing PSA determinations and digital rectal exams will help perform surveillance to rule out disease recurrence. He understands what to expect with his PSA measures. Patient was also educated today about some of the long-term effects from radiation including a small risk for rectal bleeding and possibly erectile dysfunction. We talked about some of the general management approaches to these potential complications. However, I did encourage the patient to contact our office or return at any point if he has questions or concerns related to his  previous radiation and prostate cancer.  This document serves as a record of services personally performed by Tyler Pita, MD. It was created on his behalf by Darcus Austin, a trained medical scribe. The creation of this record is based on the scribe's personal observations and the provider's statements to them. This document has been checked and approved by the attending provider.      _____________________________________  Sheral Apley. Tammi Klippel, M.D.

## 2015-05-21 NOTE — Progress Notes (Signed)
Weight and vitals stable. Denies pain. Denies dysuria. Reports urgency and frequency. Pre seed IPSS 2. Post seed IPSS 3. States, "I feel great." Denies hematuria. Denies diarrhea.   BP 130/81 mmHg  Pulse 74  Resp 16  Wt 196 lb 3.2 oz (88.996 kg) Wt Readings from Last 3 Encounters:  05/21/15 196 lb 3.2 oz (88.996 kg)  05/18/15 194 lb 9.6 oz (88.27 kg)  04/23/15 196 lb (88.905 kg)

## 2015-05-27 ENCOUNTER — Telehealth: Payer: Self-pay | Admitting: Family Medicine

## 2015-05-27 DIAGNOSIS — E875 Hyperkalemia: Secondary | ICD-10-CM

## 2015-06-01 ENCOUNTER — Other Ambulatory Visit: Payer: Self-pay | Admitting: Family Medicine

## 2015-06-01 NOTE — Telephone Encounter (Signed)
Patient had hyperkalemia and I would like him to come in for repeat lab could at least try to recontact this patient.

## 2015-06-02 ENCOUNTER — Other Ambulatory Visit (INDEPENDENT_AMBULATORY_CARE_PROVIDER_SITE_OTHER): Payer: BLUE CROSS/BLUE SHIELD

## 2015-06-02 DIAGNOSIS — E875 Hyperkalemia: Secondary | ICD-10-CM | POA: Diagnosis not present

## 2015-06-02 NOTE — Progress Notes (Signed)
Lab only 

## 2015-06-03 LAB — CMP14+EGFR
ALT: 40 IU/L (ref 0–44)
AST: 30 IU/L (ref 0–40)
Albumin/Globulin Ratio: 1.9 (ref 1.1–2.5)
Albumin: 4.4 g/dL (ref 3.6–4.8)
Alkaline Phosphatase: 31 IU/L — ABNORMAL LOW (ref 39–117)
BILIRUBIN TOTAL: 0.9 mg/dL (ref 0.0–1.2)
BUN/Creatinine Ratio: 18 (ref 10–22)
BUN: 21 mg/dL (ref 8–27)
CALCIUM: 10.6 mg/dL — AB (ref 8.6–10.2)
CO2: 24 mmol/L (ref 18–29)
Chloride: 104 mmol/L (ref 97–108)
Creatinine, Ser: 1.14 mg/dL (ref 0.76–1.27)
GFR calc Af Amer: 77 mL/min/{1.73_m2} (ref >59)
GFR, EST NON AFRICAN AMERICAN: 67 mL/min/{1.73_m2} (ref >59)
GLOBULIN, TOTAL: 2.3 g/dL (ref 1.5–4.5)
Glucose: 96 mg/dL (ref 65–99)
POTASSIUM: 5.1 mmol/L (ref 3.5–5.2)
Sodium: 144 mmol/L (ref 134–144)
Total Protein: 6.7 g/dL (ref 6.0–8.5)

## 2015-06-12 ENCOUNTER — Encounter (HOSPITAL_BASED_OUTPATIENT_CLINIC_OR_DEPARTMENT_OTHER): Payer: Self-pay | Admitting: Urology

## 2015-06-18 ENCOUNTER — Encounter: Payer: BLUE CROSS/BLUE SHIELD | Admitting: Family Medicine

## 2015-06-19 ENCOUNTER — Encounter (INDEPENDENT_AMBULATORY_CARE_PROVIDER_SITE_OTHER): Payer: Self-pay

## 2015-06-19 ENCOUNTER — Encounter: Payer: Self-pay | Admitting: Family Medicine

## 2015-06-19 ENCOUNTER — Ambulatory Visit (INDEPENDENT_AMBULATORY_CARE_PROVIDER_SITE_OTHER): Payer: BLUE CROSS/BLUE SHIELD | Admitting: Family Medicine

## 2015-06-19 VITALS — BP 136/76 | HR 67 | Temp 97.6°F | Ht 71.0 in | Wt 195.2 lb

## 2015-06-19 DIAGNOSIS — E875 Hyperkalemia: Secondary | ICD-10-CM | POA: Insufficient documentation

## 2015-06-19 DIAGNOSIS — Z0000 Encounter for general adult medical examination without abnormal findings: Secondary | ICD-10-CM

## 2015-06-19 NOTE — Patient Instructions (Signed)
Hyperkalemia Hyperkalemia is when you have too much potassium in your blood. This can be a life-threatening condition. Potassium is normally removed (excreted) from the body by the kidneys. CAUSES  The potassium level in your body can become too high for the following reasons:  You take in too much potassium. You can do this by:  Using salt substitutes. They contain large amounts of potassium.  Taking potassium supplements from your caregiver. The dose may be too high for you.  Eating foods or taking nutritional products with potassium.  You excrete too little potassium. This can happen if:  Your kidneys are not functioning properly. Kidney (renal) disease is a very common cause of hyperkalemia.  You are taking medicines that lower your excretion of potassium, such as certain diuretic medicines.  You have an adrenal gland disease called Addison's disease.  You have a urinary tract obstruction, such as kidney stones.  You are on treatment to mechanically clean your blood (dialysis) and you skip a treatment.  You release a high amount of potassium from your cells into your blood. You may have a condition that causes potassium to move from your cells to your bloodstream. This can happen with:  Injury to muscles or other tissues. Most potassium is stored in the muscles.  Severe burns or infections.  Acidic blood plasma (acidosis). Acidosis can result from many diseases, such as uncontrolled diabetes. SYMPTOMS  Usually, there are no symptoms unless the potassium is dangerously high or has risen very quickly. Symptoms may include:  Irregular or very slow heartbeat.  Feeling sick to your stomach (nauseous).  Tiredness (fatigue).  Nerve problems such as tingling of the skin, numbness of the hands or feet, weakness, or paralysis. DIAGNOSIS  A simple blood test can measure the amount of potassium in your body. An electrocardiogram test of the heart can also help make the diagnosis.  The heart may beat dangerously fast or slow down and stop beating with severe hyperkalemia.  TREATMENT  Treatment depends on how bad the condition is and on the underlying cause.  If the hyperkalemia is an emergency (causing heart problems or paralysis), many different medicines can be used alone or together to lower the potassium level briefly. This may include an insulin injection even if you are not diabetic. Emergency dialysis may be needed to remove potassium from the body.  If the hyperkalemia is less severe or dangerous, the underlying cause is treated. This can include taking medicines if needed. Your prescription medicines may be changed. You may also need to take a medicine to help your body get rid of potassium. You may need to eat a diet low in potassium. HOME CARE INSTRUCTIONS   Take medicines and supplements as directed by your caregiver.  Do not take any over-the-counter medicines, supplements, natural products, herbs, or vitamins without reviewing them with your caregiver. Certain supplements and natural food products can have high amounts of potassium. Other products (such as ibuprofen) can damage weak kidneys and raise your potassium.  You may be asked to do repeat lab tests. Be sure to follow these directions.  If you have kidney disease, you may need to follow a low potassium diet. SEEK MEDICAL CARE IF:   You notice an irregular or very slow heartbeat.  You feel lightheaded.  You develop weakness that is unusual for you. SEEK IMMEDIATE MEDICAL CARE IF:   You have shortness of breath.  You have chest discomfort.  You pass out (faint). MAKE SURE YOU:   Understand   these instructions.  Will watch your condition.  Will get help right away if you are not doing well or get worse. Document Released: 09/16/2002 Document Revised: 12/19/2011 Document Reviewed: 01/01/2014 [employer] Patient Information 2015 [employer]. This information is not intended to replace  advice given to you by your health care provider. Make sure you discuss any questions you have with your health care provider.  

## 2015-06-19 NOTE — Progress Notes (Signed)
BP 136/76 mmHg  Pulse 67  Temp(Src) 97.6 F (36.4 C) (Oral)  Ht $R'5\' 11"'UB$  (1.803 m)  Wt 195 lb 3.2 oz (88.542 kg)  BMI 27.24 kg/m2   Subjective:    Patient ID: Tommy [name], male    DOB: 1951-09-15, 65 y.o.   MRN: [id number]  HPI: Tommy [name] is a 64 y.o. male presenting on 06/19/2015 for Annual Exam   HPI Annual exam  Patient presents today for follow-up on hyperkalemia and for annual physical exam. He does admit to frequency and urgency since he has had his prostate cancer treatment but it is improving. He said follows up with urology for this. Patient denies headaches, blurred vision, chest pains, shortness of breath, or weakness. Denies any side effects from medication and is content with current medication. Overall no other major complaints.  Hyperkalemia/acute on chronic kidney changes  Patient presents for follow-up on his hyperkalemia that he had on his labs on the first visit. His potassium was up to 5.8 and his creatinine was 1.29 which was actually improved from previous 1.39. On repeat labs on 06/02/2015 patient's potassium came down to 5.1 and his creatinine improved to 1.14. He denies any palpitations or chest pains, also denies any lightheadedness or dizziness.  Relevant past medical, surgical, family and social history reviewed and updated as indicated. Interim medical history since our last visit reviewed. Allergies and medications reviewed and updated.  Review of Systems  Constitutional: Negative for fever and chills.  HENT: Negative for congestion, ear discharge, ear pain and sore throat.   Eyes: Negative for discharge and visual disturbance.  Respiratory: Negative for cough, chest tightness, shortness of breath and wheezing.   Cardiovascular: Negative for chest pain, palpitations and leg swelling.  Gastrointestinal: Negative for nausea, vomiting, abdominal pain, diarrhea and constipation.  Endocrine: Negative for cold intolerance and heat intolerance.  Genitourinary:  Positive for urgency and frequency. Negative for dysuria, decreased urine volume and difficulty urinating.  Musculoskeletal: Negative for back pain, arthralgias and gait problem.  Skin: Negative for rash.  Neurological: Negative for dizziness, syncope, light-headedness and headaches.  Psychiatric/Behavioral: Negative for suicidal ideas and dysphoric mood.  All other systems reviewed and are negative.   Per HPI unless specifically indicated above     Medication List       This list is accurate as of: 06/19/15 11:11 AM.  Always use your most recent med list.               clopidogrel 75 MG tablet  Commonly known as:  PLAVIX  Take 1 tablet (75 mg total) by mouth daily.     fenofibrate 145 MG tablet  Commonly known as:  TRICOR  Take 1 tablet (145 mg total) by mouth every morning.     lisinopril-hydrochlorothiazide 20-25 MG per tablet  Commonly known as:  PRINZIDE,ZESTORETIC  Take 1 tablet by mouth daily.   TAKES IN AM     MULTIVITAMIN & MINERAL PO  Take by mouth.     rosuvastatin 10 MG tablet  Commonly known as:  CRESTOR  Take 1 tablet (10 mg total) by mouth every evening.     tamsulosin 0.4 MG Caps capsule  Commonly known as:  FLOMAX  Take 1 capsule (0.4 mg total) by mouth daily after supper. TAKE (1) CAPSULE DAILY           Objective:    BP 136/76 mmHg  Pulse 67  Temp(Src) 97.6 F (36.4 C) (Oral)  Ht $R'5\' 11"'mv$  (  1.803 m)  Wt 195 lb 3.2 oz (88.542 kg)  BMI 27.24 kg/m2  Wt Readings from Last 3 Encounters:  06/19/15 195 lb 3.2 oz (88.542 kg)  05/21/15 196 lb 3.2 oz (88.996 kg)  05/18/15 194 lb 9.6 oz (88.27 kg)    Physical Exam  Constitutional: He is oriented to person, place, and time. He appears well-developed and well-nourished. No distress.  HENT:  Right Ear: External ear normal.  Left Ear: External ear normal.  Nose: Nose normal.  Mouth/Throat: Oropharynx is clear and moist. No oropharyngeal exudate.  Eyes: Conjunctivae and EOM are normal. Right eye  exhibits no discharge. No scleral icterus.  Neck: Neck supple. No thyromegaly present.  Cardiovascular: Normal rate, regular rhythm, normal heart sounds and intact distal pulses.   No murmur heard. Pulmonary/Chest: Effort normal and breath sounds normal. No respiratory distress. He has no wheezes. He has no rales.  Abdominal: Soft. Bowel sounds are normal. He exhibits no distension. There is no tenderness. There is no rebound and no guarding.  Genitourinary:  Deferred prostate exam because patient sees urology.  Musculoskeletal: Normal range of motion. He exhibits no edema.  Lymphadenopathy:    He has no cervical adenopathy.  Neurological: He is alert and oriented to person, place, and time. Coordination normal.  Skin: Skin is warm and dry. No rash noted. He is not diaphoretic.  Psychiatric: He has a normal mood and affect. His behavior is normal.  Vitals reviewed.   Results for orders placed or performed in visit on 06/02/15  CMP14+EGFR  Result Value Ref Range   Glucose 96 65 - 99 mg/dL   BUN 21 8 - 27 mg/dL   Creatinine, Ser 1.14 0.76 - 1.27 mg/dL   GFR calc non Af Amer 67 >59 mL/min/1.73   GFR calc Af Amer 77 >59 mL/min/1.73   BUN/Creatinine Ratio 18 10 - 22   Sodium 144 134 - 144 mmol/L   Potassium 5.1 3.5 - 5.2 mmol/L   Chloride 104 97 - 108 mmol/L   CO2 24 18 - 29 mmol/L   Calcium 10.6 (H) 8.6 - 10.2 mg/dL   Total Protein 6.7 6.0 - 8.5 g/dL   Albumin 4.4 3.6 - 4.8 g/dL   Globulin, Total 2.3 1.5 - 4.5 g/dL   Albumin/Globulin Ratio 1.9 1.1 - 2.5   Bilirubin Total 0.9 0.0 - 1.2 mg/dL   Alkaline Phosphatase 31 (L) 39 - 117 IU/L   AST 30 0 - 40 IU/L   ALT 40 0 - 44 IU/L      Assessment & Plan:   Problem List Items Addressed This Visit      Other   Annual physical exam - Primary    No major physical issues or complaints except the frequency and urgency that goes along with this prostate issues. He continues to see urology for those.      Hyperkalemia    Resolved  hyperkalemia on most recent labs. We'll repeat again in a couple months. Gave patient education on avoiding high potassium foods and staying hydrated.      Relevant Orders   BMP8+EGFR       Follow up plan: Return in about 6 months (around 12/17/2015).  Caryl Pina, MD [facility] 06/19/2015, 11:11 AM

## 2015-06-22 NOTE — Assessment & Plan Note (Signed)
No major physical issues or complaints except the frequency and urgency that goes along with this prostate issues. He continues to see urology for those.

## 2015-06-22 NOTE — Assessment & Plan Note (Signed)
Resolved hyperkalemia on most recent labs. We'll repeat again in a couple months. Gave patient education on avoiding high potassium foods and staying hydrated.

## 2015-06-23 ENCOUNTER — Encounter: Payer: Self-pay | Admitting: Radiation Oncology

## 2015-06-23 DIAGNOSIS — C61 Malignant neoplasm of prostate: Secondary | ICD-10-CM | POA: Diagnosis not present

## 2015-07-20 ENCOUNTER — Ambulatory Visit (INDEPENDENT_AMBULATORY_CARE_PROVIDER_SITE_OTHER): Payer: BLUE CROSS/BLUE SHIELD | Admitting: *Deleted

## 2015-07-20 DIAGNOSIS — Z23 Encounter for immunization: Secondary | ICD-10-CM | POA: Diagnosis not present

## 2015-08-02 NOTE — Progress Notes (Signed)
  Radiation Oncology         [phone] ________________________________  Name: Tommy [name] MRN: [id number]  Date: 06/23/2015  DOB: 1950-12-30  3D Planning Note   Prostate Brachytherapy Post-Implant Dosimetry  Diagnosis: 64 y.o. gentleman with stage T1c adenocarcinoma of the prostate with a Gleason's score of 3+4 and a PSA of 5.73  Narrative: On a previous date, [name] returned following prostate seed implantation for post implant planning. He underwent CT scan complex simulation to delineate the three-dimensional structures of the pelvis and demonstrate the radiation distribution.  Since that time, the seed localization, and complex isodose planning with dose volume histograms have now been completed.  Results:   Prostate Coverage - The dose of radiation delivered to the 90% or more of the prostate gland (D90) was 116.99% of the prescription dose. This exceeds our goal of greater than 90%. Rectal Sparing - The volume of rectal tissue receiving the prescription dose or higher was 0.00 cc. This falls under our thresholds tolerance of 1.0 cc.  Impression: The prostate seed implant appears to show adequate target coverage and appropriate rectal sparing.  Plan:  The patient will continue to follow with urology for ongoing PSA determinations. I would anticipate a high likelihood for local tumor control with minimal risk for rectal morbidity.  ________________________________  Sheral Apley Tammi Klippel, M.D.

## 2015-08-20 ENCOUNTER — Encounter: Payer: Self-pay | Admitting: Family

## 2015-08-24 ENCOUNTER — Encounter: Payer: Self-pay | Admitting: Family

## 2015-08-24 ENCOUNTER — Ambulatory Visit (INDEPENDENT_AMBULATORY_CARE_PROVIDER_SITE_OTHER)
Admission: RE | Admit: 2015-08-24 | Discharge: 2015-08-24 | Disposition: A | Discharge status: Home / Self Care | Payer: BLUE CROSS/BLUE SHIELD | Source: Ambulatory Visit | Attending: Family | Admitting: Family

## 2015-08-24 ENCOUNTER — Other Ambulatory Visit: Payer: Self-pay | Admitting: Family

## 2015-08-24 ENCOUNTER — Ambulatory Visit (INDEPENDENT_AMBULATORY_CARE_PROVIDER_SITE_OTHER): Payer: BLUE CROSS/BLUE SHIELD | Admitting: Family

## 2015-08-24 ENCOUNTER — Ambulatory Visit (HOSPITAL_COMMUNITY)
Admission: RE | Admit: 2015-08-24 | Discharge: 2015-08-24 | Disposition: A | Discharge status: Home / Self Care | Payer: BLUE CROSS/BLUE SHIELD | Source: Ambulatory Visit | Attending: Family | Admitting: Family

## 2015-08-24 VITALS — BP 123/82 | HR 76 | Temp 97.2°F | Resp 16 | Ht 71.0 in | Wt 196.0 lb

## 2015-08-24 DIAGNOSIS — Z959 Presence of cardiac and vascular implant and graft, unspecified: Secondary | ICD-10-CM

## 2015-08-24 DIAGNOSIS — I739 Peripheral vascular disease, unspecified: Secondary | ICD-10-CM | POA: Diagnosis not present

## 2015-08-24 DIAGNOSIS — Z95820 Peripheral vascular angioplasty status with implants and grafts: Secondary | ICD-10-CM

## 2015-08-24 DIAGNOSIS — Z87891 Personal history of nicotine dependence: Secondary | ICD-10-CM

## 2015-08-24 DIAGNOSIS — Z48812 Encounter for surgical aftercare following surgery on the circulatory system: Secondary | ICD-10-CM | POA: Diagnosis not present

## 2015-08-24 NOTE — Progress Notes (Signed)
[facility] HISTORY AND PHYSICAL -PAD  History of Present Illness Tommy [name] is a 64 y.o. male patient of Dr. Trula Slade who is status post left SFA stent October 2009 with PTA and second stent in February 2012. He returns today for follow up.  Patient denies claudication symptoms with walking, denies non healing ulcers on lower extremities.  He denies any history of stroke or TIA. He was diagnosed with prostate cancer, had seed implants in July 2016. He will be having a tooth extracted. He had profuse bleeding from a small laceration to his right 5th toe recently; was able to stop the bleeding with a pressure dressing.  Pt Diabetic: No Pt smoker: former smoker, quit in 2013  Pt meds include: Statin :Yes Betablocker: No ASA: no Other anticoagulants/antiplatelets: Plavix     Past Medical History  Diagnosis Date  . Peripheral vascular disease ([place])   . Hypertension   . Hyperlipidemia   . BPH with obstruction/lower urinary tract symptoms     hyperplasia  . ED (erectile dysfunction)     arterial insuff.  . History of gastric ulcer   . Wears glasses   . History of DVT of lower extremity   . Prostate cancer [facility]) DX 01/01/15--  urologist-  dr Jeffie Pollock    Stage T1c, Gleason 3+4, PSA 5.73, vol 63ml    Social History Social History  Substance Use Topics  . Smoking status: Former Smoker -- 1.00 packs/day for 15 years    Types: Cigarettes    Start date: 09/09/2010    Quit date: 04/21/2011  . Smokeless tobacco: Former Systems developer    Types: Snuff    Quit date: 04/23/2015     Comment: occasional  someday dips tobacco  . Alcohol Use: No    Family History Family History  Problem Relation Age of Onset  . Heart disease Mother   . Cancer Mother   . Deep vein thrombosis Mother   . Diabetes Mother   . Hyperlipidemia Mother   . Hypertension Mother   . Heart attack Mother   . Peripheral vascular disease Mother   . Cancer Father     Past Surgical History   Procedure Laterality Date  . Femoral artery stent Left 07-22-2008  &  11-16-2010    left SFA//   In-Stent restenosis  . Prostate biopsy  01/01/15  . Orif left index finger fx  09-28-2006  . Tonsillectomy  as child  . Radioactive seed implant N/A 04/23/2015    Procedure: RADIOACTIVE SEED IMPLANT/BRACHYTHERAPY IMPLANT;  Surgeon: Irine Seal, MD;  Location: [facility];  Service: Urology;  Laterality: N/A;  . Prostate surgery  July 2016    Seed Implant    Allergies  Allergen Reactions  . Penicillins Swelling    Current Outpatient Prescriptions  Medication Sig Dispense Refill  . clopidogrel (PLAVIX) 75 MG tablet Take 1 tablet (75 mg total) by mouth daily. 30 tablet 2  . fenofibrate (TRICOR) 145 MG tablet Take 1 tablet (145 mg total) by mouth every morning. 30 tablet 5  . lisinopril-hydrochlorothiazide (PRINZIDE,ZESTORETIC) 20-25 MG per tablet Take 1 tablet by mouth daily.   TAKES IN AM 30 tablet 5  . rosuvastatin (CRESTOR) 10 MG tablet Take 1 tablet (10 mg total) by mouth every evening. 30 tablet 5  . tamsulosin (FLOMAX) 0.4 MG CAPS capsule Take 1 capsule (0.4 mg total) by mouth daily after supper. TAKE (1) CAPSULE DAILY 30 capsule 5  . Multiple Vitamins-Minerals (MULTIVITAMIN &  MINERAL PO) Take by mouth.     No current facility-administered medications for this visit.    ROS: See HPI for pertinent positives and negatives.   Physical Examination  Filed Vitals:   08/24/15 1052  BP: 123/82  Pulse: 76  Temp: 97.2 F (36.2 C)  TempSrc: Oral  Resp: 16  Height: 5\' 11"  (1.803 m)  Weight: 196 lb (88.905 kg)  SpO2: 94%   Body mass index is 27.35 kg/(m^2).  General: A&O x 3, WDWN Gait: normal Eyes: PERRLA, Pulmonary: CTAB, without wheezes , rales or rhonchi Cardiac: regular rhythm, no detected murmur     Carotid Bruits Left Right   Negative Negative   Radial pulses palpable bilaterally at 2+ Aorta is not palpable    VASCULAR EXAM: Extremities without ischemic changes  without Gangrene; without open wounds. Dry small bandage on right 5th toe.     LE Pulses LEFT RIGHT       FEMORAL 2+ palpable 2+ palpable   POPLITEAL not palpable  not palpable   POSTERIOR TIBIAL  2+palpable  2+ palpable    DORSALIS PEDIS  ANTERIOR TIBIAL 2+palpable  2+palpable    Abdomen: soft, NT, no palpable masses Skin: no rashes, no ulcers Musculoskeletal: no muscle wasting or atrophy Neurologic: A&O X 3; Appropriate Affect, normal sensation, MOTOR FUNCTION: moving all extremities equally. Speech is fluent/normal. CN 2-12 intact.           Non-Invasive Vascular Imaging: DATE: 08/24/2015 ABI: RIGHT: 1.29 (1.28 (08/15/14), Waveforms: triphasic, TBI: 0.94;  LEFT: 1.20 (1.26), Waveforms: triphasic, TBI: 0.77 DUPLEX SCAN OF BYPASS: Left SFA stent is widely patent, all triphasic waveforms.   ASSESSMENT: Tommy [name] is a 63 y.o. male who is status post left SFA stent October 2009 with PTA and second stent in February 2012. He has no claudication symptoms with walking, no signs no signs of ischemia in his feet/legs.  Today's left LE arterial duplex suggests a widely patent left SFA stent with all triphasic waveforms. ABI's and TBI's remain normal with all triphasic waveforms.    Fortunately he does not have DM, he is a former smoker.   I spoke with Dr. Trula Slade re stopping the Plavix for a tooth that needs extraction: he may stop the Plavix 5 days before the dental surgery and resume 2 days afterward.    PLAN:  Continued graduated walking program.  Based on the patient's vascular studies and examination, pt will return to clinic in 1 year for ABI's and left LE arterial Duplex.   I discussed in depth with the patient  the nature of atherosclerosis, and emphasized the importance of maximal medical management including strict control of blood pressure, blood glucose, and lipid levels, obtaining regular exercise, and continued cessation of smoking.  The patient is aware that without maximal medical management the underlying atherosclerotic disease process will progress, limiting the benefit of any interventions.  The patient was given information about PAD including signs, symptoms, treatment, what symptoms should prompt the patient to seek immediate medical care, and risk reduction measures to take.  Clemon Chambers, RN, MSN, FNP-C [facility] Phone: [phone]  Clinic MD: Trula Slade  08/24/2015 11:22 AM

## 2015-08-24 NOTE — Patient Instructions (Signed)
Peripheral Vascular Disease Peripheral vascular disease (PVD) is a disease of the blood vessels that are not part of your heart and brain. A simple term for PVD is poor circulation. In most cases, PVD narrows the blood vessels that carry blood from your heart to the rest of your body. This can result in a decreased supply of blood to your arms, legs, and internal organs, like your stomach or kidneys. However, it most often affects a person's lower legs and feet. There are two types of PVD.  Organic PVD. This is the more common type. It is caused by damage to the structure of blood vessels.  Functional PVD. This is caused by conditions that make blood vessels contract and tighten (spasm). Without treatment, PVD tends to get worse over time. PVD can also lead to acute ischemic limb. This is when an arm or limb suddenly has trouble getting enough blood. This is a medical emergency. CAUSES Each type of PVD has many different causes. The most common cause of PVD is buildup of a fatty material (plaque) inside of your arteries (atherosclerosis). Small amounts of plaque can break off from the walls of the blood vessels and become lodged in a smaller artery. This blocks blood flow and can cause acute ischemic limb. Other common causes of PVD include:  Blood clots that form inside of blood vessels.  Injuries to blood vessels.  Diseases that cause inflammation of blood vessels or cause blood vessel spasms.  Health behaviors and health history that increase your risk of developing PVD. RISK FACTORS  You may have a greater risk of PVD if you:  Have a family history of PVD.  Have certain medical conditions, including:  High cholesterol.  Diabetes.  High blood pressure (hypertension).  Coronary heart disease.  Past problems with blood clots.  Past injury, such as burns or a broken bone. These may have damaged blood vessels in your limbs.  Buerger disease. This is caused by inflamed blood  vessels in your hands and feet.  Some forms of arthritis.  Rare birth defects that affect the arteries in your legs.  Use tobacco.  Do not get enough exercise.  Are obese.  Are age 50 or older. SIGNS AND SYMPTOMS  PVD may cause many different symptoms. Your symptoms depend on what part of your body is not getting enough blood. Some common signs and symptoms include:  Cramps in your lower legs. This may be a symptom of poor leg circulation (claudication).  Pain and weakness in your legs while you are physically active that goes away when you rest (intermittent claudication).  Leg pain when at rest.  Leg numbness, tingling, or weakness.  Coldness in a leg or foot, especially when compared with the other leg.  Skin or hair changes. These can include:  Hair loss.  Shiny skin.  Pale or bluish skin.  Thick toenails.  Inability to get or maintain an erection (erectile dysfunction). People with PVD are more prone to developing ulcers and sores on their toes, feet, or legs. These may take longer than normal to heal. DIAGNOSIS Your health care provider may diagnose PVD from your signs and symptoms. The health care provider will also do a physical exam. You may have tests to find out what is causing your PVD and determine its severity. Tests may include:  Blood pressure recordings from your arms and legs and measurements of the strength of your pulses (pulse volume recordings).  Imaging studies using sound waves to take pictures of   the blood flow through your blood vessels (Doppler ultrasound).  Injecting a dye into your blood vessels before having imaging studies using:  X-rays (angiogram or arteriogram).  Computer-generated X-rays (CT angiogram).  A powerful electromagnetic field and a computer (magnetic resonance angiogram or MRA). TREATMENT Treatment for PVD depends on the cause of your condition and the severity of your symptoms. It also depends on your age. Underlying  causes need to be treated and controlled. These include long-lasting (chronic) conditions, such as diabetes, high cholesterol, and high blood pressure. You may need to first try making lifestyle changes and taking medicines. Surgery may be needed if these do not work. Lifestyle changes may include:  Quitting smoking.  Exercising regularly.  Following a low-fat, low-cholesterol diet. Medicines may include:  Blood thinners to prevent blood clots.  Medicines to improve blood flow.  Medicines to improve your blood cholesterol levels. Surgical procedures may include:  A procedure that uses an inflated balloon to open a blocked artery and improve blood flow (angioplasty).  A procedure to put in a tube (stent) to keep a blocked artery open (stent implant).  Surgery to reroute blood flow around a blocked artery (peripheral bypass surgery).  Surgery to remove dead tissue from an infected wound on the affected limb.  Amputation. This is surgical removal of the affected limb. This may be necessary in cases of acute ischemic limb that are not improved through medical or surgical treatments. HOME CARE INSTRUCTIONS  Take medicines only as directed by your health care provider.  Do not use any tobacco products, including cigarettes, chewing tobacco, or electronic cigarettes. If you need help quitting, ask your health care provider.  Lose weight if you are overweight, and maintain a healthy weight as directed by your health care provider.  Eat a diet that is low in fat and cholesterol. If you need help, ask your health care provider.  Exercise regularly. Ask your health care provider to suggest some good activities for you.  Use compression stockings or other mechanical devices as directed by your health care provider.  Take good care of your feet.  Wear comfortable shoes that fit well.  Check your feet often for any cuts or sores. SEEK MEDICAL CARE IF:  You have cramps in your legs  while walking.  You have leg pain when you are at rest.  You have coldness in a leg or foot.  Your skin changes.  You have erectile dysfunction.  You have cuts or sores on your feet that are not healing. SEEK IMMEDIATE MEDICAL CARE IF:  Your arm or leg turns cold and blue.  Your arms or legs become red, warm, swollen, painful, or numb.  You have chest pain or trouble breathing.  You suddenly have weakness in your face, arm, or leg.  You become very confused or lose the ability to speak.  You suddenly have a very bad headache or lose your vision.   This information is not intended to replace advice given to you by your health care provider. Make sure you discuss any questions you have with your health care provider.   Document Released: 11/03/2004 Document Revised: 10/17/2014 Document Reviewed: 03/06/2014 Elsevier Interactive Patient Education 2016 [employer]  

## 2015-08-24 NOTE — Addendum Note (Signed)
Addended by: Dorthula Rue L on: 08/24/2015 12:54 PM   Modules accepted: Orders

## 2015-09-11 ENCOUNTER — Other Ambulatory Visit: Payer: Self-pay | Admitting: Family Medicine

## 2015-11-21 ENCOUNTER — Other Ambulatory Visit: Payer: Self-pay | Admitting: Family Medicine

## 2015-12-04 ENCOUNTER — Other Ambulatory Visit: Payer: Self-pay | Admitting: Family Medicine

## 2015-12-17 ENCOUNTER — Ambulatory Visit: Payer: BLUE CROSS/BLUE SHIELD | Admitting: Family Medicine

## 2015-12-18 ENCOUNTER — Ambulatory Visit (INDEPENDENT_AMBULATORY_CARE_PROVIDER_SITE_OTHER): Payer: BLUE CROSS/BLUE SHIELD | Admitting: Family Medicine

## 2015-12-18 ENCOUNTER — Encounter: Payer: Self-pay | Admitting: Family Medicine

## 2015-12-18 VITALS — BP 134/80 | HR 65 | Temp 97.5°F | Ht 71.0 in | Wt 197.4 lb

## 2015-12-18 DIAGNOSIS — I10 Essential (primary) hypertension: Secondary | ICD-10-CM

## 2015-12-18 DIAGNOSIS — E785 Hyperlipidemia, unspecified: Secondary | ICD-10-CM

## 2015-12-18 DIAGNOSIS — R0989 Other specified symptoms and signs involving the circulatory and respiratory systems: Secondary | ICD-10-CM

## 2015-12-18 DIAGNOSIS — F458 Other somatoform disorders: Secondary | ICD-10-CM | POA: Diagnosis not present

## 2015-12-18 DIAGNOSIS — Z1159 Encounter for screening for other viral diseases: Secondary | ICD-10-CM | POA: Diagnosis not present

## 2015-12-18 MED ORDER — ESOMEPRAZOLE MAGNESIUM 20 MG PO CPDR: 20.0000 mg | DELAYED_RELEASE_CAPSULE | Freq: Every day | ORAL | Status: DC

## 2015-12-18 NOTE — Progress Notes (Signed)
BP 134/80 mmHg  Pulse 65  Temp(Src) 97.5 F (36.4 C) (Oral)  Ht _0  (1.803 m)  Wt 197 lb 6.4 oz (89.54 kg)  BMI 27.54 kg/m2   Subjective:    Patient ID: Tommy [name], male    DOB: 18-Feb-1951, 66 y.o.   MRN: [id number]  HPI: Tommy [name] is a 65 y.o. male presenting on 12/18/2015 for Hyperlipidemia, hypertension followup and Dysphagia   HPI Hyperlipidemia recheck Patient is coming today for a hyperlipidemia recheck. He is currently on fenofibrate and statin. He denies any issues with either he denies any increased myalgias. We will retest his liver function today and his cholesterol as well.  Hypertension recheck Patient is currently almost lisinopril-hydrochlorothiazide. His blood pressure today is 134/80 and is been running about there consistently at home as well Patient denies headaches, blurred vision, chest pains, shortness of breath, or weakness. Denies any side effects from medication and is content with current medication.   Globus sensation and acid reflux Patient has had issues with acid reflux previously and has been on medication previously. He is having sensations now where he feels like food sometimes gets stuck on its way down in his mid esophagus. He denies any issues with swallowing water. This happened about 2 or 3 times a week but not every time he eats. He denies any nausea or vomiting or cough or upper respiratory symptoms.  Relevant past medical, surgical, family and social history reviewed and updated as indicated. Interim medical history since our last visit reviewed. Allergies and medications reviewed and updated.  Review of Systems  Constitutional: Negative for fever and chills.  HENT: Negative for congestion, ear discharge and ear pain.   Eyes: Negative for discharge and visual disturbance.  Respiratory: Positive for choking. Negative for cough, chest tightness, shortness of breath and wheezing.   Cardiovascular: Negative for chest pain, palpitations and  leg swelling.  Gastrointestinal: Negative for abdominal pain, diarrhea and constipation.  Genitourinary: Negative for difficulty urinating.  Musculoskeletal: Negative for back pain and gait problem.  Skin: Negative for rash.  Neurological: Negative for dizziness, syncope, light-headedness and headaches.  All other systems reviewed and are negative.   Per HPI unless specifically indicated above     Medication List       This list is accurate as of: 12/18/15  1:33 PM.  Always use your most recent med list.               clopidogrel 75 MG tablet  Commonly known as:  PLAVIX  TAKE 1 TABLET DAILY     esomeprazole 20 MG capsule  Commonly known as:  NEXIUM  Take 1 capsule (20 mg total) by mouth daily at 12 noon.     fenofibrate 145 MG tablet  Commonly known as:  TRICOR  Take 1 tablet (145 mg total) by mouth every morning.     lisinopril-hydrochlorothiazide 20-25 MG tablet  Commonly known as:  PRINZIDE,ZESTORETIC  TAKE 1 TABLET IN THE MORNING     rosuvastatin 10 MG tablet  Commonly known as:  CRESTOR  Take 1 tablet (10 mg total) by mouth every evening.     tamsulosin 0.4 MG Caps capsule  Commonly known as:  FLOMAX  Take 1 capsule (0.4 mg total) by mouth daily after supper. TAKE (1) CAPSULE DAILY           Objective:    BP 134/80 mmHg  Pulse 65  Temp(Src) 97.5 F (36.4 C) (Oral)  Ht _1  (  1.803 m)  Wt 197 lb 6.4 oz (89.54 kg)  BMI 27.54 kg/m2  Wt Readings from Last 3 Encounters:  12/18/15 197 lb 6.4 oz (89.54 kg)  08/24/15 196 lb (88.905 kg)  06/19/15 195 lb 3.2 oz (88.542 kg)    Physical Exam  Constitutional: He is oriented to person, place, and time. He appears well-developed and well-nourished. No distress.  HENT:  Right Ear: External ear normal.  Left Ear: External ear normal.  Nose: Nose normal.  Mouth/Throat: Oropharynx is clear and moist. No oropharyngeal exudate.  Eyes: Conjunctivae and EOM are normal. Pupils are equal, round, and reactive to  light. Right eye exhibits no discharge. No scleral icterus.  Neck: Neck supple. No thyromegaly present.  Cardiovascular: Normal rate, regular rhythm, normal heart sounds and intact distal pulses.   No murmur heard. Pulmonary/Chest: Effort normal and breath sounds normal. No respiratory distress. He has no wheezes.  Abdominal: He exhibits no distension. There is no tenderness.  Musculoskeletal: Normal range of motion. He exhibits no edema.  Lymphadenopathy:    He has no cervical adenopathy.  Neurological: He is alert and oriented to person, place, and time. Coordination normal.  Skin: Skin is warm and dry. No rash noted. He is not diaphoretic.  Psychiatric: He has a normal mood and affect. His behavior is normal. Thought content normal.  Vitals reviewed.   Results for orders placed or performed in visit on 06/02/15  CMP14+EGFR  Result Value Ref Range   Glucose 96 65 - 99 mg/dL   BUN 21 8 - 27 mg/dL   Creatinine, Ser 1.14 0.76 - 1.27 mg/dL   GFR calc non Af Amer 67 >59 mL/min/1.73   GFR calc Af Amer 77 >59 mL/min/1.73   BUN/Creatinine Ratio 18 10 - 22   Sodium 144 134 - 144 mmol/L   Potassium 5.1 3.5 - 5.2 mmol/L   Chloride 104 97 - 108 mmol/L   CO2 24 18 - 29 mmol/L   Calcium 10.6 (H) 8.6 - 10.2 mg/dL   Total Protein 6.7 6.0 - 8.5 g/dL   Albumin 4.4 3.6 - 4.8 g/dL   Globulin, Total 2.3 1.5 - 4.5 g/dL   Albumin/Globulin Ratio 1.9 1.1 - 2.5   Bilirubin Total 0.9 0.0 - 1.2 mg/dL   Alkaline Phosphatase 31 (L) 39 - 117 IU/L   AST 30 0 - 40 IU/L   ALT 40 0 - 44 IU/L      Assessment & Plan:   Problem List Items Addressed This Visit      Cardiovascular and Mediastinum   HTN (hypertension) - Primary   Relevant Orders   CMP14+EGFR     Other   Hyperlipidemia LDL goal <130   Relevant Orders   Lipid panel    Other Visit Diagnoses    Need for hepatitis C screening test        Relevant Orders    Hepatitis C antibody    Globus sensation        Relevant Medications     esomeprazole (NEXIUM) 20 MG capsule        Follow up plan: Return in about 6 months (around 06/19/2016), or if symptoms worsen or fail to improve, for f/u htn and HLD.  Counseling provided for all of the vaccine components Orders Placed This Encounter  Procedures  . CMP14+EGFR  . Lipid panel  . Hepatitis C antibody    Caryl Pina, MD [facility] 12/18/2015, 1:33 PM

## 2015-12-19 LAB — LIPID PANEL
CHOL/HDL RATIO: 3.7 ratio (ref 0.0–5.0)
Cholesterol, Total: 123 mg/dL (ref 100–199)
HDL: 33 mg/dL — ABNORMAL LOW (ref >39)
LDL CALC: 59 mg/dL (ref 0–99)
TRIGLYCERIDES: 157 mg/dL — AB (ref 0–149)
VLDL CHOLESTEROL CAL: 31 mg/dL (ref 5–40)

## 2015-12-19 LAB — CMP14+EGFR
A/G RATIO: 2 (ref 1.1–2.5)
ALT: 41 IU/L (ref 0–44)
AST: 35 IU/L (ref 0–40)
Albumin: 4.6 g/dL (ref 3.6–4.8)
Alkaline Phosphatase: 32 IU/L — ABNORMAL LOW (ref 39–117)
BUN / CREAT RATIO: 15 (ref 10–22)
BUN: 18 mg/dL (ref 8–27)
Bilirubin Total: 0.8 mg/dL (ref 0.0–1.2)
CO2: 27 mmol/L (ref 18–29)
Calcium: 10.9 mg/dL — ABNORMAL HIGH (ref 8.6–10.2)
Chloride: 103 mmol/L (ref 96–106)
Creatinine, Ser: 1.22 mg/dL (ref 0.76–1.27)
GFR calc Af Amer: 71 mL/min/{1.73_m2} (ref >59)
GFR, EST NON AFRICAN AMERICAN: 61 mL/min/{1.73_m2} (ref >59)
GLOBULIN, TOTAL: 2.3 g/dL (ref 1.5–4.5)
Glucose: 136 mg/dL — ABNORMAL HIGH (ref 65–99)
Potassium: 5.3 mmol/L — ABNORMAL HIGH (ref 3.5–5.2)
Sodium: 147 mmol/L — ABNORMAL HIGH (ref 134–144)
Total Protein: 6.9 g/dL (ref 6.0–8.5)

## 2015-12-19 LAB — HEPATITIS C ANTIBODY: Hep C Virus Ab: <0.1 s/co ratio (ref 0.0–0.9)

## 2015-12-21 ENCOUNTER — Telehealth: Payer: Self-pay | Admitting: Family Medicine

## 2015-12-21 MED ORDER — AMLODIPINE BESYLATE 5 MG PO TABS: 5.0000 mg | ORAL_TABLET | Freq: Every day | ORAL | Status: DC

## 2015-12-21 MED ORDER — HYDROCHLOROTHIAZIDE 25 MG PO TABS: 25.0000 mg | ORAL_TABLET | Freq: Every day | ORAL | Status: DC

## 2015-12-21 NOTE — Telephone Encounter (Signed)
Potassium and calcium both continued to be mildly elevated, switched his blood pressure medications to amlodipine and hydrochlorothiazide instead of the lisinopril hydrochlorothiazide combination because the lisinopril can push both of those up slightly. Would like him to come back in one month to see if they've improved. His potassium was 5.3 this time. His calcium was 10.9. His triglycerides and sugar were slightly elevated but he was also nonfasting so they were within normal limits according to that. Caryl Pina, MD [facility] 12/21/2015, 1:59 PM

## 2015-12-21 NOTE — Telephone Encounter (Signed)
Patient aware.

## 2015-12-29 ENCOUNTER — Other Ambulatory Visit: Payer: Self-pay | Admitting: Family Medicine

## 2016-01-07 ENCOUNTER — Other Ambulatory Visit: Payer: Self-pay | Admitting: Family Medicine

## 2016-01-12 ENCOUNTER — Encounter: Payer: Self-pay | Admitting: Family Medicine

## 2016-01-12 ENCOUNTER — Encounter: Payer: Self-pay | Admitting: *Deleted

## 2016-01-12 ENCOUNTER — Encounter (INDEPENDENT_AMBULATORY_CARE_PROVIDER_SITE_OTHER): Payer: Self-pay

## 2016-01-12 ENCOUNTER — Ambulatory Visit (INDEPENDENT_AMBULATORY_CARE_PROVIDER_SITE_OTHER): Payer: BLUE CROSS/BLUE SHIELD | Admitting: Family Medicine

## 2016-01-12 VITALS — BP 130/81 | HR 65 | Temp 97.9°F | Ht 71.0 in | Wt 195.8 lb

## 2016-01-12 DIAGNOSIS — M7989 Other specified soft tissue disorders: Secondary | ICD-10-CM

## 2016-01-12 NOTE — Progress Notes (Signed)
BP 130/81 mmHg  Pulse 65  Temp(Src) 97.9 F (36.6 C) (Oral)  Ht 5' 11" (1.803 m)  Wt 195 lb 12.8 oz (88.814 kg)  BMI 27.32 kg/m2  SpO2 97%   Subjective:    Patient ID: Tommy [name], male    DOB: 1951/03/25, 65 y.o.   MRN: [id number]  HPI: Tommy [name] is a 65 y.o. male presenting on 01/12/2016 for Edema in ankles   HPI Leg swelling Patient comes in today because he is having increased leg swelling from normal. He wants to make sure that there is nothing serious going on. His swelling is seen in both legs. It is reduced right now because overnight it goes down but during the day when he is on his feet it increases. He denies any fevers or chills or redness or warmth in either leg. The swelling in both legs is very similar. He does have some pruritus because of the swelling. He denies any skin breakdown or drainage.  Relevant past medical, surgical, family and social history reviewed and updated as indicated. Interim medical history since our last visit reviewed. Allergies and medications reviewed and updated.  Review of Systems  Constitutional: Negative for fever and chills.  HENT: Negative for congestion, ear discharge and ear pain.   Eyes: Negative for discharge and visual disturbance.  Respiratory: Negative for shortness of breath and wheezing.   Cardiovascular: Positive for leg swelling. Negative for chest pain and palpitations.  Gastrointestinal: Negative for abdominal pain, diarrhea and constipation.  Genitourinary: Negative for difficulty urinating.  Musculoskeletal: Negative for back pain and gait problem.  Skin: Negative for rash.  Neurological: Negative for dizziness, syncope, light-headedness and headaches.  All other systems reviewed and are negative.   Per HPI unless specifically indicated above     Medication List       This list is accurate as of: 01/12/16 12:14 PM.  Always use your most recent med list.               amLODipine 5 MG tablet  Commonly  known as:  NORVASC  Take 1 tablet (5 mg total) by mouth daily.     clopidogrel 75 MG tablet  Commonly known as:  PLAVIX  TAKE 1 TABLET DAILY     esomeprazole 20 MG capsule  Commonly known as:  NEXIUM  Take 1 capsule (20 mg total) by mouth daily at 12 noon.     fenofibrate 145 MG tablet  Commonly known as:  TRICOR  Take 1 tablet (145 mg total) by mouth every morning.     hydrochlorothiazide 25 MG tablet  Commonly known as:  HYDRODIURIL  Take 1 tablet (25 mg total) by mouth daily.     rosuvastatin 10 MG tablet  Commonly known as:  CRESTOR  Take 1 tablet (10 mg total) by mouth every evening.     tamsulosin 0.4 MG Caps capsule  Commonly known as:  FLOMAX  Take 1 capsule (0.4 mg total) by mouth daily after supper. TAKE (1) CAPSULE DAILY           Objective:    BP 130/81 mmHg  Pulse 65  Temp(Src) 97.9 F (36.6 C) (Oral)  Ht 5' 11" (1.803 m)  Wt 195 lb 12.8 oz (88.814 kg)  BMI 27.32 kg/m2  SpO2 97%  Wt Readings from Last 3 Encounters:  01/12/16 195 lb 12.8 oz (88.814 kg)  12/18/15 197 lb 6.4 oz (89.54 kg)  08/24/15 196 lb (88.905 kg)  Physical Exam  Constitutional: He is oriented to person, place, and time. He appears well-developed and well-nourished. No distress.  Eyes: Conjunctivae and EOM are normal. Pupils are equal, round, and reactive to light. Right eye exhibits no discharge. No scleral icterus.  Neck: Neck supple. No thyromegaly present.  Cardiovascular: Normal rate, regular rhythm, normal heart sounds and intact distal pulses.   No murmur heard. Pulmonary/Chest: Effort normal and breath sounds normal. No respiratory distress. He has no wheezes.  Abdominal: He exhibits no distension. There is no tenderness. There is no rebound.  Musculoskeletal: Normal range of motion. He exhibits edema (Trace in bilateral lower extremities). He exhibits no tenderness.  Lymphadenopathy:    He has no cervical adenopathy.  Neurological: He is alert and oriented to person,  place, and time. No cranial nerve deficit. Coordination normal.  Skin: Skin is warm and dry. No rash noted. He is not diaphoretic.  Psychiatric: He has a normal mood and affect. His behavior is normal.  Vitals reviewed.   Results for orders placed or performed in visit on 12/18/15  CMP14+EGFR  Result Value Ref Range   Glucose 136 (H) 65 - 99 mg/dL   BUN 18 8 - 27 mg/dL   Creatinine, Ser 1.22 0.76 - 1.27 mg/dL   GFR calc non Af Amer 61 >59 mL/min/1.73   GFR calc Af Amer 71 >59 mL/min/1.73   BUN/Creatinine Ratio 15 10 - 22   Sodium 147 (H) 134 - 144 mmol/L   Potassium 5.3 (H) 3.5 - 5.2 mmol/L   Chloride 103 96 - 106 mmol/L   CO2 27 18 - 29 mmol/L   Calcium 10.9 (H) 8.6 - 10.2 mg/dL   Total Protein 6.9 6.0 - 8.5 g/dL   Albumin 4.6 3.6 - 4.8 g/dL   Globulin, Total 2.3 1.5 - 4.5 g/dL   Albumin/Globulin Ratio 2.0 1.1 - 2.5   Bilirubin Total 0.8 0.0 - 1.2 mg/dL   Alkaline Phosphatase 32 (L) 39 - 117 IU/L   AST 35 0 - 40 IU/L   ALT 41 0 - 44 IU/L  Lipid panel  Result Value Ref Range   Cholesterol, Total 123 100 - 199 mg/dL   Triglycerides 157 (H) 0 - 149 mg/dL   HDL 33 (L) >39 mg/dL   VLDL Cholesterol Cal 31 5 - 40 mg/dL   LDL Calculated 59 0 - 99 mg/dL   Chol/HDL Ratio 3.7 0.0 - 5.0 ratio units  Hepatitis C antibody  Result Value Ref Range   Hep C Virus Ab <0.1 0.0 - 0.9 s/co ratio      Assessment & Plan:       Problem List Items Addressed This Visit    None    Visit Diagnoses    Leg swelling    -  Primary    Bilateral leg swelling, able to reduce with compression stockings. Liver and kidney stable, likely venous insufficiency        Follow up plan: Return if symptoms worsen or fail to improve.  Counseling provided for all of the vaccine components No orders of the defined types were placed in this encounter.    Joshua [name], MD [facility] 01/12/2016, 12:14 PM      

## 2016-02-09 NOTE — Telephone Encounter (Signed)
Insurance said that generic Crestor needed no prior authorizaton

## 2016-03-25 ENCOUNTER — Other Ambulatory Visit: Payer: Self-pay | Admitting: Family Medicine

## 2016-05-31 ENCOUNTER — Other Ambulatory Visit: Payer: Self-pay | Admitting: Family Medicine

## 2016-05-31 DIAGNOSIS — N400 Enlarged prostate without lower urinary tract symptoms: Secondary | ICD-10-CM

## 2016-06-28 ENCOUNTER — Encounter: Payer: Self-pay | Admitting: Family Medicine

## 2016-06-28 ENCOUNTER — Ambulatory Visit (INDEPENDENT_AMBULATORY_CARE_PROVIDER_SITE_OTHER): Payer: BLUE CROSS/BLUE SHIELD | Admitting: Family Medicine

## 2016-06-28 VITALS — BP 131/78 | HR 61 | Temp 96.7°F | Ht 71.0 in | Wt 195.6 lb

## 2016-06-28 DIAGNOSIS — N410 Acute prostatitis: Secondary | ICD-10-CM

## 2016-06-28 LAB — URINALYSIS, COMPLETE
Bilirubin, UA: NEGATIVE
GLUCOSE, UA: NEGATIVE
KETONES UA: NEGATIVE
Leukocytes, UA: NEGATIVE
NITRITE UA: NEGATIVE
RBC, UA: NEGATIVE
SPEC GRAV UA: 1.025 (ref 1.005–1.030)
UUROB: 2 mg/dL — AB (ref 0.2–1.0)
pH, UA: 5.5 (ref 5.0–7.5)

## 2016-06-28 LAB — MICROSCOPIC EXAMINATION
Bacteria, UA: NONE SEEN
Epithelial Cells (non renal): NONE SEEN /hpf (ref 0–10)
RBC MICROSCOPIC, UA: NONE SEEN /HPF (ref 0–?)

## 2016-06-28 MED ORDER — SULFAMETHOXAZOLE-TRIMETHOPRIM 800-160 MG PO TABS: 1.0000 | ORAL_TABLET | Freq: Two times a day (BID) | ORAL | 0 refills | Status: DC

## 2016-06-28 NOTE — Patient Instructions (Signed)
Great to meet you!  Take all of the antibiotics, please come bcak if you are getting worse or not getting better as expected.

## 2016-06-28 NOTE — Progress Notes (Signed)
   HPI  Patient presents today here with concern for prostatitis.  Patient explains that he's had previous episodes of prostatitis tomorrow to this.  He describes 2 days of lower abdominal pain radiating to his right flank and right to mid back. He has no fevers, chills, sweats and is tolerating food and fluids normally.  He complains of dark urine but no dysuria or polyuria.  He has history of prostate cancer with radioactive seed placement.  PMH: Smoking status noted ROS: Per HPI  Objective: BP 131/78   Pulse 61   Temp (!) 96.7 F (35.9 C) (Oral)   Ht 5\' 11"  (1.803 m)   Wt 195 lb 9.6 oz (88.7 kg)   BMI 27.28 kg/m  Gen: NAD, alert, cooperative with exam HEENT: NCAT, EOMI, PERRL CV: RRR, good S1/S2, no murmur Resp: CTABL, no wheezes, non-labored Abd: Soft, tenderness to palpation bilateral lower quadrants and suprapubic area, some voluntary guarding over the suprapubic area Ext: No edema, warm Neuro: Alert and oriented, No gross deficits  DRE Normal rectal tone, prostate is tender especially on the left portion Normal prostate texture  Assessment and plan:  # Acute prostatitis Apparent clinical prostatitis Urine is pending He has no systemic signs that his prostate exam and lower abdominal exam are certainly concerning for acute prostatitis Treat with Bactrim Low threshold for return    Orders Placed This Encounter  Procedures  . Urine culture  . Urinalysis, Complete    Meds ordered this encounter  Medications  . sulfamethoxazole-trimethoprim (BACTRIM DS) 800-160 MG tablet    Sig: Take 1 tablet by mouth 2 (two) times daily.    Dispense:  42 tablet    Refill:  0    Laroy Apple, MD [facility] 06/28/2016, 8:14 AM

## 2016-06-29 LAB — URINE CULTURE

## 2016-07-01 ENCOUNTER — Other Ambulatory Visit: Payer: Self-pay | Admitting: Family Medicine

## 2016-07-01 DIAGNOSIS — E785 Hyperlipidemia, unspecified: Secondary | ICD-10-CM

## 2016-07-07 ENCOUNTER — Encounter: Payer: Self-pay | Admitting: Family Medicine

## 2016-07-07 ENCOUNTER — Ambulatory Visit (INDEPENDENT_AMBULATORY_CARE_PROVIDER_SITE_OTHER): Payer: BLUE CROSS/BLUE SHIELD | Admitting: Family Medicine

## 2016-07-07 DIAGNOSIS — Z23 Encounter for immunization: Secondary | ICD-10-CM

## 2016-07-07 DIAGNOSIS — I10 Essential (primary) hypertension: Secondary | ICD-10-CM

## 2016-07-07 DIAGNOSIS — Z0000 Encounter for general adult medical examination without abnormal findings: Secondary | ICD-10-CM | POA: Diagnosis not present

## 2016-07-07 DIAGNOSIS — E785 Hyperlipidemia, unspecified: Secondary | ICD-10-CM

## 2016-07-07 DIAGNOSIS — I739 Peripheral vascular disease, unspecified: Secondary | ICD-10-CM

## 2016-07-07 MED ORDER — TAMSULOSIN HCL 0.4 MG PO CAPS: ORAL_CAPSULE | ORAL | 2 refills | Status: DC

## 2016-07-07 MED ORDER — HYDROCHLOROTHIAZIDE 25 MG PO TABS: ORAL_TABLET | ORAL | 2 refills | Status: DC

## 2016-07-07 MED ORDER — ROSUVASTATIN CALCIUM 10 MG PO TABS: 10.0000 mg | ORAL_TABLET | Freq: Every evening | ORAL | 2 refills | Status: DC

## 2016-07-07 MED ORDER — AMLODIPINE BESYLATE 5 MG PO TABS: ORAL_TABLET | ORAL | 2 refills | Status: DC

## 2016-07-07 MED ORDER — FENOFIBRATE 145 MG PO TABS: ORAL_TABLET | ORAL | 2 refills | Status: DC

## 2016-07-07 MED ORDER — CLOPIDOGREL BISULFATE 75 MG PO TABS: 75.0000 mg | ORAL_TABLET | Freq: Every day | ORAL | 2 refills | Status: DC

## 2016-07-07 NOTE — Progress Notes (Signed)
BP 124/86   Pulse 71   Temp 97.4 F (36.3 C) (Oral)   Ht '5\' 11"'$  (1.803 m)   Wt 194 lb 6 oz (88.2 kg)   BMI 27.11 kg/m    Subjective:    Patient ID: Tommy [name], male    DOB: 1950-10-11, 65 y.o.   MRN: [id number]  HPI: Tommy [name] is a 65 y.o. male presenting on 07/07/2016 for Medication refills and Labwork (patient is not fasting)   HPI Well adult exam and work physical Patient is coming in today for well adult exam and work physical and refill on his medications. He is also here to recheck his cholesterol and hypertension and get refills on his medications for his coronary artery disease and BPH. He says that all of his medications are working well for him and he denies any major side effects. His blood pressure today is 124/86. He denies any chest pain, shortness of breath, headaches or vision issues, abdominal complaints, diarrhea, nausea, vomiting, or joint issues. He was diagnosed with prostatitis week ago and started on antibiotics but has been doing much better symptomatically.  Relevant past medical, surgical, family and social history reviewed and updated as indicated. Interim medical history since our last visit reviewed. Allergies and medications reviewed and updated.  Review of Systems  Constitutional: Negative for chills and fever.  HENT: Negative for ear pain and tinnitus.   Eyes: Negative for pain and discharge.  Respiratory: Negative for cough, shortness of breath and wheezing.   Cardiovascular: Negative for chest pain, palpitations and leg swelling.  Gastrointestinal: Negative for abdominal pain, blood in stool, constipation and diarrhea.  Genitourinary: Negative for dysuria and hematuria.  Musculoskeletal: Negative for back pain, gait problem and myalgias.  Skin: Negative for rash.  Neurological: Negative for dizziness, weakness and headaches.  Psychiatric/Behavioral: Negative for suicidal ideas.  All other systems reviewed and are negative.   Per HPI unless  specifically indicated above     Medication List       Accurate as of 07/07/16  1:23 PM. Always use your most recent med list.          amLODipine 5 MG tablet Commonly known as:  NORVASC Take 1 tablet (5 mg total) by mouth daily.   clopidogrel 75 MG tablet Commonly known as:  PLAVIX Take 1 tablet (75 mg total) by mouth daily.   esomeprazole 20 MG capsule Commonly known as:  NEXIUM Take 1 capsule (20 mg total) by mouth daily at 12 noon.   fenofibrate 145 MG tablet Commonly known as:  TRICOR Take 1 tablet (145 mg total) by mouth every morning.   hydrochlorothiazide 25 MG tablet Commonly known as:  HYDRODIURIL Take 1 tablet (25 mg total) by mouth daily.   rosuvastatin 10 MG tablet Commonly known as:  CRESTOR Take 1 tablet (10 mg total) by mouth every evening.   sulfamethoxazole-trimethoprim 800-160 MG tablet Commonly known as:  BACTRIM DS Take 1 tablet by mouth 2 (two) times daily.   tamsulosin 0.4 MG Caps capsule Commonly known as:  FLOMAX Take 1 capsule (0.4 mg total) by mouth daily after supper.          Objective:    BP 124/86   Pulse 71   Temp 97.4 F (36.3 C) (Oral)   Ht '5\' 11"'$  (1.803 m)   Wt 194 lb 6 oz (88.2 kg)   BMI 27.11 kg/m   Wt Readings from Last 3 Encounters:  07/07/16 194 lb 6 oz (88.2  kg)  06/28/16 195 lb 9.6 oz (88.7 kg)  01/12/16 195 lb 12.8 oz (88.8 kg)    Physical Exam  Constitutional: He is oriented to person, place, and time. He appears well-developed and well-nourished. No distress.  HENT:  Right Ear: External ear normal.  Left Ear: External ear normal.  Nose: Nose normal.  Mouth/Throat: Oropharynx is clear and moist. No oropharyngeal exudate.  Eyes: Conjunctivae and EOM are normal. Pupils are equal, round, and reactive to light. Right eye exhibits no discharge. No scleral icterus.  Neck: Neck supple. No thyromegaly present.  Cardiovascular: Normal rate, regular rhythm, normal heart sounds and intact distal pulses.   No  murmur heard. Pulmonary/Chest: Effort normal and breath sounds normal. No respiratory distress. He has no wheezes.  Abdominal: Soft. Bowel sounds are normal. He exhibits no distension. There is no tenderness. There is no rebound and no guarding.  Genitourinary:  Genitourinary Comments: Patient declined exam today  Musculoskeletal: Normal range of motion. He exhibits no edema.  Lymphadenopathy:    He has no cervical adenopathy.  Neurological: He is alert and oriented to person, place, and time. Coordination normal.  Skin: Skin is warm and dry. No rash noted. He is not diaphoretic.  Psychiatric: He has a normal mood and affect. His behavior is normal.  Vitals reviewed.     Assessment & Plan:   Problem List Items Addressed This Visit      Cardiovascular and Mediastinum   PAD (peripheral artery disease) (HCC)   Relevant Medications   rosuvastatin (CRESTOR) 10 MG tablet   amLODipine (NORVASC) 5 MG tablet   hydrochlorothiazide (HYDRODIURIL) 25 MG tablet   fenofibrate (TRICOR) 145 MG tablet   Other Relevant Orders   CBC with Differential/Platelet   HTN (hypertension)   Relevant Medications   rosuvastatin (CRESTOR) 10 MG tablet   amLODipine (NORVASC) 5 MG tablet   hydrochlorothiazide (HYDRODIURIL) 25 MG tablet   fenofibrate (TRICOR) 145 MG tablet   Other Relevant Orders   CMP14+EGFR     Other   Hyperlipidemia LDL goal <130   Relevant Medications   rosuvastatin (CRESTOR) 10 MG tablet   amLODipine (NORVASC) 5 MG tablet   hydrochlorothiazide (HYDRODIURIL) 25 MG tablet   fenofibrate (TRICOR) 145 MG tablet   Other Relevant Orders   Lipid panel    Other Visit Diagnoses   None.      Follow up plan: Return in about 6 months (around 01/04/2017), or if symptoms worsen or fail to improve, for Recheck cholesterol and blood pressure.  Counseling provided for all of the vaccine components Orders Placed This Encounter  Procedures  . Flu Vaccine QUAD 36+ mos IM  . CBC with  Differential/Platelet  . CMP14+EGFR  . Lipid panel    Caryl Pina, MD [facility] 07/07/2016, 1:23 PM

## 2016-07-07 NOTE — Addendum Note (Signed)
Addended by: Michaela Corner on: 07/07/2016 01:43 PM   Modules accepted: Orders

## 2016-07-11 ENCOUNTER — Other Ambulatory Visit (INDEPENDENT_AMBULATORY_CARE_PROVIDER_SITE_OTHER): Payer: BLUE CROSS/BLUE SHIELD

## 2016-07-11 DIAGNOSIS — I739 Peripheral vascular disease, unspecified: Secondary | ICD-10-CM

## 2016-07-11 DIAGNOSIS — I10 Essential (primary) hypertension: Secondary | ICD-10-CM

## 2016-07-11 DIAGNOSIS — E785 Hyperlipidemia, unspecified: Secondary | ICD-10-CM

## 2016-07-12 LAB — CMP14+EGFR
A/G RATIO: 2 (ref 1.2–2.2)
ALK PHOS: 31 IU/L — AB (ref 39–117)
ALT: 26 IU/L (ref 0–44)
AST: 25 IU/L (ref 0–40)
Albumin: 4.6 g/dL (ref 3.6–4.8)
BUN/Creatinine Ratio: 16 (ref 10–24)
BUN: 20 mg/dL (ref 8–27)
Bilirubin Total: 0.6 mg/dL (ref 0.0–1.2)
CALCIUM: 10.3 mg/dL — AB (ref 8.6–10.2)
CO2: 22 mmol/L (ref 18–29)
CREATININE: 1.29 mg/dL — AB (ref 0.76–1.27)
Chloride: 102 mmol/L (ref 96–106)
GFR calc Af Amer: 66 mL/min/{1.73_m2} (ref >59)
GFR, EST NON AFRICAN AMERICAN: 57 mL/min/{1.73_m2} — AB (ref >59)
Globulin, Total: 2.3 g/dL (ref 1.5–4.5)
Glucose: 85 mg/dL (ref 65–99)
POTASSIUM: 4.8 mmol/L (ref 3.5–5.2)
Sodium: 143 mmol/L (ref 134–144)
Total Protein: 6.9 g/dL (ref 6.0–8.5)

## 2016-07-12 LAB — CBC WITH DIFFERENTIAL/PLATELET
Basophils Absolute: 0 x10E3/uL (ref 0.0–0.2)
Basos: 1 %
EOS (ABSOLUTE): 0.1 x10E3/uL (ref 0.0–0.4)
Eos: 3 %
Hematocrit: 39.7 % (ref 37.5–51.0)
Hemoglobin: 14.1 g/dL (ref 12.6–17.7)
Immature Grans (Abs): 0 x10E3/uL (ref 0.0–0.1)
Immature Granulocytes: 1 %
Lymphocytes Absolute: 1.5 x10E3/uL (ref 0.7–3.1)
Lymphs: 37 %
MCH: 29.9 pg (ref 26.6–33.0)
MCHC: 35.5 g/dL (ref 31.5–35.7)
MCV: 84 fL (ref 79–97)
Monocytes Absolute: 0.4 x10E3/uL (ref 0.1–0.9)
Monocytes: 9 %
Neutrophils Absolute: 2 x10E3/uL (ref 1.4–7.0)
Neutrophils: 49 %
Platelets: 193 x10E3/uL (ref 150–379)
RBC: 4.72 x10E6/uL (ref 4.14–5.80)
RDW: 13.4 % (ref 12.3–15.4)
WBC: 4.1 x10E3/uL (ref 3.4–10.8)

## 2016-07-12 LAB — LIPID PANEL
CHOL/HDL RATIO: 3.6 ratio (ref 0.0–5.0)
CHOLESTEROL TOTAL: 126 mg/dL (ref 100–199)
HDL: 35 mg/dL — AB (ref >39)
LDL Calculated: 70 mg/dL (ref 0–99)
TRIGLYCERIDES: 103 mg/dL (ref 0–149)
VLDL Cholesterol Cal: 21 mg/dL (ref 5–40)

## 2016-08-04 ENCOUNTER — Emergency Department (HOSPITAL_COMMUNITY)
Admission: EM | Admit: 2016-08-04 | Discharge: 2016-08-04 | Disposition: A | Discharge status: Home / Self Care | Payer: BLUE CROSS/BLUE SHIELD | Attending: Emergency Medicine | Admitting: Emergency Medicine

## 2016-08-04 ENCOUNTER — Emergency Department (HOSPITAL_COMMUNITY): Payer: BLUE CROSS/BLUE SHIELD

## 2016-08-04 ENCOUNTER — Encounter (HOSPITAL_COMMUNITY): Payer: Self-pay | Admitting: Emergency Medicine

## 2016-08-04 DIAGNOSIS — Z79899 Other long term (current) drug therapy: Secondary | ICD-10-CM | POA: Diagnosis not present

## 2016-08-04 DIAGNOSIS — I10 Essential (primary) hypertension: Secondary | ICD-10-CM | POA: Insufficient documentation

## 2016-08-04 DIAGNOSIS — Z87891 Personal history of nicotine dependence: Secondary | ICD-10-CM | POA: Diagnosis not present

## 2016-08-04 DIAGNOSIS — S0990XA Unspecified injury of head, initial encounter: Secondary | ICD-10-CM

## 2016-08-04 DIAGNOSIS — Z8546 Personal history of malignant neoplasm of prostate: Secondary | ICD-10-CM | POA: Insufficient documentation

## 2016-08-04 DIAGNOSIS — W1800XA Striking against unspecified object with subsequent fall, initial encounter: Secondary | ICD-10-CM | POA: Diagnosis not present

## 2016-08-04 DIAGNOSIS — Z23 Encounter for immunization: Secondary | ICD-10-CM | POA: Insufficient documentation

## 2016-08-04 DIAGNOSIS — Y939 Activity, unspecified: Secondary | ICD-10-CM | POA: Insufficient documentation

## 2016-08-04 DIAGNOSIS — Y999 Unspecified external cause status: Secondary | ICD-10-CM | POA: Diagnosis not present

## 2016-08-04 DIAGNOSIS — S0181XA Laceration without foreign body of other part of head, initial encounter: Secondary | ICD-10-CM | POA: Diagnosis not present

## 2016-08-04 DIAGNOSIS — Y929 Unspecified place or not applicable: Secondary | ICD-10-CM | POA: Insufficient documentation

## 2016-08-04 LAB — I-STAT CHEM 8, ED
BUN: 25 mg/dL — ABNORMAL HIGH (ref 6–20)
CALCIUM ION: 1.25 mmol/L (ref 1.15–1.40)
Chloride: 104 mmol/L (ref 101–111)
Creatinine, Ser: 1.1 mg/dL (ref 0.61–1.24)
GLUCOSE: 105 mg/dL — AB (ref 65–99)
HCT: 33 % — ABNORMAL LOW (ref 39.0–52.0)
HEMOGLOBIN: 11.2 g/dL — AB (ref 13.0–17.0)
POTASSIUM: 3.6 mmol/L (ref 3.5–5.1)
SODIUM: 144 mmol/L (ref 135–145)
TCO2: 27 mmol/L (ref 0–100)

## 2016-08-04 MED ORDER — TETANUS-DIPHTH-ACELL PERTUSSIS 5-2.5-18.5 LF-MCG/0.5 IM SUSP
0.5000 mL | Freq: Once | INTRAMUSCULAR | Status: AC
  Administered 2016-08-04: 0.5 mL via INTRAMUSCULAR
  Filled 2016-08-04: qty 0.5

## 2016-08-04 MED ORDER — TETANUS-DIPHTH-ACELL PERTUSSIS 5-2.5-18.5 LF-MCG/0.5 IM SUSP
INTRAMUSCULAR | Status: AC
  Administered 2016-08-04: 0.5 mL via INTRAMUSCULAR
  Filled 2016-08-04: qty 0.5

## 2016-08-04 MED ORDER — SODIUM CHLORIDE 0.9 % IV BOLUS (SEPSIS)
500.0000 mL | Freq: Once | INTRAVENOUS | Status: AC
  Administered 2016-08-04: 500 mL via INTRAVENOUS

## 2016-08-04 NOTE — ED Provider Notes (Signed)
[facility] DEPT Provider Note   CSN: [id number] Arrival date & time: 08/04/16  0750     History   Chief Complaint Chief Complaint  Patient presents with  . Fall  . Laceration    HPI WARIS MCHAFFIE is a 65 y.o. male.  Pt fell out of bed and hit his head. Patient does not complain of pain in his forehead or neck   The history is provided by the patient. No language interpreter was used.  Fall  This is a new problem. The current episode started 3 to 5 hours ago. The problem occurs constantly. The problem has not changed since onset.Pertinent negatives include no chest pain, no abdominal pain and no headaches. Nothing aggravates the symptoms. Nothing relieves the symptoms.  Laceration      Past Medical History:  Diagnosis Date  . BPH with obstruction/lower urinary tract symptoms    hyperplasia  . ED (erectile dysfunction)    arterial insuff.  . History of DVT of lower extremity   . History of gastric ulcer   . Hyperlipidemia   . Hypertension   . Peripheral vascular disease ([place])   . Prostate cancer [facility]) DX 01/01/15--  urologist-  dr Jeffie Pollock   Stage T1c, Gleason 3+4, PSA 5.73, vol 44ml  . Wears glasses     Patient Active Problem List   Diagnosis Date Noted  . Hyperkalemia 06/19/2015  . Hyperlipidemia LDL goal <130 05/18/2015  . Elevated creatine kinase 05/18/2015  . Stage T1c adenocarcinoma of the prostate with a Gleason's Score of 3+4 and a PSA of 5.73 02/01/2015  . Peripheral vascular disease, unspecified 06/27/2012  . BPH (benign prostatic hypertrophy) 02/09/2011  . PAD (peripheral artery disease) ([place]) 02/09/2011  . HTN (hypertension) 02/09/2011    Past Surgical History:  Procedure Laterality Date  . FEMORAL ARTERY STENT Left 07-22-2008  &  11-16-2010   left SFA//   In-Stent restenosis  . ORIF LEFT INDEX FINGER FX  09-28-2006  . PROSTATE BIOPSY  01/01/15  . PROSTATE SURGERY  July 2016   Seed Implant  . RADIOACTIVE SEED IMPLANT N/A 04/23/2015   Procedure:  RADIOACTIVE SEED IMPLANT/BRACHYTHERAPY IMPLANT;  Surgeon: Irine Seal, MD;  Location: [facility];  Service: Urology;  Laterality: N/A;  . TONSILLECTOMY  as child       Home Medications    Prior to Admission medications   Medication Sig Start Date End Date Taking? Authorizing Provider  amLODipine (NORVASC) 5 MG tablet Take 1 tablet (5 mg total) by mouth daily. 07/07/16  Yes Fransisca Kaufmann [name], MD  clopidogrel (PLAVIX) 75 MG tablet Take 1 tablet (75 mg total) by mouth daily. 07/07/16  Yes Fransisca Kaufmann [name], MD  fenofibrate (TRICOR) 145 MG tablet Take 1 tablet (145 mg total) by mouth every morning. 07/07/16  Yes Fransisca Kaufmann [name], MD  hydrochlorothiazide (HYDRODIURIL) 25 MG tablet Take 1 tablet (25 mg total) by mouth daily. 07/07/16  Yes Fransisca Kaufmann [name], MD  rosuvastatin (CRESTOR) 10 MG tablet Take 1 tablet (10 mg total) by mouth every evening. 07/07/16  Yes Fransisca Kaufmann [name], MD  tamsulosin (FLOMAX) 0.4 MG CAPS capsule Take 1 capsule (0.4 mg total) by mouth daily after supper. 07/07/16  Yes Fransisca Kaufmann [name], MD  esomeprazole (NEXIUM) 20 MG capsule Take 1 capsule (20 mg total) by mouth daily at 12 noon. 12/18/15   Fransisca Kaufmann [name], MD  sulfamethoxazole-trimethoprim (BACTRIM DS) 800-160 MG tablet Take 1 tablet by mouth 2 (two) times daily. 06/28/16   Mikeal Hawthorne  Carmin Muskrat, MD    Family History Family History  Problem Relation Age of Onset  . Heart disease Mother   . Cancer Mother   . Deep vein thrombosis Mother   . Diabetes Mother   . Hyperlipidemia Mother   . Hypertension Mother   . Heart attack Mother   . Peripheral vascular disease Mother   . Cancer Father     Social History Social History  Substance Use Topics  . Smoking status: Former Smoker    Packs/day: 1.00    Years: 15.00    Types: Cigarettes    Start date: 09/09/2010    Quit date: 04/21/2011  . Smokeless tobacco: Former Systems developer    Types: Snuff    Quit date: 04/23/2015     Comment: occasional  someday dips  tobacco  . Alcohol use No     Allergies   Penicillins   Review of Systems Review of Systems  Constitutional: Negative for appetite change and fatigue.  HENT: Negative for congestion, ear discharge and sinus pressure.        Bleeding from laceration to forehead  Eyes: Negative for discharge.  Respiratory: Negative for cough.   Cardiovascular: Negative for chest pain.  Gastrointestinal: Negative for abdominal pain and diarrhea.  Genitourinary: Negative for frequency and hematuria.  Musculoskeletal: Negative for back pain.  Skin: Negative for rash.  Neurological: Negative for seizures and headaches.  Psychiatric/Behavioral: Negative for hallucinations.     Physical Exam Updated Vital Signs BP 175/94   Pulse 62   Temp 98.4 F (36.9 C) (Oral)   Resp 18   Ht 5\' 11"  (1.803 m)   Wt 194 lb (88 kg)   SpO2 97%   BMI 27.06 kg/m   Physical Exam  Constitutional: He is oriented to person, place, and time. He appears well-developed.  HENT:  Head: Normocephalic.  Patient is a 3 cm laceration on his forehead. Laceration is bleeding profusely. Patient is on Plavix  Eyes: Conjunctivae and EOM are normal. No scleral icterus.  Neck: Neck supple. No thyromegaly present.  Cardiovascular: Normal rate and regular rhythm.  Exam reveals no gallop and no friction rub.   No murmur heard. Pulmonary/Chest: No stridor. He has no wheezes. He has no rales. He exhibits no tenderness.  Abdominal: He exhibits no distension. There is no tenderness. There is no rebound.  Musculoskeletal: Normal range of motion. He exhibits no edema.  Lymphadenopathy:    He has no cervical adenopathy.  Neurological: He is oriented to person, place, and time. He exhibits normal muscle tone. Coordination normal.  Skin: No rash noted. No erythema.  Psychiatric: He has a normal mood and affect. His behavior is normal.     ED Treatments / Results  Labs (all labs ordered are listed, but only abnormal results are  displayed) Labs Reviewed  I-STAT CHEM 8, ED - Abnormal; Notable for the following:       Result Value   BUN 25 (*)    Glucose, Bld 105 (*)    Hemoglobin 11.2 (*)    HCT 33.0 (*)    All other components within normal limits    EKG  EKG Interpretation None       Radiology Ct Head Wo Contrast  Result Date: 08/04/2016 CLINICAL DATA:  Fall, forehead injury, laceration to bridge of nose with staples, swelling to bilateral hives EXAM: CT HEAD WITHOUT CONTRAST CT CERVICAL SPINE WITHOUT CONTRAST TECHNIQUE: Multidetector CT imaging of the head and cervical spine was performed following the standard  protocol without intravenous contrast. Multiplanar CT image reconstructions of the cervical spine were also generated. COMPARISON:  None. FINDINGS: CT HEAD FINDINGS Brain: No evidence of acute infarction, hemorrhage, hydrocephalus, extra-axial collection or mass lesion/mass effect. Mild global cortical atrophy.  No ventriculomegaly. Vascular: No hyperdense vessel or unexpected calcification. Skull: Normal. Negative for fracture or focal lesion. Sinuses/Orbits: No acute finding. Other: Soft tissue swelling/laceration with hematoma and overlying skin staples along the nasal bridge (series 2/ image 10). Additional soft tissue swelling overlying the frontal bone (series 2/ image 12), left orbit (series 2/ image 8), and left maxilla (series 2/image 4). CT CERVICAL SPINE FINDINGS Alignment: Normal cervical lordosis. Skull base and vertebrae: No acute fracture. No primary bone lesion or focal pathologic process. Soft tissues and spinal canal: No prevertebral fluid or swelling. No visible canal hematoma. Disc levels:  Very mild degenerative changes at C4-5 and C5-6. Spinal canal is patent. Upper chest: Visualized lung apices are clear. Other: Visualized thyroid is unremarkable. IMPRESSION: Soft tissue swelling/laceration overlying the nasal bridge, with overlying skin staples. Additional soft tissue swelling overlying  the frontal bone, left orbit, and left maxilla. No evidence of calvarial fracture. Mild cortical atrophy. No evidence of acute intracranial abnormality. No evidence of traumatic injury to the cervical spine. Electronically Signed   By: Julian Hy M.D.   On: 08/04/2016 10:53   Ct Cervical Spine Wo Contrast  Result Date: 08/04/2016 CLINICAL DATA:  Fall, forehead injury, laceration to bridge of nose with staples, swelling to bilateral hives EXAM: CT HEAD WITHOUT CONTRAST CT CERVICAL SPINE WITHOUT CONTRAST TECHNIQUE: Multidetector CT imaging of the head and cervical spine was performed following the standard protocol without intravenous contrast. Multiplanar CT image reconstructions of the cervical spine were also generated. COMPARISON:  None. FINDINGS: CT HEAD FINDINGS Brain: No evidence of acute infarction, hemorrhage, hydrocephalus, extra-axial collection or mass lesion/mass effect. Mild global cortical atrophy.  No ventriculomegaly. Vascular: No hyperdense vessel or unexpected calcification. Skull: Normal. Negative for fracture or focal lesion. Sinuses/Orbits: No acute finding. Other: Soft tissue swelling/laceration with hematoma and overlying skin staples along the nasal bridge (series 2/ image 10). Additional soft tissue swelling overlying the frontal bone (series 2/ image 12), left orbit (series 2/ image 8), and left maxilla (series 2/image 4). CT CERVICAL SPINE FINDINGS Alignment: Normal cervical lordosis. Skull base and vertebrae: No acute fracture. No primary bone lesion or focal pathologic process. Soft tissues and spinal canal: No prevertebral fluid or swelling. No visible canal hematoma. Disc levels:  Very mild degenerative changes at C4-5 and C5-6. Spinal canal is patent. Upper chest: Visualized lung apices are clear. Other: Visualized thyroid is unremarkable. IMPRESSION: Soft tissue swelling/laceration overlying the nasal bridge, with overlying skin staples. Additional soft tissue swelling  overlying the frontal bone, left orbit, and left maxilla. No evidence of calvarial fracture. Mild cortical atrophy. No evidence of acute intracranial abnormality. No evidence of traumatic injury to the cervical spine. Electronically Signed   By: Julian Hy M.D.   On: 08/04/2016 10:53    Procedures .Marland KitchenLaceration Repair Date/Time: 08/04/2016 12:05 PM Performed by: Milton Ferguson Authorized by: Milton Ferguson   Comments:     Patient with recently a laceration to the middle part of his forehead. Area bleeding profusely. Area was cleaned thoroughly with Betadine and staples were used to close laceration. Patient had 12 staples applied. Patient tolerated the procedure   (including critical care time)  Medications Ordered in ED Medications  sodium chloride 0.9 % bolus 500 mL (0 mLs Intravenous  Stopped 08/04/16 0943)  Tdap (BOOSTRIX) injection 0.5 mL (0.5 mLs Intramuscular Given 08/04/16 1143)     Initial Impression / Assessment and Plan / ED Course  I have reviewed the triage vital signs and the nursing notes.  Pertinent labs & imaging results that were available during my care of the patient were reviewed by me and considered in my medical decision making (see chart for details).  Clinical Course    Head injury with laceration to forehead  Final Clinical Impressions(s) / ED Diagnoses   Final diagnoses:  Injury of head, initial encounter    New Prescriptions New Prescriptions   No medications on file     Milton Ferguson, MD 08/04/16 1205

## 2016-08-04 NOTE — ED Notes (Signed)
Pt given blue scrubs to wear home. Pt states, pt significant other has pt's money clip and gold necklace.

## 2016-08-04 NOTE — ED Notes (Signed)
Initial I-stat result did not cross into pt chart. Assessed I-stat result, Angap resulted "bad cartridge". New cartridge obtained and sample being re-run at this time.

## 2016-08-04 NOTE — ED Notes (Addendum)
Pt denies dizziness with standing. nad noted at time of orthostatic vitals being obtained.  Pt requesting to ambulate to bathroom. Pt ambulated to bathroom with steady gait.   Moderate swelling remains to middle of forehead and left eye. Ice Pack intermittently applied by patient.

## 2016-08-04 NOTE — ED Triage Notes (Signed)
Pt reports falling out of bed and possibly hitting nightstand.  Laceration between eyes.  Pt states he is on plavix and is covered in blood.Marland Kitchen

## 2016-08-04 NOTE — Discharge Instructions (Signed)
Clean the area gently with soap and water twice a day. Follow-up with your family doctor in one week to have sutures out. Oral return back to the emergency department

## 2016-08-12 ENCOUNTER — Encounter: Payer: Self-pay | Admitting: Family

## 2016-08-12 ENCOUNTER — Ambulatory Visit (INDEPENDENT_AMBULATORY_CARE_PROVIDER_SITE_OTHER): Payer: BLUE CROSS/BLUE SHIELD | Admitting: Family

## 2016-08-12 VITALS — BP 135/86 | HR 72 | Temp 97.9°F | Ht 71.0 in | Wt 194.2 lb

## 2016-08-12 DIAGNOSIS — Z4802 Encounter for removal of sutures: Secondary | ICD-10-CM | POA: Diagnosis not present

## 2016-08-12 DIAGNOSIS — Z5189 Encounter for other specified aftercare: Secondary | ICD-10-CM | POA: Diagnosis not present

## 2016-08-12 NOTE — Progress Notes (Signed)
   Subjective:    Patient ID: Tommy [name], male    DOB: Mar 01, 1951, 65 y.o.   MRN: [id number]  HPI PT presents to the office today for staple removal on his forehead. Pt fell out of bed and then had 12 staples placed. PT states he is doing well, but hast an intermittent headache of 6 out 10. Pt had a negative CT of head. PT denies any dizziness or light headedness .    Review of Systems  Skin: Positive for wound.  Neurological: Positive for headaches. Negative for light-headedness and numbness.       Objective:   Physical Exam  Constitutional: He is oriented to person, place, and time. He appears well-developed and well-nourished.  Cardiovascular: Normal rate, regular rhythm, normal heart sounds and intact distal pulses.   Pulmonary/Chest: Effort normal and breath sounds normal.  Neurological: He is alert and oriented to person, place, and time.  Skin: Skin is warm and dry.  13 stables removed, no erythemas, drainage, or warm     BP 135/86   Pulse 72   Temp 97.9 F (36.6 C) (Oral)   Ht 5\' 11"  (1.803 m)   Wt 194 lb 3.2 oz (88.1 kg)   BMI 27.09 kg/m       Assessment & Plan:  1. Removal of staple  2. Visit for wound check  Keep clean and dry Report any s/s of infection- Increased redness, warmth, or discharge RTO prn  Evelina Dun, FNP

## 2016-08-12 NOTE — Patient Instructions (Signed)
Wound Closure Removal °The staples, stitches, or skin adhesives that were used to close your skin have been removed. You will need to continue the care described here until the wound is completely healed and your health care provider confirms that wound care can be stopped. °HOW DO I CARE FOR MY WOUND? °How you care for your wound after the wound closure has been removed depends on the kind of wound closure you had. °Stitches or Staples °· Keep the wound site dry and clean. Do not soak it in water. °· If skin adhesive strips were applied after the staples were removed, they will begin to peel off in a few days. Allow them to remain in place until they fall off on their own. °· If you still have a bandage (dressing), change it at least once a day or as directed by your health care provider. If the dressing sticks, pour warm, sterile water over it until it loosens and can be removed without pulling apart the wound edges. Pat the area dry with a soft, clean towel. Do not rub the wound because that may cause bleeding. °· Apply cream or ointment that stops the growth of bacteria (antibacterial cream or antibacterial ointment) only if your health care provider has directed you to do so. °· Place a nonstick bandage over the wound to prevent the dressing from sticking. °· Cover the nonstick bandage with a new dressing as directed by your health care provider. °· If the bandage becomes wet or dirty or it develops a bad smell, change it as soon as possible. °· Take medicines only as directed by your health care provider. °Adhesive Strips or Glue °· Adhesive strips and glue peel off on their own. °· Leave adhesive strips and glue in place until they fall off. °ARE THERE ANY BATHING RESTRICTIONS ONCE MY WOUND CLOSURE IS REMOVED? °Do not take baths, swim, or use a hot tub until your health care provider approves. °HOW CAN I DECREASE THE SIZE OF MY SCAR? °How your scar heals and the size of your scar depend on many factors, such  as your age, the type of scar you have, and genetic factors. The following may help decrease the size of your scar: °· Sunscreen. Use sunscreen with a sun protection factor (SPF) of at least 15 when out in the sun. Reapply the sunscreen every two hours. °· Friction massage. Once your wound is completely healed, you can gently massage the scarred area. This can decrease scar thickness. °WHEN SHOULD I SEEK HELP?  °Seek help if: °· You have a fever. °· You have chills. °· You have drainage, redness, swelling, or pain at your wound. °· There is a bad smell coming from your wound. °· Your wound edges open up or do not stay closed after the wound closure has been removed. °  °This information is not intended to replace advice given to you by your health care provider. Make sure you discuss any questions you have with your health care provider. °  °Document Released: 09/08/2008 Document Revised: 10/17/2014 Document Reviewed: 02/11/2014 °Elsevier Interactive Patient Education ©2016 [employer] ° °

## 2016-08-29 ENCOUNTER — Encounter (HOSPITAL_COMMUNITY): Payer: BLUE CROSS/BLUE SHIELD

## 2016-08-29 ENCOUNTER — Ambulatory Visit: Payer: BLUE CROSS/BLUE SHIELD | Admitting: Family

## 2016-09-14 ENCOUNTER — Ambulatory Visit (INDEPENDENT_AMBULATORY_CARE_PROVIDER_SITE_OTHER): Payer: BLUE CROSS/BLUE SHIELD | Admitting: Family Medicine

## 2016-09-14 ENCOUNTER — Encounter: Payer: Self-pay | Admitting: Family Medicine

## 2016-09-14 VITALS — BP 163/93 | HR 64 | Temp 97.0°F | Ht 71.0 in | Wt 197.2 lb

## 2016-09-14 DIAGNOSIS — S76301A Unspecified injury of muscle, fascia and tendon of the posterior muscle group at thigh level, right thigh, initial encounter: Secondary | ICD-10-CM

## 2016-09-14 DIAGNOSIS — S76309A Unspecified injury of muscle, fascia and tendon of the posterior muscle group at thigh level, unspecified thigh, initial encounter: Secondary | ICD-10-CM

## 2016-09-14 MED ORDER — TIZANIDINE HCL 2 MG PO CAPS: 2.0000 mg | ORAL_CAPSULE | Freq: Three times a day (TID) | ORAL | 0 refills | Status: DC

## 2016-09-14 NOTE — Progress Notes (Signed)
BP (!) 163/93   Pulse 64   Temp 97 F (36.1 C) (Oral)   Ht 5\' 11"  (1.803 m)   Wt 197 lb 4 oz (89.5 kg)   BMI 27.51 kg/m    Subjective:    Patient ID: Tommy [name], male    DOB: [birth year], 66 y.o.   MRN: [id number]  HPI: Tommy [name] is a 65 y.o. male presenting on 09/14/2016 for Leg Pain (right leg pain, began last week, he was walking in [place] and felt something pop in his back last week, pain began the next day; patient concerned because of history of DVT)   HPI Leg pain Patient was walking in [place] about 1 week ago and felt something pop in the back of his right buttocks and then has been having pain going down the back of his leg since that time. He denies any difficulties ambulating or significant pain with walking but has more pain when he lays in certain ways at nighttime. The pain goes down the back of his right leg. He denies any fevers or chills or redness or warmth or leg swelling. He has been taking Tylenol and Advil to help with that. He says it has slightly improved over the past week but he still wanted to get checked out to make sure it wasn't anything more severe. The pain right now is 2 out of 10 but gets as high as 7 out of 10. Going up and down stairs also makes it worse.  Relevant past medical, surgical, family and social history reviewed and updated as indicated. Interim medical history since our last visit reviewed. Allergies and medications reviewed and updated.  Review of Systems  Constitutional: Negative for chills and fever.  Respiratory: Negative for shortness of breath and wheezing.   Cardiovascular: Negative for chest pain and leg swelling.  Musculoskeletal: Positive for myalgias. Negative for arthralgias, back pain, gait problem and joint swelling.  Skin: Negative for color change and rash.  All other systems reviewed and are negative.   Per HPI unless specifically indicated above      Objective:    BP (!) 163/93   Pulse 64   Temp 97 F (36.1  C) (Oral)   Ht 5\' 11"  (1.803 m)   Wt 197 lb 4 oz (89.5 kg)   BMI 27.51 kg/m   Wt Readings from Last 3 Encounters:  09/14/16 197 lb 4 oz (89.5 kg)  08/12/16 194 lb 3.2 oz (88.1 kg)  08/04/16 194 lb (88 kg)    Physical Exam  Constitutional: He is oriented to person, place, and time. He appears well-developed and well-nourished. No distress.  Eyes: Conjunctivae are normal.  Cardiovascular: Normal rate, regular rhythm, normal heart sounds and intact distal pulses.   No murmur heard. Pulmonary/Chest: Effort normal and breath sounds normal. No respiratory distress. He has no wheezes.  Musculoskeletal: Normal range of motion. He exhibits no edema.       Right hip: Normal.       Right knee: Normal.       Lumbar back: Normal.       Right upper leg: He exhibits tenderness. He exhibits no bony tenderness.       Legs: Neurological: He is alert and oriented to person, place, and time. Coordination normal.  Skin: Skin is warm and dry. No rash noted. He is not diaphoretic.  Psychiatric: He has a normal mood and affect. His behavior is normal.  Nursing note and vitals reviewed.  Assessment & Plan:   Problem List Items Addressed This Visit    None    Visit Diagnoses    Hamstring injury, initial encounter    -  Primary   Per history likely a partial hamstring tear, Advil, heat, gentle stretching, call back if you need a PT referral   Relevant Medications   tizanidine (ZANAFLEX) 2 MG capsule       Follow up plan: Return if symptoms worsen or fail to improve.  Counseling provided for all of the vaccine components No orders of the defined types were placed in this encounter.   Caryl Pina, MD [facility] 09/14/2016, 9:32 AM

## 2016-09-30 ENCOUNTER — Encounter: Payer: Self-pay | Admitting: Family

## 2016-10-17 ENCOUNTER — Ambulatory Visit (INDEPENDENT_AMBULATORY_CARE_PROVIDER_SITE_OTHER)
Admission: RE | Admit: 2016-10-17 | Discharge: 2016-10-17 | Disposition: A | Discharge status: Home / Self Care | Payer: BLUE CROSS/BLUE SHIELD | Source: Ambulatory Visit | Attending: Family | Admitting: Family

## 2016-10-17 ENCOUNTER — Ambulatory Visit (HOSPITAL_COMMUNITY)
Admission: RE | Admit: 2016-10-17 | Discharge: 2016-10-17 | Disposition: A | Discharge status: Home / Self Care | Payer: BLUE CROSS/BLUE SHIELD | Source: Ambulatory Visit | Attending: Family | Admitting: Family

## 2016-10-17 ENCOUNTER — Encounter: Payer: Self-pay | Admitting: Family

## 2016-10-17 ENCOUNTER — Ambulatory Visit (INDEPENDENT_AMBULATORY_CARE_PROVIDER_SITE_OTHER): Payer: BLUE CROSS/BLUE SHIELD | Admitting: Family

## 2016-10-17 VITALS — BP 115/73 | HR 74 | Temp 97.0°F | Resp 16 | Ht 71.0 in | Wt 193.0 lb

## 2016-10-17 DIAGNOSIS — Z87891 Personal history of nicotine dependence: Secondary | ICD-10-CM

## 2016-10-17 DIAGNOSIS — I739 Peripheral vascular disease, unspecified: Secondary | ICD-10-CM

## 2016-10-17 DIAGNOSIS — Z959 Presence of cardiac and vascular implant and graft, unspecified: Secondary | ICD-10-CM

## 2016-10-17 DIAGNOSIS — I779 Disorder of arteries and arterioles, unspecified: Secondary | ICD-10-CM | POA: Diagnosis not present

## 2016-10-17 DIAGNOSIS — Z48812 Encounter for surgical aftercare following surgery on the circulatory system: Secondary | ICD-10-CM | POA: Insufficient documentation

## 2016-10-17 DIAGNOSIS — Z95820 Peripheral vascular angioplasty status with implants and grafts: Secondary | ICD-10-CM

## 2016-10-17 NOTE — Progress Notes (Signed)
[facility] [place]   CC: Follow up peripheral artery occlusive disease  History of Present Illness Tommy [name] is a 66 y.o. male patient of Dr. Trula Slade who is status post left SFA stent October 2009 with PTA and second stent in February 2012. He returns today for follow up.  Patient denies claudication symptoms with walking, denies non healing ulcers on lower extremities. He reports remaining physically active at least 30 minutes daily.  He denies any history of stroke or TIA. He was diagnosed with prostate cancer, had seed implants in July 2016.  Pt Diabetic: No Pt smoker: former smoker, quit in 2013  Pt meds include: Statin :Yes Betablocker: No ASA: no Other anticoagulants/antiplatelets: Plavix    Past Medical History:  Diagnosis Date  . BPH with obstruction/lower urinary tract symptoms    hyperplasia  . ED (erectile dysfunction)    arterial insuff.  . History of DVT of lower extremity   . History of gastric ulcer   . Hyperlipidemia   . Hypertension   . Peripheral vascular disease ([place])   . Prostate cancer [facility]) DX 01/01/15--  urologist-  dr Jeffie Pollock   Stage T1c, Gleason 3+4, PSA 5.73, vol 17ml  . Wears glasses     Social History Social History  Substance Use Topics  . Smoking status: Former Smoker    Packs/day: 1.00    Years: 15.00    Types: Cigarettes    Start date: 09/09/2010    Quit date: 04/21/2011  . Smokeless tobacco: Former Systems developer    Types: Snuff    Quit date: 04/23/2015     Comment: occasional  someday dips tobacco  . Alcohol use No    Family History Family History  Problem Relation Age of Onset  . Heart disease Mother   . Cancer Mother   . Deep vein thrombosis Mother   . Diabetes Mother   . Hyperlipidemia Mother   . Hypertension Mother   . Heart attack Mother   . Peripheral vascular disease Mother   . Cancer Father     Past Surgical History:  Procedure Laterality Date  . FEMORAL ARTERY STENT Left 07-22-2008  &   11-16-2010   left SFA//   In-Stent restenosis  . ORIF LEFT INDEX FINGER FX  09-28-2006  . PROSTATE BIOPSY  01/01/15  . PROSTATE SURGERY  July 2016   Seed Implant  . RADIOACTIVE SEED IMPLANT N/A 04/23/2015   Procedure: RADIOACTIVE SEED IMPLANT/BRACHYTHERAPY IMPLANT;  Surgeon: Irine Seal, MD;  Location: [facility];  Service: Urology;  Laterality: N/A;  . TONSILLECTOMY  as child    Allergies  Allergen Reactions  . Penicillins Swelling    Has patient had a PCN reaction causing immediate rash, facial/tongue/throat swelling, SOB or lightheadedness with hypotension: No Has patient had a PCN reaction causing severe rash involving mucus membranes or skin necrosis: No Has patient had a PCN reaction that required hospitalization No Has patient had a PCN reaction occurring within the last 10 years: No If all of the above answers are "NO", then may proceed with Cephalosporin use.    Current Outpatient Prescriptions  Medication Sig Dispense Refill  . amLODipine (NORVASC) 5 MG tablet Take 1 tablet (5 mg total) by mouth daily. 90 tablet 2  . clopidogrel (PLAVIX) 75 MG tablet Take 1 tablet (75 mg total) by mouth daily. 90 tablet 2  . esomeprazole (NEXIUM) 20 MG capsule Take 1 capsule (20 mg total) by mouth daily at 12 noon. 30 capsule  5  . fenofibrate (TRICOR) 145 MG tablet Take 1 tablet (145 mg total) by mouth every morning. 90 tablet 2  . hydrochlorothiazide (HYDRODIURIL) 25 MG tablet Take 1 tablet (25 mg total) by mouth daily. 90 tablet 2  . rosuvastatin (CRESTOR) 10 MG tablet Take 1 tablet (10 mg total) by mouth every evening. 90 tablet 2  . tamsulosin (FLOMAX) 0.4 MG CAPS capsule Take 1 capsule (0.4 mg total) by mouth daily after supper. 90 capsule 2  . tizanidine (ZANAFLEX) 2 MG capsule Take 1 capsule (2 mg total) by mouth 3 (three) times daily. (Patient not taking: Reported on 10/17/2016) 45 capsule 0  . tiZANidine (ZANAFLEX) 2 MG tablet   0   No current facility-administered  medications for this visit.     ROS: See HPI for pertinent positives and negatives.   Physical Examination  Vitals:   10/17/16 1014  BP: 115/73  Pulse: 74  Resp: 16  Temp: 97 F (36.1 C)  SpO2: 95%  Weight: 193 lb (87.5 kg)  Height: 5\' 11"  (1.803 m)   Body mass index is 26.92 kg/m.  General: A&O x 3, WDWN Gait: normal Eyes: PERRLA, Pulmonary: Respirations are non labored, CTAB, without wheezes , rales or rhonchi Cardiac: regular rhythm, no detected murmur     Carotid Bruits Left Right   Negative Negative   Radial pulses palpable bilaterally at 2+ Aorta is not palpable   VASCULAR EXAM: Extremities without ischemic changes  without Gangrene; without open wounds.      LE Pulses LEFT RIGHT       FEMORAL 2+ palpable 2+ palpable   POPLITEAL not palpable  not palpable   POSTERIOR TIBIAL  2+palpable  2+ palpable    DORSALIS PEDIS  ANTERIOR TIBIAL 2+palpable  2+palpable    Abdomen: soft, NT, no palpable masses Skin: no rashes, no ulcers Musculoskeletal: no muscle wasting or atrophy Neurologic: A&O X 3; Appropriate Affect, normal sensation, MOTOR FUNCTION: moving all extremities equally. Speech is fluent/normal. CN 2-12 intact.     ASSESSMENT: Tommy [name] is a 66 y.o. male who is status post left SFA stent October 2009 with PTA and second stent in February 2012. He has no claudication symptoms with walking, no signs no signs of ischemia in his feet/legs.   Fortunately he does not have DM, he is a former smoker.    DATA Today's left LE arterial duplex suggests a widely patent left SFA stent with all triphasic waveforms. No significant change compared to the last exam on  08-24-15. ABI's and TBI's remain normal with all  triphasic waveforms.     PLAN:  Based on the patient's vascular studies and examination, pt will return to clinic in 1 year with ABI's. I advised pt to notify us should he develop non healing wounds in his feet/legs or pain/weakness in his legs with walking. Continue walking at least 30 minutes daily.  I discussed in depth with the patient the nature of atherosclerosis, and emphasized the importance of maximal medical management including strict control of blood pressure, blood glucose, and lipid levels, obtaining regular exercise, and continued cessation of smoking.  The patient is aware that without maximal medical management the underlying atherosclerotic disease process will progress, limiting the benefit of any interventions.  The patient was given information about PAD including signs, symptoms, treatment, what symptoms should prompt the patient to seek immediate medical care, and risk reduction measures to take.  Vinnie Level Kieli Golladay, RN, MSN, FNP-C [facility] [place]  Office Phone: [phone]  Clinic MD: Trula Slade  10/17/16 10:33 AM

## 2016-10-17 NOTE — Patient Instructions (Signed)

## 2016-10-18 ENCOUNTER — Telehealth: Payer: Self-pay | Admitting: Family Medicine

## 2016-10-18 NOTE — Addendum Note (Signed)
Addended by: Lianne Cure A on: 10/18/2016 02:48 PM   Modules accepted: Orders

## 2016-10-18 NOTE — Telephone Encounter (Signed)
Pt wants appt to discuss BP appt scheduled

## 2016-10-26 ENCOUNTER — Ambulatory Visit: Payer: BLUE CROSS/BLUE SHIELD | Admitting: Family Medicine

## 2016-10-27 ENCOUNTER — Ambulatory Visit: Payer: BLUE CROSS/BLUE SHIELD | Admitting: Family Medicine

## 2016-10-31 ENCOUNTER — Ambulatory Visit (INDEPENDENT_AMBULATORY_CARE_PROVIDER_SITE_OTHER): Payer: BLUE CROSS/BLUE SHIELD | Admitting: Family Medicine

## 2016-10-31 ENCOUNTER — Encounter: Payer: Self-pay | Admitting: Family Medicine

## 2016-10-31 VITALS — BP 141/91 | HR 70 | Temp 97.8°F | Ht 71.0 in | Wt 195.0 lb

## 2016-10-31 DIAGNOSIS — I10 Essential (primary) hypertension: Secondary | ICD-10-CM | POA: Diagnosis not present

## 2016-10-31 MED ORDER — AMLODIPINE BESYLATE 10 MG PO TABS: ORAL_TABLET | ORAL | 2 refills | Status: DC

## 2016-10-31 NOTE — Assessment & Plan Note (Signed)
BP has been elevated and he is been having headaches, we will increase his amlodipine to 10 mg daily.

## 2016-10-31 NOTE — Progress Notes (Signed)
BP (!) 141/91   Pulse 70   Temp 97.8 F (36.6 C) (Oral)   Ht 5\' 11"  (1.803 m)   Wt 195 lb (88.5 kg)   BMI 27.20 kg/m    Subjective:    Patient ID: Tommy [name], male    DOB: 1951/07/05, 66 y.o.   MRN: [id number]  HPI: Tommy [name] is a 66 y.o. male presenting on 10/31/2016 for Hypertension (has been running high) and Headache   HPI Hypertension and headache Patient is coming in for hypertension recheck. His blood pressure today is elevated at 141/91 and he says that has been similarly elevated at his workplace and even up to 150/103. He says is also been experiencing some frontal headaches along with it when his blood pressures elevated. Patient denies blurred vision, chest pains, shortness of breath, or weakness. Denies any side effects from medication and is content with current medication.   Relevant past medical, surgical, family and social history reviewed and updated as indicated. Interim medical history since our last visit reviewed. Allergies and medications reviewed and updated.  Review of Systems  Constitutional: Negative for chills and fever.  Eyes: Negative for discharge.  Respiratory: Negative for shortness of breath and wheezing.   Cardiovascular: Negative for chest pain and leg swelling.  Musculoskeletal: Negative for back pain and gait problem.  Skin: Negative for rash.  Neurological: Positive for headaches. Negative for dizziness, weakness and light-headedness.  All other systems reviewed and are negative.   Per HPI unless specifically indicated above     Objective:    BP (!) 141/91   Pulse 70   Temp 97.8 F (36.6 C) (Oral)   Ht 5\' 11"  (1.803 m)   Wt 195 lb (88.5 kg)   BMI 27.20 kg/m   Wt Readings from Last 3 Encounters:  10/31/16 195 lb (88.5 kg)  10/17/16 193 lb (87.5 kg)  09/14/16 197 lb 4 oz (89.5 kg)    Physical Exam  Constitutional: He is oriented to person, place, and time. He appears well-developed and well-nourished. No distress.  Eyes:  Conjunctivae are normal. Right eye exhibits no discharge. Left eye exhibits no discharge. No scleral icterus.  Neck: Neck supple. No thyromegaly present.  Cardiovascular: Normal rate, regular rhythm, normal heart sounds and intact distal pulses.   No murmur heard. Pulmonary/Chest: Effort normal and breath sounds normal. No respiratory distress. He has no wheezes. He has no rales.  Musculoskeletal: Normal range of motion. He exhibits no edema.  Lymphadenopathy:    He has no cervical adenopathy.  Neurological: He is alert and oriented to person, place, and time. Coordination normal.  Skin: Skin is warm and dry. No rash noted. He is not diaphoretic.  Psychiatric: He has a normal mood and affect. His behavior is normal.  Nursing note and vitals reviewed.     Assessment & Plan:   Problem List Items Addressed This Visit      Cardiovascular and Mediastinum   HTN (hypertension) - Primary    BP has been elevated and he is been having headaches, we will increase his amlodipine to 10 mg daily.      Relevant Medications   amLODipine (NORVASC) 10 MG tablet       Follow up plan: Return in about 4 months (around 02/28/2017), or if symptoms worsen or fail to improve.  Counseling provided for all of the vaccine components No orders of the defined types were placed in this encounter.   Caryl Pina, MD [facility] 10/31/2016, 1:14 PM

## 2017-03-10 ENCOUNTER — Encounter: Payer: Self-pay | Admitting: Family Medicine

## 2017-03-10 ENCOUNTER — Ambulatory Visit (INDEPENDENT_AMBULATORY_CARE_PROVIDER_SITE_OTHER): Payer: BLUE CROSS/BLUE SHIELD | Admitting: Family Medicine

## 2017-03-10 VITALS — BP 147/91 | HR 102 | Temp 99.0°F | Ht 71.0 in | Wt 191.0 lb

## 2017-03-10 DIAGNOSIS — L03319 Cellulitis of trunk, unspecified: Secondary | ICD-10-CM | POA: Diagnosis not present

## 2017-03-10 DIAGNOSIS — R030 Elevated blood-pressure reading, without diagnosis of hypertension: Secondary | ICD-10-CM | POA: Diagnosis not present

## 2017-03-10 DIAGNOSIS — L02219 Cutaneous abscess of trunk, unspecified: Secondary | ICD-10-CM | POA: Diagnosis not present

## 2017-03-10 MED ORDER — SULFAMETHOXAZOLE-TRIMETHOPRIM 800-160 MG PO TABS: 1.0000 | ORAL_TABLET | Freq: Two times a day (BID) | ORAL | 0 refills | Status: DC

## 2017-03-10 NOTE — Progress Notes (Signed)
BP (!) 170/100   Pulse (!) 102   Temp 99 F (37.2 C) (Oral)   Ht 5\' 11"  (1.803 m)   Wt 191 lb (86.6 kg)   BMI 26.64 kg/m    Subjective:    Patient ID: Tommy [name], male    DOB: [birth year], 66 y.o.   MRN: [id number]  HPI: Tommy [name] is a 66 y.o. male presenting on 03/10/2017 for Insect Bite (on upper Back, area red and swollen, draining)   HPI Skin infection on upper back Patient has developed a skin infection on upper right back that he thinks may have started like a bite and then has developed and worsened to where he has swelling and redness and has been getting thick purulent drainage out of it. He denies any fevers or chills or swelling or redness anywhere else. He denies any pain anywhere else. He denies any history of recurrent skin infections.  Relevant past medical, surgical, family and social history reviewed and updated as indicated. Interim medical history since our last visit reviewed. Allergies and medications reviewed and updated.  Review of Systems  Constitutional: Negative for chills and fever.  Respiratory: Negative for shortness of breath and wheezing.   Cardiovascular: Negative for chest pain and leg swelling.  Musculoskeletal: Negative for back pain and gait problem.  Skin: Positive for color change. Negative for rash.  All other systems reviewed and are negative.   Per HPI unless specifically indicated above        Objective:    BP (!) 170/100   Pulse (!) 102   Temp 99 F (37.2 C) (Oral)   Ht 5\' 11"  (1.803 m)   Wt 191 lb (86.6 kg)   BMI 26.64 kg/m   Wt Readings from Last 3 Encounters:  03/10/17 191 lb (86.6 kg)  10/31/16 195 lb (88.5 kg)  10/17/16 193 lb (87.5 kg)    Physical Exam  Constitutional: He is oriented to person, place, and time. He appears well-developed and well-nourished. No distress.  Eyes: Conjunctivae are normal. No scleral icterus.  Neurological: He is alert and oriented to person, place, and time. Coordination normal.    Skin: Skin is warm and dry. Lesion (2.5 cm area of induration and fluctuation with about the same size of erythema overlying the skin. Lesion comes to head in the couple different places on his back.) noted. No rash noted. He is not diaphoretic. There is erythema.  Psychiatric: He has a normal mood and affect. His behavior is normal.  Nursing note and vitals reviewed.   I&D: Region was anesthetized with topical ethyl chloride. Incision was made on anterior medial aspect of the open wound. Significant serosanguineous and purulent drainage was exuded. Culture was taken. Probe was used to probe the area and break apart any loculations. Bleeding was minimal and patient tolerated procedure well. Thick sebaceous material exuded as well      Assessment & Plan:   Problem List Items Addressed This Visit    None    Visit Diagnoses    Cellulitis and abscess of trunk    -  Primary   Relevant Medications   sulfamethoxazole-trimethoprim (BACTRIM DS,SEPTRA DS) 800-160 MG tablet   Other Relevant Orders   Anaerobic and Aerobic Culture   Elevated blood pressure reading       Patient's did not take his medications yet this morning and that is why his blood pressure is elevated and he is in pain       Follow up  plan: Return if symptoms worsen or fail to improve.  Counseling provided for all of the vaccine components No orders of the defined types were placed in this encounter.   Caryl Pina, MD [facility] 03/10/2017, 8:16 AM

## 2017-03-14 LAB — ANAEROBIC AND AEROBIC CULTURE

## 2017-05-01 ENCOUNTER — Other Ambulatory Visit: Payer: Self-pay | Admitting: Family Medicine

## 2017-05-02 NOTE — Telephone Encounter (Signed)
Last lipid 07/11/16  Dr [name]

## 2017-05-29 ENCOUNTER — Other Ambulatory Visit: Payer: Self-pay | Admitting: Family Medicine

## 2017-06-21 ENCOUNTER — Ambulatory Visit (INDEPENDENT_AMBULATORY_CARE_PROVIDER_SITE_OTHER): Payer: BLUE CROSS/BLUE SHIELD | Admitting: Family Medicine

## 2017-06-21 ENCOUNTER — Ambulatory Visit (HOSPITAL_COMMUNITY)
Admission: RE | Admit: 2017-06-21 | Discharge: 2017-06-21 | Disposition: A | Discharge status: Home / Self Care | Payer: BLUE CROSS/BLUE SHIELD | Source: Ambulatory Visit | Attending: Family Medicine | Admitting: Family Medicine

## 2017-06-21 ENCOUNTER — Encounter: Payer: Self-pay | Admitting: Family Medicine

## 2017-06-21 VITALS — BP 135/81 | HR 58 | Temp 97.4°F | Ht 71.0 in | Wt 188.6 lb

## 2017-06-21 DIAGNOSIS — M7989 Other specified soft tissue disorders: Secondary | ICD-10-CM | POA: Diagnosis present

## 2017-06-21 NOTE — Progress Notes (Signed)
   BP 135/81   Pulse (!) 58   Temp (!) 97.4 F (36.3 C) (Oral)   Ht 5\' 11"  (1.803 m)   Wt 188 lb 9.6 oz (85.5 kg)   BMI 26.30 kg/m    Subjective:    Patient ID: Tommy [name], male    DOB: 1951-01-04, 66 y.o.   MRN: [id number]  HPI: Tommy [name] is a 66 y.o. male presenting on 06/21/2017 for Joint Swelling (right. x 2 weeks)   HPI Right leg swelling, ankle and calf Patient has been having right ankle and calf swelling and pain in his calf and ankle that is been going on for the past 2 weeks. He says he does have a history of some arterial issues and clots in his legs in the distant past. He says that it will swell more throughout the day and then gets better overnight. He denies any fevers or chills or redness or warmth. He denies any chest pain or shortness of breath or wheezing.  Relevant past medical, surgical, family and social history reviewed and updated as indicated. Interim medical history since our last visit reviewed. Allergies and medications reviewed and updated.  Review of Systems  Constitutional: Negative for chills and fever.  Eyes: Negative for discharge.  Respiratory: Negative for shortness of breath and wheezing.   Cardiovascular: Positive for leg swelling. Negative for chest pain.  Musculoskeletal: Negative for back pain and gait problem.  Skin: Negative for rash.  All other systems reviewed and are negative.   Per HPI unless specifically indicated above        Objective:    BP 135/81   Pulse (!) 58   Temp (!) 97.4 F (36.3 C) (Oral)   Ht 5\' 11"  (1.803 m)   Wt 188 lb 9.6 oz (85.5 kg)   BMI 26.30 kg/m   Wt Readings from Last 3 Encounters:  06/21/17 188 lb 9.6 oz (85.5 kg)  03/10/17 191 lb (86.6 kg)  10/31/16 195 lb (88.5 kg)    Physical Exam  Constitutional: He is oriented to person, place, and time. He appears well-developed and well-nourished. No distress.  Eyes: Conjunctivae are normal. No scleral icterus.  Musculoskeletal: Normal range of  motion. He exhibits edema (Trace edema in both legs, pain in right calf).  Neurological: He is alert and oriented to person, place, and time. Coordination normal.  Skin: Skin is warm and dry. No rash noted. He is not diaphoretic.  Psychiatric: He has a normal mood and affect. His behavior is normal.  Nursing note and vitals reviewed.     Assessment & Plan:   Problem List Items Addressed This Visit    None    Visit Diagnoses    Right leg swelling    -  Primary   Relevant Orders   US Venous Img Lower Unilateral Right       Follow up plan: Return if symptoms worsen or fail to improve.  Counseling provided for all of the vaccine components Orders Placed This Encounter  Procedures  . US Venous Img Lower Unilateral Right    Caryl Pina, MD [facility] 06/21/2017, 8:57 AM

## 2017-07-06 ENCOUNTER — Other Ambulatory Visit: Payer: Self-pay | Admitting: Family Medicine

## 2017-07-26 DIAGNOSIS — Z23 Encounter for immunization: Secondary | ICD-10-CM | POA: Diagnosis not present

## 2017-08-07 ENCOUNTER — Other Ambulatory Visit: Payer: Self-pay | Admitting: Family Medicine

## 2017-08-24 ENCOUNTER — Encounter: Payer: BLUE CROSS/BLUE SHIELD | Admitting: Family Medicine

## 2017-08-25 ENCOUNTER — Ambulatory Visit (INDEPENDENT_AMBULATORY_CARE_PROVIDER_SITE_OTHER): Payer: BLUE CROSS/BLUE SHIELD | Admitting: Family Medicine

## 2017-08-25 ENCOUNTER — Encounter: Payer: Self-pay | Admitting: Family Medicine

## 2017-08-25 ENCOUNTER — Encounter: Payer: BLUE CROSS/BLUE SHIELD | Admitting: Family Medicine

## 2017-08-25 VITALS — BP 131/88 | HR 72 | Temp 98.0°F | Ht 71.0 in | Wt 191.0 lb

## 2017-08-25 DIAGNOSIS — I739 Peripheral vascular disease, unspecified: Secondary | ICD-10-CM

## 2017-08-25 DIAGNOSIS — E785 Hyperlipidemia, unspecified: Secondary | ICD-10-CM

## 2017-08-25 DIAGNOSIS — Z0000 Encounter for general adult medical examination without abnormal findings: Secondary | ICD-10-CM

## 2017-08-25 DIAGNOSIS — I10 Essential (primary) hypertension: Secondary | ICD-10-CM

## 2017-08-25 LAB — CMP14+EGFR
ALK PHOS: 33 IU/L — AB (ref 39–117)
ALT: 34 IU/L (ref 0–44)
AST: 25 IU/L (ref 0–40)
Albumin/Globulin Ratio: 1.8 (ref 1.2–2.2)
Albumin: 4.5 g/dL (ref 3.6–4.8)
BUN/Creatinine Ratio: 14 (ref 10–24)
BUN: 17 mg/dL (ref 8–27)
Bilirubin Total: 0.9 mg/dL (ref 0.0–1.2)
CO2: 25 mmol/L (ref 20–29)
CREATININE: 1.19 mg/dL (ref 0.76–1.27)
Calcium: 10.8 mg/dL — ABNORMAL HIGH (ref 8.6–10.2)
Chloride: 106 mmol/L (ref 96–106)
GFR calc Af Amer: 72 mL/min/{1.73_m2} (ref >59)
GFR calc non Af Amer: 62 mL/min/{1.73_m2} (ref >59)
GLOBULIN, TOTAL: 2.5 g/dL (ref 1.5–4.5)
GLUCOSE: 85 mg/dL (ref 65–99)
Potassium: 4.9 mmol/L (ref 3.5–5.2)
SODIUM: 145 mmol/L — AB (ref 134–144)
Total Protein: 7 g/dL (ref 6.0–8.5)

## 2017-08-25 LAB — CBC WITH DIFFERENTIAL/PLATELET
BASOS: 0 %
Basophils Absolute: 0 10*3/uL (ref 0.0–0.2)
EOS (ABSOLUTE): 0.1 10*3/uL (ref 0.0–0.4)
EOS: 2 %
Hematocrit: 41.3 % (ref 37.5–51.0)
Hemoglobin: 14.1 g/dL (ref 13.0–17.7)
Immature Grans (Abs): 0 10*3/uL (ref 0.0–0.1)
Immature Granulocytes: 1 %
LYMPHS ABS: 1.4 10*3/uL (ref 0.7–3.1)
Lymphs: 32 %
MCH: 29.3 pg (ref 26.6–33.0)
MCHC: 34.1 g/dL (ref 31.5–35.7)
MCV: 86 fL (ref 79–97)
MONOCYTES: 8 %
MONOS ABS: 0.4 10*3/uL (ref 0.1–0.9)
NEUTROS ABS: 2.4 10*3/uL (ref 1.4–7.0)
Neutrophils: 57 %
PLATELETS: 204 10*3/uL (ref 150–379)
RBC: 4.82 x10E6/uL (ref 4.14–5.80)
RDW: 13.3 % (ref 12.3–15.4)
WBC: 4.3 10*3/uL (ref 3.4–10.8)

## 2017-08-25 LAB — LIPID PANEL
CHOLESTEROL TOTAL: 133 mg/dL (ref 100–199)
Chol/HDL Ratio: 3.4 ratio (ref 0.0–5.0)
HDL: 39 mg/dL — ABNORMAL LOW (ref >39)
LDL Calculated: 78 mg/dL (ref 0–99)
TRIGLYCERIDES: 78 mg/dL (ref 0–149)
VLDL CHOLESTEROL CAL: 16 mg/dL (ref 5–40)

## 2017-08-25 MED ORDER — TAMSULOSIN HCL 0.4 MG PO CAPS: ORAL_CAPSULE | ORAL | 5 refills | Status: DC

## 2017-08-25 MED ORDER — CLOPIDOGREL BISULFATE 75 MG PO TABS: 75.0000 mg | ORAL_TABLET | Freq: Every day | ORAL | 5 refills | Status: DC

## 2017-08-25 MED ORDER — AMLODIPINE BESYLATE 10 MG PO TABS: ORAL_TABLET | ORAL | 2 refills | Status: DC

## 2017-08-25 MED ORDER — FENOFIBRATE 145 MG PO TABS: ORAL_TABLET | ORAL | 2 refills | Status: DC

## 2017-08-25 MED ORDER — ROSUVASTATIN CALCIUM 10 MG PO TABS: 10.0000 mg | ORAL_TABLET | Freq: Every evening | ORAL | 2 refills | Status: DC

## 2017-08-25 MED ORDER — HYDROCHLOROTHIAZIDE 25 MG PO TABS: 25.0000 mg | ORAL_TABLET | Freq: Every day | ORAL | 2 refills | Status: DC

## 2017-08-25 NOTE — Progress Notes (Signed)
BP 131/88   Pulse 72   Temp 98 F (36.7 C) (Oral)   Ht '5\' 11"'$  (1.803 m)   Wt 191 lb (86.6 kg)   BMI 26.64 kg/m    Subjective:    Patient ID: Tommy [name], male    DOB: [birth year], 66 y.o.   MRN: [id number]  HPI: Tommy [name] is a 66 y.o. male presenting on 08/25/2017 for Annual Exam   HPI Adult well exam Patient works at [employer] and wants to get an adult well exam today.  He needs a paper signed for work saying that he got the physical done. Patient denies any chest pain, shortness of breath, headaches or vision issues, abdominal complaints, diarrhea, nausea, vomiting, or joint issues.   Hypertension Patient is currently on amlodipine and hydrochlorothiazide, and their blood pressure today is 131/88. Patient denies any lightheadedness or dizziness. Patient denies headaches, blurred vision, chest pains, shortness of breath, or weakness. Denies any side effects from medication and is content with current medication.   Hyperlipidemia Patient is coming in for recheck of his hyperlipidemia. The patient is currently taking fenofibrate and Crestor. They deny any issues with myalgias or history of liver damage from it. They deny any focal numbness or weakness or chest pain.   Relevant past medical, surgical, family and social history reviewed and updated as indicated. Interim medical history since our last visit reviewed. Allergies and medications reviewed and updated.  Review of Systems  Constitutional: Negative for chills and fever.  HENT: Negative for ear pain and tinnitus.   Eyes: Negative for pain.  Respiratory: Negative for cough, shortness of breath and wheezing.   Cardiovascular: Negative for chest pain, palpitations and leg swelling.  Gastrointestinal: Negative for abdominal pain, blood in stool, constipation and diarrhea.  Genitourinary: Negative for dysuria and hematuria.  Musculoskeletal: Negative for back pain and myalgias.  Skin: Negative for rash.  Neurological:  Negative for dizziness, weakness and headaches.  Psychiatric/Behavioral: Negative for suicidal ideas.    Per HPI unless specifically indicated above        Objective:    BP 131/88   Pulse 72   Temp 98 F (36.7 C) (Oral)   Ht '5\' 11"'$  (1.803 m)   Wt 191 lb (86.6 kg)   BMI 26.64 kg/m   Wt Readings from Last 3 Encounters:  08/25/17 191 lb (86.6 kg)  06/21/17 188 lb 9.6 oz (85.5 kg)  03/10/17 191 lb (86.6 kg)    Physical Exam  Constitutional: He is oriented to person, place, and time. He appears well-developed and well-nourished. No distress.  HENT:  Right Ear: External ear normal.  Left Ear: External ear normal.  Nose: Nose normal.  Mouth/Throat: Oropharynx is clear and moist. No oropharyngeal exudate.  Eyes: Conjunctivae and EOM are normal. Pupils are equal, round, and reactive to light. Right eye exhibits no discharge. No scleral icterus.  Neck: Neck supple. No thyromegaly present.  Cardiovascular: Normal rate, regular rhythm, normal heart sounds and intact distal pulses.  No murmur heard. Pulmonary/Chest: Effort normal and breath sounds normal. No respiratory distress. He has no wheezes.  Abdominal: Soft. Bowel sounds are normal. He exhibits no distension. There is no tenderness. There is no rebound and no guarding.  Musculoskeletal: Normal range of motion. He exhibits no edema.  Lymphadenopathy:    He has no cervical adenopathy.  Neurological: He is alert and oriented to person, place, and time. Coordination normal.  Skin: Skin is warm and dry. No rash noted. He  is not diaphoretic.  Psychiatric: He has a normal mood and affect. His behavior is normal.  Nursing note and vitals reviewed.       Assessment & Plan:   Problem List Items Addressed This Visit      Cardiovascular and Mediastinum   PAD (peripheral artery disease) (HCC)   Relevant Medications   rosuvastatin (CRESTOR) 10 MG tablet   hydrochlorothiazide (HYDRODIURIL) 25 MG tablet   fenofibrate (TRICOR) 145  MG tablet   amLODipine (NORVASC) 10 MG tablet   Other Relevant Orders   CBC with Differential/Platelet   HTN (hypertension)   Relevant Medications   rosuvastatin (CRESTOR) 10 MG tablet   hydrochlorothiazide (HYDRODIURIL) 25 MG tablet   fenofibrate (TRICOR) 145 MG tablet   amLODipine (NORVASC) 10 MG tablet   Other Relevant Orders   CMP14+EGFR     Other   Hyperlipidemia LDL goal <130   Relevant Medications   rosuvastatin (CRESTOR) 10 MG tablet   hydrochlorothiazide (HYDRODIURIL) 25 MG tablet   fenofibrate (TRICOR) 145 MG tablet   amLODipine (NORVASC) 10 MG tablet   Other Relevant Orders   Lipid panel    Other Visit Diagnoses    Well adult exam    -  Primary   Relevant Orders   CBC with Differential/Platelet   CMP14+EGFR   Lipid panel       Follow up plan: No Follow-up on file.  Counseling provided for all of the vaccine components Orders Placed This Encounter  Procedures  . CBC with Differential/Platelet  . CMP14+EGFR  . Lipid panel    Caryl Pina, MD [facility] 08/25/2017, 11:38 AM

## 2017-08-30 ENCOUNTER — Ambulatory Visit: Payer: BLUE CROSS/BLUE SHIELD | Admitting: Family

## 2017-09-27 ENCOUNTER — Encounter: Payer: Self-pay | Admitting: Family Medicine

## 2017-09-27 ENCOUNTER — Ambulatory Visit: Payer: BLUE CROSS/BLUE SHIELD | Admitting: Family Medicine

## 2017-09-27 VITALS — BP 141/87 | HR 77 | Temp 99.6°F | Ht 71.0 in | Wt 196.0 lb

## 2017-09-27 DIAGNOSIS — J0110 Acute frontal sinusitis, unspecified: Secondary | ICD-10-CM | POA: Diagnosis not present

## 2017-09-27 MED ORDER — CEFDINIR 300 MG PO CAPS: 300.0000 mg | ORAL_CAPSULE | Freq: Two times a day (BID) | ORAL | 0 refills | Status: DC

## 2017-09-27 NOTE — Progress Notes (Signed)
BP (!) 141/87   Pulse 77   Temp 99.6 F (37.6 C) (Oral)   Ht 5\' 11"  (1.803 m)   Wt 196 lb (88.9 kg)   BMI 27.34 kg/m    Subjective:    Patient ID: Tommy [name], male    DOB: 07-Jun-1951, 66 y.o.   MRN: [id number]  HPI: Tommy [name] is a 66 y.o. male presenting on 09/27/2017 for Sinusitis (runny nose, sinus congestion, headache and sinus pressure, cough; symptoms began 1 week ago)   HPI Sinus congestion and headache and cough Patient comes in with sinus congestion and headache and cough that is been going on for the past week.  He has been using some over-the-counter allergy medication and sinus medication without much success.  He denies any fevers or chills or shortness of breath or wheezing.  He does feel like it starting to get down into his chest.  He denies any sick contacts that he knows of.  Patient is a former smoker and lives with smokers was counseled on this.  Relevant past medical, surgical, family and social history reviewed and updated as indicated. Interim medical history since our last visit reviewed. Allergies and medications reviewed and updated.  Review of Systems  Constitutional: Negative for chills and fever.  HENT: Positive for congestion, ear pain, postnasal drip, rhinorrhea, sinus pressure and sore throat. Negative for ear discharge, sneezing and voice change.   Eyes: Negative for pain, discharge, redness and visual disturbance.  Respiratory: Positive for cough. Negative for shortness of breath and wheezing.   Cardiovascular: Negative for chest pain and leg swelling.  Musculoskeletal: Negative for gait problem.  Skin: Negative for rash.  All other systems reviewed and are negative.   Per HPI unless specifically indicated above        Objective:    BP (!) 165/90   Pulse 77   Temp 99.6 F (37.6 C) (Oral)   Ht 5\' 11"  (1.803 m)   Wt 196 lb (88.9 kg)   BMI 27.34 kg/m   Wt Readings from Last 3 Encounters:  09/27/17 196 lb (88.9 kg)  08/25/17 191  lb (86.6 kg)  06/21/17 188 lb 9.6 oz (85.5 kg)    Physical Exam  Constitutional: He is oriented to person, place, and time. He appears well-developed and well-nourished. No distress.  HENT:  Right Ear: Tympanic membrane, external ear and ear canal normal.  Left Ear: Tympanic membrane, external ear and ear canal normal.  Nose: Mucosal edema and rhinorrhea present. No sinus tenderness. No epistaxis. Right sinus exhibits frontal sinus tenderness. Right sinus exhibits no maxillary sinus tenderness. Left sinus exhibits frontal sinus tenderness. Left sinus exhibits no maxillary sinus tenderness.  Mouth/Throat: Uvula is midline and mucous membranes are normal. Posterior oropharyngeal edema and posterior oropharyngeal erythema present. No oropharyngeal exudate or tonsillar abscesses.  Eyes: Conjunctivae and EOM are normal. Pupils are equal, round, and reactive to light. No scleral icterus.  Neck: Neck supple. No thyromegaly present.  Cardiovascular: Normal rate, regular rhythm, normal heart sounds and intact distal pulses.  No murmur heard. Pulmonary/Chest: Effort normal and breath sounds normal. No respiratory distress. He has no wheezes. He has no rales.  Musculoskeletal: Normal range of motion. He exhibits no edema.  Lymphadenopathy:    He has no cervical adenopathy.  Neurological: He is alert and oriented to person, place, and time. Coordination normal.  Skin: Skin is warm and dry. No rash noted. He is not diaphoretic.  Psychiatric: He has a normal mood  and affect. His behavior is normal.  Nursing note and vitals reviewed.       Assessment & Plan:   Problem List Items Addressed This Visit    None    Visit Diagnoses    Acute non-recurrent frontal sinusitis    -  Primary   Relevant Medications   cefdinir (OMNICEF) 300 MG capsule       Follow up plan: Return if symptoms worsen or fail to improve.  Counseling provided for all of the vaccine components No orders of the defined types  were placed in this encounter.   Caryl Pina, MD [facility] 09/27/2017, 9:09 AM

## 2017-10-17 ENCOUNTER — Ambulatory Visit (HOSPITAL_COMMUNITY)
Admission: RE | Admit: 2017-10-17 | Discharge: 2017-10-17 | Disposition: A | Discharge status: Home / Self Care | Payer: BLUE CROSS/BLUE SHIELD | Source: Ambulatory Visit | Attending: Family | Admitting: Family

## 2017-10-17 ENCOUNTER — Encounter: Payer: Self-pay | Admitting: Family

## 2017-10-17 ENCOUNTER — Ambulatory Visit (INDEPENDENT_AMBULATORY_CARE_PROVIDER_SITE_OTHER): Payer: BLUE CROSS/BLUE SHIELD | Admitting: Family

## 2017-10-17 VITALS — BP 172/91 | HR 71 | Resp 18 | Ht 71.0 in | Wt 192.0 lb

## 2017-10-17 DIAGNOSIS — Z87891 Personal history of nicotine dependence: Secondary | ICD-10-CM

## 2017-10-17 DIAGNOSIS — Z959 Presence of cardiac and vascular implant and graft, unspecified: Secondary | ICD-10-CM | POA: Insufficient documentation

## 2017-10-17 DIAGNOSIS — I779 Disorder of arteries and arterioles, unspecified: Secondary | ICD-10-CM | POA: Insufficient documentation

## 2017-10-17 DIAGNOSIS — Z95820 Peripheral vascular angioplasty status with implants and grafts: Secondary | ICD-10-CM

## 2017-10-17 NOTE — Patient Instructions (Signed)

## 2017-10-17 NOTE — Progress Notes (Signed)
[facility]   CC: Follow up peripheral artery occlusive disease  History of Present Illness Tommy [name] is a 67 y.o. male patient of Dr. Trula Slade who is status post left SFA stent October 2009 with PTA and second stent in February 2012. He returns today for follow up.  Patient denies claudication symptoms with walking, denies non healing ulcers on lower extremities.  He walks 20-40 minutes daily.   He denies any history of stroke or TIA. He was diagnosed with prostate cancer, had seed implants in July 2016.  Pt Diabetic: No Pt smoker: former smoker, quit in 2013  Pt meds include: Statin :Yes Betablocker: No ASA: no Other anticoagulants/antiplatelets: Plavix      Past Medical History:  Diagnosis Date  . BPH with obstruction/lower urinary tract symptoms    hyperplasia  . ED (erectile dysfunction)    arterial insuff.  . History of DVT of lower extremity   . History of gastric ulcer   . Hyperlipidemia   . Hypertension   . Peripheral vascular disease ([place])   . Prostate cancer [facility]) DX 01/01/15--  urologist-  dr Jeffie Pollock   Stage T1c, Gleason 3+4, PSA 5.73, vol 2ml  . Wears glasses     Social History Social History   Tobacco Use  . Smoking status: Former Smoker    Packs/day: 1.00    Years: 15.00    Pack years: 15.00    Types: Cigarettes    Start date: 09/09/2010    Last attempt to quit: 04/21/2011    Years since quitting: 6.4  . Smokeless tobacco: Former Systems developer    Types: Snuff    Quit date: 04/23/2015  . Tobacco comment: occasional  someday dips tobacco  Substance Use Topics  . Alcohol use: No    Alcohol/week: 0.0 oz  . Drug use: No    Family History Family History  Problem Relation Age of Onset  . Heart disease Mother   . Cancer Mother   . Deep vein thrombosis Mother   . Diabetes Mother   . Hyperlipidemia Mother   . Hypertension Mother   . Heart attack Mother   . Peripheral vascular disease Mother   . Cancer Father      Past Surgical History:  Procedure Laterality Date  . FEMORAL ARTERY STENT Left 07-22-2008  &  11-16-2010   left SFA//   In-Stent restenosis  . ORIF LEFT INDEX FINGER FX  09-28-2006  . PROSTATE BIOPSY  01/01/15  . PROSTATE SURGERY  July 2016   Seed Implant  . RADIOACTIVE SEED IMPLANT N/A 04/23/2015   Procedure: RADIOACTIVE SEED IMPLANT/BRACHYTHERAPY IMPLANT;  Surgeon: Irine Seal, MD;  Location: [facility];  Service: Urology;  Laterality: N/A;  . TONSILLECTOMY  as child    Allergies  Allergen Reactions  . Penicillins Swelling    Has patient had a PCN reaction causing immediate rash, facial/tongue/throat swelling, SOB or lightheadedness with hypotension: No Has patient had a PCN reaction causing severe rash involving mucus membranes or skin necrosis: No Has patient had a PCN reaction that required hospitalization No Has patient had a PCN reaction occurring within the last 10 years: No If all of the above answers are "NO", then may proceed with Cephalosporin use.    Current Outpatient Medications  Medication Sig Dispense Refill  . amLODipine (NORVASC) 10 MG tablet Take 1 tablet (10 mg total) by mouth daily. 30 tablet 2  . cefdinir (OMNICEF) 300 MG capsule Take 1 capsule (300 mg  total) by mouth 2 (two) times daily. 1 po BID 20 capsule 0  . clopidogrel (PLAVIX) 75 MG tablet Take 1 tablet (75 mg total) daily by mouth. 30 tablet 5  . esomeprazole (NEXIUM) 20 MG capsule Take 1 capsule (20 mg total) by mouth daily at 12 noon. (Patient taking differently: Take 20 mg daily as needed by mouth. ) 30 capsule 5  . fenofibrate (TRICOR) 145 MG tablet TAKE (1) TABLET DAILY IN THE MORNING. 30 tablet 2  . hydrochlorothiazide (HYDRODIURIL) 25 MG tablet Take 1 tablet (25 mg total) daily by mouth. 30 tablet 2  . rosuvastatin (CRESTOR) 10 MG tablet Take 1 tablet (10 mg total) every evening by mouth. 30 tablet 2  . tamsulosin (FLOMAX) 0.4 MG CAPS capsule Take 1 capsule (0.4 mg total) by  mouth daily after supper. 30 capsule 5   No current facility-administered medications for this visit.     ROS: See HPI for pertinent positives and negatives.   Physical Examination  Vitals:   10/17/17 1137 10/17/17 1139  BP: (!) 147/85 (!) 172/91  Pulse: 71   Resp: 18   SpO2: 95%   Weight: 192 lb (87.1 kg)   Height: 5\' 11"  (1.803 m)    Body mass index is 26.78 kg/m.  General: A&O x 3, WDWN Gait: normal Eyes: PERRLA, Pulmonary: Respirations are non labored, CTAB, without wheezes , rales or rhonchi Cardiac: regular rhythm, no detected murmur     Carotid Bruits Left Right   Negative Negative   Radial pulses palpable bilaterally at 2+ Aorta is not palpable   VASCULAR EXAM: Extremitieswithout ischemic changes  without Gangrene; without open wounds.      LE Pulses LEFT RIGHT  FEMORAL 2+ palpable 2+ palpable   POPLITEAL not palpable  not palpable   POSTERIOR TIBIAL  2+palpable  2+ palpable    DORSALIS PEDIS  ANTERIOR TIBIAL 2+palpable  2+palpable    Abdomen: soft, NT, no palpable masses Skin: no rashes, no ulcers Musculoskeletal: no muscle wasting or atrophy Neurologic: A&O X 3; Appropriate Affect, normal sensation, MOTOR FUNCTION: moving all extremities equally. Speech is fluent/normal. CN 2-12 intact.    ASSESSMENT: Tommy [name] is a 67 y.o. male who is status post left SFA stent October 2009 with PTA and second stent in February 2012. He has no claudication symptoms with walking, no signs no signs of ischemia in his feet/legs.   Fortunately he does not have DM, he is a former smoker.    DATA  ABI (Date: 10/17/2017):  R:   ABI: 1.3 (was 1.19 on 10-17-16),   PT: waveform morphology not documented (was  triphasic)  DP: waveform morphology not documented (was triphasic)  TBI:  0.88  L:   ABI: 1.14 (was 1.24),   PT: waveform morphology not documented (was triphasic)  DP: waveform morphology not documented (was triphasic)  TBI: 0.98 ABI's and TBI's remain normal with all triphasic waveforms.    Left LE Arterial Duplex (10-17-16): Patent left SFA stent with all triphasic waveforms. No significant change compared to the last exam on  08-24-15.    PLAN:  Based on the patient's vascular studies and examination, pt will return to clinic in 18 months with ABI's. I advised pt to notify us should he develop non healing wounds in his feet/legs or pain/weakness in his legs with walking. Continue walking at least 30 minutes daily.   I discussed in depth with the patient the nature of atherosclerosis, and emphasized the importance of maximal medical  management including strict control of blood pressure, blood glucose, and lipid levels, obtaining regular exercise, and continued cessation of smoking.  The patient is aware that without maximal medical management the underlying atherosclerotic disease process will progress, limiting the benefit of any interventions.  The patient was given information about PAD including signs, symptoms, treatment, what symptoms should prompt the patient to seek immediate medical care, and risk reduction measures to take.  Clemon Chambers, RN, MSN, FNP-C [facility] Phone: [phone]  Clinic MD: Early  10/17/17 11:53 AM

## 2017-10-25 ENCOUNTER — Encounter: Payer: Self-pay | Admitting: Family Medicine

## 2017-10-25 ENCOUNTER — Ambulatory Visit (INDEPENDENT_AMBULATORY_CARE_PROVIDER_SITE_OTHER): Payer: BLUE CROSS/BLUE SHIELD

## 2017-10-25 ENCOUNTER — Ambulatory Visit: Payer: BLUE CROSS/BLUE SHIELD | Admitting: Family Medicine

## 2017-10-25 VITALS — BP 168/86 | HR 75 | Ht 71.0 in | Wt 192.0 lb

## 2017-10-25 DIAGNOSIS — S139XXA Sprain of joints and ligaments of unspecified parts of neck, initial encounter: Secondary | ICD-10-CM

## 2017-10-25 MED ORDER — BACLOFEN 10 MG PO TABS: 10.0000 mg | ORAL_TABLET | Freq: Three times a day (TID) | ORAL | 0 refills | Status: DC

## 2017-10-25 MED ORDER — PREDNISONE 20 MG PO TABS: ORAL_TABLET | ORAL | 0 refills | Status: DC

## 2017-10-25 NOTE — Progress Notes (Signed)
BP (!) 168/86   Pulse 75   Ht 5\' 11"  (1.803 m)   Wt 192 lb (87.1 kg)   BMI 26.78 kg/m    Subjective:    Patient ID: Tommy [name], male    DOB: 10-31-1950, 67 y.o.   MRN: [id number]  HPI: Tommy [name] is a 67 y.o. male presenting on 10/25/2017 for Neck Pain (fell out of bed about 2 weeks ago, about 3 days later began having pain in right side of neck, radiates into shoulder)   HPI Neck pain Patient comes in complaining of neck pain on the right aspect of his neck.  He says he felt out of bed a couple weeks ago and landed on a nightstand on the left side of his face and since then has been having pain down the right side of his neck that shoots down to his shoulder and then causes some numbness to come around the back to the front over that shoulder as well.  He denies any tingling or numbness going down his arm.  He denies any weakness.  He says that the tightness or pain will flareup when he turns his head to the right side.  Relevant past medical, surgical, family and social history reviewed and updated as indicated. Interim medical history since our last visit reviewed. Allergies and medications reviewed and updated.  Review of Systems  Constitutional: Negative for chills and fever.  Respiratory: Negative for shortness of breath and wheezing.   Cardiovascular: Negative for chest pain and leg swelling.  Musculoskeletal: Positive for neck pain. Negative for back pain, gait problem and neck stiffness.  Skin: Negative for color change and rash.  All other systems reviewed and are negative.   Per HPI unless specifically indicated above        Objective:    BP (!) 168/86   Pulse 75   Ht 5\' 11"  (1.803 m)   Wt 192 lb (87.1 kg)   BMI 26.78 kg/m   Wt Readings from Last 3 Encounters:  10/25/17 192 lb (87.1 kg)  10/17/17 192 lb (87.1 kg)  09/27/17 196 lb (88.9 kg)    Physical Exam  Constitutional: He is oriented to person, place, and time. He appears well-developed and  well-nourished. No distress.  Eyes: Conjunctivae are normal. No scleral icterus.  Neck: Neck supple. No thyromegaly present.  Cardiovascular: Normal rate, regular rhythm, normal heart sounds and intact distal pulses.  No murmur heard. Pulmonary/Chest: Effort normal and breath sounds normal. No respiratory distress. He has no wheezes. He has no rales.  Musculoskeletal: Normal range of motion. He exhibits no edema.       Cervical back: He exhibits tenderness. He exhibits normal range of motion, no bony tenderness and no swelling.       Back:  Lymphadenopathy:    He has no cervical adenopathy.  Neurological: He is alert and oriented to person, place, and time. Coordination normal.  Skin: Skin is warm and dry. No rash noted. He is not diaphoretic.  Psychiatric: He has a normal mood and affect. His behavior is normal.  Nursing note and vitals reviewed.   Next x-ray: Await final read from radiologist    Assessment & Plan:   Problem List Items Addressed This Visit    None    Visit Diagnoses    Neck sprain, initial encounter    -  Primary   Relevant Medications   baclofen (LIORESAL) 10 MG tablet   predniSONE (DELTASONE) 20 MG tablet  Other Relevant Orders   DG Cervical Spine Complete       Follow up plan: Return if symptoms worsen or fail to improve.  Counseling provided for all of the vaccine components Orders Placed This Encounter  Procedures  . DG Cervical Spine Complete    Caryl Pina, MD [facility] 10/25/2017, 12:02 PM

## 2017-10-26 ENCOUNTER — Encounter: Payer: Self-pay | Admitting: *Deleted

## 2017-11-16 ENCOUNTER — Telehealth: Payer: Self-pay | Admitting: Family Medicine

## 2017-11-23 ENCOUNTER — Ambulatory Visit: Payer: BLUE CROSS/BLUE SHIELD | Admitting: Family Medicine

## 2017-11-23 ENCOUNTER — Encounter: Payer: Self-pay | Admitting: Family Medicine

## 2017-11-23 VITALS — BP 154/89 | HR 73 | Temp 97.6°F | Ht 71.0 in | Wt 194.0 lb

## 2017-11-23 DIAGNOSIS — M542 Cervicalgia: Secondary | ICD-10-CM | POA: Diagnosis not present

## 2017-11-23 DIAGNOSIS — S139XXD Sprain of joints and ligaments of unspecified parts of neck, subsequent encounter: Secondary | ICD-10-CM

## 2017-11-23 DIAGNOSIS — M5030 Other cervical disc degeneration, unspecified cervical region: Secondary | ICD-10-CM

## 2017-11-23 MED ORDER — PREDNISONE 20 MG PO TABS: ORAL_TABLET | ORAL | 0 refills | Status: DC

## 2017-11-23 MED ORDER — BACLOFEN 10 MG PO TABS: 10.0000 mg | ORAL_TABLET | Freq: Three times a day (TID) | ORAL | 3 refills | Status: DC

## 2017-11-23 NOTE — Progress Notes (Signed)
BP (!) 169/91   Pulse 73   Temp 97.6 F (36.4 C) (Oral)   Ht 5\' 11"  (1.803 m)   Wt 194 lb (88 kg)   BMI 27.06 kg/m    Subjective:    Patient ID: Tommy [name], male    DOB: 15-May-1951, 67 y.o.   MRN: [id number]  HPI: Tommy [name] is a 67 y.o. male presenting on 11/23/2017 for Continued neck strain (improved while on Baclofen and Prednisone but returned when medication was finished)   HPI Neck pain, continued Patient comes in today complaining of continued neck pain.  He says when he took the prednisone and the muscle relaxers it helped significantly but came right back after he finished it he says the neck pain so goes down the right side of his neck into the top of his shoulder.  He says it is moderate to severe at times.  He says sometimes it will be severe enough that it would keep him up at night.  He says it is moderate in intensity today here in the office.  He said the prednisone and the muscle relaxer worked great but then he ran out of them and that is why he is back here today.  He denies any fevers or chills or numbness or weakness.  He denies any pain radiating anywhere else.  He says pain will be worse with rotation of the neck.  He has been having this issue for a total of 5 weeks now.  Relevant past medical, surgical, family and social history reviewed and updated as indicated. Interim medical history since our last visit reviewed. Allergies and medications reviewed and updated.  Review of Systems  Constitutional: Negative for chills and fever.  Respiratory: Negative for shortness of breath and wheezing.   Cardiovascular: Negative for chest pain and leg swelling.  Musculoskeletal: Positive for neck pain. Negative for back pain and gait problem.  Skin: Negative for color change and rash.  Neurological: Negative for dizziness, weakness, numbness and headaches.  All other systems reviewed and are negative.   Per HPI unless specifically indicated above   Allergies as of  11/23/2017      Reactions   Penicillins Swelling   Has patient had a PCN reaction causing immediate rash, facial/tongue/throat swelling, SOB or lightheadedness with hypotension: No Has patient had a PCN reaction causing severe rash involving mucus membranes or skin necrosis: No Has patient had a PCN reaction that required hospitalization No Has patient had a PCN reaction occurring within the last 10 years: No If all of the above answers are "NO", then may proceed with Cephalosporin use.      Medication List        Accurate as of 11/23/17  2:51 PM. Always use your most recent med list.          amLODipine 10 MG tablet Commonly known as:  NORVASC Take 1 tablet (10 mg total) by mouth daily.   baclofen 10 MG tablet Commonly known as:  LIORESAL Take 1 tablet (10 mg total) by mouth 3 (three) times daily.   clopidogrel 75 MG tablet Commonly known as:  PLAVIX Take 1 tablet (75 mg total) daily by mouth.   esomeprazole 20 MG capsule Commonly known as:  NEXIUM Take 1 capsule (20 mg total) by mouth daily at 12 noon.   fenofibrate 145 MG tablet Commonly known as:  TRICOR TAKE (1) TABLET DAILY IN THE MORNING.   hydrochlorothiazide 25 MG tablet Commonly known as:  HYDRODIURIL Take 1 tablet (25 mg total) daily by mouth.   ibuprofen 400 MG tablet Commonly known as:  ADVIL,MOTRIN Take 400 mg by mouth every 6 (six) hours as needed.   predniSONE 20 MG tablet Commonly known as:  DELTASONE Take 3 tabs daily for 1 week, then 2 tabs daily for week 2, then 1 tab daily for week 3.   rosuvastatin 10 MG tablet Commonly known as:  CRESTOR Take 1 tablet (10 mg total) every evening by mouth.   tamsulosin 0.4 MG Caps capsule Commonly known as:  FLOMAX Take 1 capsule (0.4 mg total) by mouth daily after supper.          Objective:    BP (!) 169/91   Pulse 73   Temp 97.6 F (36.4 C) (Oral)   Ht 5\' 11"  (1.803 m)   Wt 194 lb (88 kg)   BMI 27.06 kg/m   Wt Readings from Last 3  Encounters:  11/23/17 194 lb (88 kg)  10/25/17 192 lb (87.1 kg)  10/17/17 192 lb (87.1 kg)    Physical Exam  Constitutional: He is oriented to person, place, and time. He appears well-developed and well-nourished. No distress.  Eyes: Conjunctivae are normal. No scleral icterus.  Musculoskeletal: Normal range of motion.       Cervical back: He exhibits tenderness. He exhibits normal range of motion, no bony tenderness and no deformity.       Back:  Neurological: He is alert and oriented to person, place, and time. Coordination normal.  Skin: Skin is warm and dry. No rash noted. He is not diaphoretic.  Psychiatric: He has a normal mood and affect. His behavior is normal.  Nursing note and vitals reviewed.       Assessment & Plan:   Problem List Items Addressed This Visit    None    Visit Diagnoses    Cervical pain (neck)    -  Primary   Relevant Medications   predniSONE (DELTASONE) 20 MG tablet   baclofen (LIORESAL) 10 MG tablet   Neck sprain, subsequent encounter       Relevant Medications   predniSONE (DELTASONE) 20 MG tablet   baclofen (LIORESAL) 10 MG tablet   Other cervical disc degeneration, unspecified cervical region       Relevant Medications   predniSONE (DELTASONE) 20 MG tablet   baclofen (LIORESAL) 10 MG tablet   Other Relevant Orders   CT CERVICAL SPINE WO CONTRAST       Follow up plan: Return if symptoms worsen or fail to improve.  Counseling provided for all of the vaccine components Orders Placed This Encounter  Procedures  . CT CERVICAL SPINE WO CONTRAST    Caryl Pina, MD [facility] 11/23/2017, 2:51 PM

## 2017-11-25 ENCOUNTER — Other Ambulatory Visit: Payer: Self-pay | Admitting: Family Medicine

## 2017-11-25 DIAGNOSIS — I10 Essential (primary) hypertension: Secondary | ICD-10-CM

## 2017-12-05 ENCOUNTER — Ambulatory Visit (HOSPITAL_COMMUNITY): Payer: BLUE CROSS/BLUE SHIELD

## 2017-12-07 ENCOUNTER — Ambulatory Visit (HOSPITAL_COMMUNITY)
Admission: RE | Admit: 2017-12-07 | Discharge: 2017-12-07 | Disposition: A | Discharge status: Home / Self Care | Payer: BLUE CROSS/BLUE SHIELD | Source: Ambulatory Visit | Attending: Family Medicine | Admitting: Family Medicine

## 2017-12-07 DIAGNOSIS — M5030 Other cervical disc degeneration, unspecified cervical region: Secondary | ICD-10-CM | POA: Insufficient documentation

## 2017-12-25 ENCOUNTER — Encounter: Payer: Self-pay | Admitting: Family Medicine

## 2017-12-25 ENCOUNTER — Ambulatory Visit: Payer: BLUE CROSS/BLUE SHIELD | Admitting: Family Medicine

## 2017-12-25 VITALS — BP 136/80 | HR 89 | Temp 97.4°F | Ht 71.0 in | Wt 192.0 lb

## 2017-12-25 DIAGNOSIS — R42 Dizziness and giddiness: Secondary | ICD-10-CM | POA: Diagnosis not present

## 2017-12-25 DIAGNOSIS — I10 Essential (primary) hypertension: Secondary | ICD-10-CM | POA: Diagnosis not present

## 2017-12-25 DIAGNOSIS — R739 Hyperglycemia, unspecified: Secondary | ICD-10-CM | POA: Diagnosis not present

## 2017-12-25 LAB — CBC WITH DIFFERENTIAL/PLATELET
BASOS: 0 %
Basophils Absolute: 0 10*3/uL (ref 0.0–0.2)
EOS (ABSOLUTE): 0.1 10*3/uL (ref 0.0–0.4)
EOS: 1 %
HEMATOCRIT: 37.4 % — AB (ref 37.5–51.0)
Hemoglobin: 13.2 g/dL (ref 13.0–17.7)
IMMATURE GRANULOCYTES: 2 %
Immature Grans (Abs): 0.1 10*3/uL (ref 0.0–0.1)
LYMPHS ABS: 1 10*3/uL (ref 0.7–3.1)
Lymphs: 26 %
MCH: 29.6 pg (ref 26.6–33.0)
MCHC: 35.3 g/dL (ref 31.5–35.7)
MCV: 84 fL (ref 79–97)
MONOS ABS: 0.3 10*3/uL (ref 0.1–0.9)
Monocytes: 6 %
Neutrophils Absolute: 2.5 10*3/uL (ref 1.4–7.0)
Neutrophils: 65 %
Platelets: 218 10*3/uL (ref 150–379)
RBC: 4.46 x10E6/uL (ref 4.14–5.80)
RDW: 13.2 % (ref 12.3–15.4)
WBC: 3.9 10*3/uL (ref 3.4–10.8)

## 2017-12-25 LAB — CMP14+EGFR
A/G RATIO: 2 (ref 1.2–2.2)
ALBUMIN: 4.5 g/dL (ref 3.6–4.8)
ALT: 31 IU/L (ref 0–44)
AST: 18 IU/L (ref 0–40)
Alkaline Phosphatase: 38 IU/L — ABNORMAL LOW (ref 39–117)
BUN / CREAT RATIO: 17 (ref 10–24)
BUN: 21 mg/dL (ref 8–27)
Bilirubin Total: 1 mg/dL (ref 0.0–1.2)
CALCIUM: 10.7 mg/dL — AB (ref 8.6–10.2)
CO2: 25 mmol/L (ref 20–29)
CREATININE: 1.26 mg/dL (ref 0.76–1.27)
Chloride: 105 mmol/L (ref 96–106)
GFR calc Af Amer: 67 mL/min/{1.73_m2} (ref >59)
GFR, EST NON AFRICAN AMERICAN: 58 mL/min/{1.73_m2} — AB (ref >59)
GLOBULIN, TOTAL: 2.3 g/dL (ref 1.5–4.5)
Glucose: 171 mg/dL — ABNORMAL HIGH (ref 65–99)
Potassium: 5.4 mmol/L — ABNORMAL HIGH (ref 3.5–5.2)
SODIUM: 146 mmol/L — AB (ref 134–144)
Total Protein: 6.8 g/dL (ref 6.0–8.5)

## 2017-12-25 LAB — BAYER DCA HB A1C WAIVED: HB A1C (BAYER DCA - WAIVED): 6.2 % (ref <7.0)

## 2017-12-25 NOTE — Progress Notes (Signed)
BP 136/80   Pulse 89   Temp (!) 97.4 F (36.3 C) (Oral)   Ht '5\' 11"'$  (1.803 m)   Wt 192 lb (87.1 kg)   BMI 26.78 kg/m    Subjective:    Patient ID: Tommy [name], male    DOB: 1950-12-07, 67 y.o.   MRN: [id number]  HPI: Tommy [name] is a 67 y.o. male presenting on 12/25/2017 for Blood pressure and blood sugar has been elevated (has noticied visual changes, lightheadedness recently; fyi, finished Prednisone about a week ago)   HPI Blood pressure elevated Patient is coming in today because blood pressures been elevated as well as blood sugar recently.  Patient was on prednisone and finished about a week ago and noticed that his blood pressure and blood sugar have both been up over the past few weeks.  He says his blood sugar has been as high as 200s and his blood pressures been as high as 200 as well.  He says he is also had some occasional visual disturbance and abnormal or "funny feeling in his head".  He sometimes feels lightheaded and this is been going on intermittently over the past week.  He denies any fevers or chills, he does have a little bit of cough but that is his usual cough and denies anything more significant than his usual cough.  He denies any chest pain or palpitations or shortness of breath or wheezing.  He denies any focal numbness or weakness.  He has been off the prednisone for a week and does not feel like it is completely resolving but has improved slightly.  Relevant past medical, surgical, family and social history reviewed and updated as indicated. Interim medical history since our last visit reviewed. Allergies and medications reviewed and updated.  Review of Systems  Constitutional: Negative for chills and fever.  Respiratory: Negative for shortness of breath and wheezing.   Cardiovascular: Negative for chest pain and leg swelling.  Musculoskeletal: Negative for back pain and gait problem.  Skin: Negative for rash.  Neurological: Positive for dizziness and  light-headedness.  Psychiatric/Behavioral: Negative for dysphoric mood, self-injury, sleep disturbance and suicidal ideas. The patient is not nervous/anxious.   All other systems reviewed and are negative.   Per HPI unless specifically indicated above   Allergies as of 12/25/2017      Reactions   Penicillins Swelling   Has patient had a PCN reaction causing immediate rash, facial/tongue/throat swelling, SOB or lightheadedness with hypotension: No Has patient had a PCN reaction causing severe rash involving mucus membranes or skin necrosis: No Has patient had a PCN reaction that required hospitalization No Has patient had a PCN reaction occurring within the last 10 years: No If all of the above answers are "NO", then may proceed with Cephalosporin use.      Medication List        Accurate as of 12/25/17 12:00 PM. Always use your most recent med list.          amLODipine 10 MG tablet Commonly known as:  NORVASC Take 1 tablet (10 mg total) by mouth daily.   baclofen 10 MG tablet Commonly known as:  LIORESAL Take 1 tablet (10 mg total) by mouth 3 (three) times daily.   clopidogrel 75 MG tablet Commonly known as:  PLAVIX Take 1 tablet (75 mg total) daily by mouth.   esomeprazole 20 MG capsule Commonly known as:  NEXIUM Take 1 capsule (20 mg total) by mouth daily at 12 noon.  fenofibrate 145 MG tablet Commonly known as:  TRICOR TAKE (1) TABLET DAILY IN THE MORNING.   hydrochlorothiazide 25 MG tablet Commonly known as:  HYDRODIURIL Take 1 tablet (25 mg total) daily by mouth.   ibuprofen 400 MG tablet Commonly known as:  ADVIL,MOTRIN Take 400 mg by mouth every 6 (six) hours as needed.   rosuvastatin 10 MG tablet Commonly known as:  CRESTOR Take 1 tablet (10 mg total) every evening by mouth.   tamsulosin 0.4 MG Caps capsule Commonly known as:  FLOMAX Take 1 capsule (0.4 mg total) by mouth daily after supper.          Objective:    BP 136/80   Pulse 89    Temp (!) 97.4 F (36.3 C) (Oral)   Ht '5\' 11"'$  (1.803 m)   Wt 192 lb (87.1 kg)   BMI 26.78 kg/m   Wt Readings from Last 3 Encounters:  12/25/17 192 lb (87.1 kg)  11/23/17 194 lb (88 kg)  10/25/17 192 lb (87.1 kg)    Physical Exam  Constitutional: He is oriented to person, place, and time. He appears well-developed and well-nourished. No distress.  Eyes: Conjunctivae are normal. No scleral icterus.  Neck: Neck supple. No thyromegaly present.  Cardiovascular: Normal rate, regular rhythm, normal heart sounds and intact distal pulses.  No murmur heard. Pulmonary/Chest: Effort normal and breath sounds normal. No respiratory distress. He has no wheezes. He has no rales.  Musculoskeletal: Normal range of motion. He exhibits no edema.  Lymphadenopathy:    He has no cervical adenopathy.  Neurological: He is alert and oriented to person, place, and time. Coordination normal.  Skin: Skin is warm and dry. No rash noted. He is not diaphoretic.  Psychiatric: He has a normal mood and affect. His behavior is normal.  Nursing note and vitals reviewed.     Assessment & Plan:   Problem List Items Addressed This Visit      Cardiovascular and Mediastinum   HTN (hypertension) - Primary   Relevant Orders   Bayer DCA Hb A1c Waived   CMP14+EGFR   CBC with Differential/Platelet    Other Visit Diagnoses    Elevated blood sugar       Relevant Orders   Bayer DCA Hb A1c Waived   CMP14+EGFR   CBC with Differential/Platelet   Lightheadedness       Relevant Orders   Bayer DCA Hb A1c Waived   CMP14+EGFR   CBC with Differential/Platelet       Follow up plan: Return in about 2 weeks (around 01/08/2018), or if symptoms worsen or fail to improve, for Follow-up in 2 weeks high blood pressure and sugar either by phone or at a visit.  Counseling provided for all of the vaccine components Orders Placed This Encounter  Procedures  . Bayer DCA Hb A1c Waived  . CMP14+EGFR  . CBC with Differential/Platelet     Caryl Pina, MD [facility] 12/25/2017, 12:00 PM

## 2017-12-29 ENCOUNTER — Other Ambulatory Visit: Payer: Self-pay | Admitting: Family Medicine

## 2018-01-22 ENCOUNTER — Ambulatory Visit: Payer: BLUE CROSS/BLUE SHIELD | Admitting: Family Medicine

## 2018-01-22 ENCOUNTER — Encounter: Payer: Self-pay | Admitting: Family Medicine

## 2018-01-22 VITALS — BP 134/78 | HR 74 | Temp 98.0°F | Ht 71.0 in | Wt 197.0 lb

## 2018-01-22 DIAGNOSIS — M542 Cervicalgia: Secondary | ICD-10-CM | POA: Diagnosis not present

## 2018-01-22 DIAGNOSIS — H6123 Impacted cerumen, bilateral: Secondary | ICD-10-CM

## 2018-01-22 DIAGNOSIS — Z23 Encounter for immunization: Secondary | ICD-10-CM | POA: Diagnosis not present

## 2018-01-22 NOTE — Progress Notes (Signed)
BP 134/78   Pulse 74   Temp 98 F (36.7 C) (Oral)   Ht 5\' 11"  (1.803 m)   Wt 197 lb (89.4 kg)   BMI 27.48 kg/m    Subjective:    Patient ID: Tommy [name], male    DOB: [birth year], 67 y.o.   MRN: [id number]  HPI: JEDI CATALFAMO is a 67 y.o. male presenting on 01/22/2018 for Worsening pain in right side of neck/right ear when he is ea (wants to make sure ear is not infected) and Wants referral for PT for neck   HPI Neck pain Patient has had continued neck pain, it is improving.  He had a CT of his neck which showed degeneration but no impingement.  This was in February of this year.  He says the neck pain is mostly located on his right side through the muscles of his neck extending down to the muscles of the shoulder.  He denies any numbness or weakness or radiation anywhere else.  He has been using baclofen 3 times a day and it is helping and he feels like things are improving but not fully resolving.  He is coming in today to see what he could do next.  Hearing loss due to wax in both ears Patient feels like he has a lot of wax in both of his ears and feels like his ears are muffled and congestion and is not hearing as well and would like to get them cleaned.  He was able to get some wax out the other day but he feels like he has a lot in them still.  Relevant past medical, surgical, family and social history reviewed and updated as indicated. Interim medical history since our last visit reviewed. Allergies and medications reviewed and updated.  Review of Systems  Constitutional: Negative for chills and fever.  HENT: Positive for hearing loss.   Eyes: Negative for discharge.  Respiratory: Negative for shortness of breath and wheezing.   Cardiovascular: Negative for chest pain and leg swelling.  Musculoskeletal: Positive for neck pain. Negative for back pain and gait problem.  Skin: Negative for rash.  All other systems reviewed and are negative.   Per HPI unless specifically  indicated above   Allergies as of 01/22/2018      Reactions   Penicillins Swelling   Has patient had a PCN reaction causing immediate rash, facial/tongue/throat swelling, SOB or lightheadedness with hypotension: No Has patient had a PCN reaction causing severe rash involving mucus membranes or skin necrosis: No Has patient had a PCN reaction that required hospitalization No Has patient had a PCN reaction occurring within the last 10 years: No If all of the above answers are "NO", then may proceed with Cephalosporin use.      Medication List        Accurate as of 01/22/18  9:59 AM. Always use your most recent med list.          amLODipine 10 MG tablet Commonly known as:  NORVASC Take 1 tablet (10 mg total) by mouth daily.   baclofen 10 MG tablet Commonly known as:  LIORESAL Take 1 tablet (10 mg total) by mouth 3 (three) times daily.   clopidogrel 75 MG tablet Commonly known as:  PLAVIX Take 1 tablet (75 mg total) daily by mouth.   esomeprazole 20 MG capsule Commonly known as:  NEXIUM Take 1 capsule (20 mg total) by mouth daily at 12 noon.   fenofibrate 145 MG tablet Commonly  known as:  TRICOR TAKE (1) TABLET DAILY IN THE MORNING.   hydrochlorothiazide 25 MG tablet Commonly known as:  HYDRODIURIL Take 1 tablet (25 mg total) daily by mouth.   ibuprofen 400 MG tablet Commonly known as:  ADVIL,MOTRIN Take 400 mg by mouth every 6 (six) hours as needed.   rosuvastatin 10 MG tablet Commonly known as:  CRESTOR Take 1 tablet (10 mg total) every evening by mouth.   tamsulosin 0.4 MG Caps capsule Commonly known as:  FLOMAX Take 1 capsule (0.4 mg total) by mouth daily after supper.          Objective:    BP 134/78   Pulse 74   Temp 98 F (36.7 C) (Oral)   Ht 5\' 11"  (1.803 m)   Wt 197 lb (89.4 kg)   BMI 27.48 kg/m   Wt Readings from Last 3 Encounters:  01/22/18 197 lb (89.4 kg)  12/25/17 192 lb (87.1 kg)  11/23/17 194 lb (88 kg)    Physical Exam    Constitutional: He is oriented to person, place, and time. He appears well-developed and well-nourished. No distress.  HENT:  Cerumen impaction bilaterally  Eyes: Conjunctivae are normal. Right eye exhibits no discharge. No scleral icterus.  Neck: Neck supple. No thyromegaly present.  Musculoskeletal: Normal range of motion. He exhibits tenderness (Right neck muscular tenderness extending down to the top of the shoulder.). He exhibits no edema.  Lymphadenopathy:    He has no cervical adenopathy.  Neurological: He is alert and oriented to person, place, and time. Coordination normal.  Skin: Skin is warm and dry. No rash noted. He is not diaphoretic.  Psychiatric: He has a normal mood and affect. His behavior is normal.  Nursing note and vitals reviewed.   Nursing lavage ears, patient tolerated well    Assessment & Plan:   Problem List Items Addressed This Visit    None    Visit Diagnoses    Cervical pain (neck)    -  Primary   Relevant Orders   Ambulatory referral to Physical Therapy   Bilateral impacted cerumen           Follow up plan: Return if symptoms worsen or fail to improve.  Counseling provided for all of the vaccine components Orders Placed This Encounter  Procedures  . Ambulatory referral to Physical Therapy    Caryl Pina, MD [facility] 01/22/2018, 9:59 AM

## 2018-02-01 ENCOUNTER — Other Ambulatory Visit: Payer: Self-pay | Admitting: Family Medicine

## 2018-03-06 ENCOUNTER — Other Ambulatory Visit: Payer: Self-pay | Admitting: Family

## 2018-03-06 NOTE — Telephone Encounter (Signed)
Last lipid 08/25/17

## 2018-04-10 ENCOUNTER — Other Ambulatory Visit: Payer: Self-pay | Admitting: Family Medicine

## 2018-05-09 ENCOUNTER — Ambulatory Visit: Payer: BLUE CROSS/BLUE SHIELD | Admitting: Family

## 2018-05-09 ENCOUNTER — Encounter: Payer: Self-pay | Admitting: Family

## 2018-05-09 VITALS — BP 149/89 | HR 61 | Temp 97.6°F | Ht 71.0 in | Wt 193.6 lb

## 2018-05-09 DIAGNOSIS — E663 Overweight: Secondary | ICD-10-CM

## 2018-05-09 DIAGNOSIS — M47812 Spondylosis without myelopathy or radiculopathy, cervical region: Secondary | ICD-10-CM | POA: Diagnosis not present

## 2018-05-09 DIAGNOSIS — R609 Edema, unspecified: Secondary | ICD-10-CM

## 2018-05-09 DIAGNOSIS — I10 Essential (primary) hypertension: Secondary | ICD-10-CM

## 2018-05-09 LAB — BMP8+EGFR
BUN/Creatinine Ratio: 18 (ref 10–24)
BUN: 20 mg/dL (ref 8–27)
CALCIUM: 10.7 mg/dL — AB (ref 8.6–10.2)
CO2: 26 mmol/L (ref 20–29)
CREATININE: 1.11 mg/dL (ref 0.76–1.27)
Chloride: 102 mmol/L (ref 96–106)
GFR calc non Af Amer: 67 mL/min/{1.73_m2} (ref >59)
GFR, EST AFRICAN AMERICAN: 78 mL/min/{1.73_m2} (ref >59)
GLUCOSE: 95 mg/dL (ref 65–99)
Potassium: 4.6 mmol/L (ref 3.5–5.2)
SODIUM: 144 mmol/L (ref 134–144)

## 2018-05-09 NOTE — Progress Notes (Signed)
   Subjective:    Patient ID: Tommy [name], male    DOB: [birth year], 67 y.o.   MRN: [id number]  Chief Complaint  Patient presents with  . Foot Swelling    Hypertension  This is a chronic problem. The current episode started more than 1 year ago. The problem has been waxing and waning since onset. The problem is uncontrolled. Associated symptoms include neck pain and peripheral edema. Pertinent negatives include no headaches. There is no history of kidney disease, CAD/MI, CVA or heart failure.  Neck Pain   This is a recurrent problem. The current episode started more than 1 month ago. The problem occurs constantly. The problem has been waxing and waning. The pain is at a severity of 9/10. The pain is moderate. Stiffness is present at night. Pertinent negatives include no headaches. He has tried NSAIDs for the symptoms. The treatment provided mild relief.  Peripheral Edema PT states he has had bilateral feet swelling over the last few weeks. States it becomes better when he wears compression hose and elevate his legs. States he worked 16 days straight about two weeks ago. Pt is on Norvasc 10 mg but has been on since 10/2016.   Review of Systems  Cardiovascular: Positive for leg swelling.  Musculoskeletal: Positive for neck pain.  Neurological: Negative for headaches.  All other systems reviewed and are negative.      Objective:   Physical Exam  Constitutional: He is oriented to person, place, and time. He appears well-developed and well-nourished. No distress.  HENT:  Head: Normocephalic.  Right Ear: External ear normal.  Left Ear: External ear normal.  Mouth/Throat: Oropharynx is clear and moist.  Eyes: Pupils are equal, round, and reactive to light. Right eye exhibits no discharge. Left eye exhibits no discharge.  Neck: Normal range of motion. Neck supple. No thyromegaly present.  Cardiovascular: Normal rate, regular rhythm, normal heart sounds and intact distal pulses.  No murmur  heard. Pulmonary/Chest: Effort normal and breath sounds normal. No respiratory distress. He has no wheezes.  Abdominal: Soft. Bowel sounds are normal. He exhibits no distension. There is no tenderness.  Musculoskeletal: Normal range of motion. He exhibits no edema or tenderness.  Neurological: He is alert and oriented to person, place, and time. He has normal reflexes. No cranial nerve deficit.  Skin: Skin is warm and dry. No rash noted. No erythema.  Psychiatric: He has a normal mood and affect. His behavior is normal. Judgment and thought content normal.  Vitals reviewed.     BP (!) 149/89   Pulse 61   Temp 97.6 F (36.4 C) (Oral)   Ht '5\' 11"'$  (1.803 m)   Wt 193 lb 9.6 oz (87.8 kg)   BMI 27.00 kg/m      Assessment & Plan:  POLK MINOR comes in today with chief complaint of Foot Swelling   Diagnosis and orders addressed:  1. Essential hypertension -We will continue BP medications, last two visits with PCP BP has been normal -Dash diet information given -Exercise encouraged - Stress Management  -Continue current meds - BMP8+EGFR  2. Overweight (BMI 25.0-29.9) - BMP8+EGFR  3. Peripheral edema Continue compression hose Low salt diet  Keep feet elevated  4. Osteoarthritis of cervical spine, unspecified spinal osteoarthritis complication status - Ambulatory referral to Orthopedic Surgery   Labs pending Diet and exercise encouraged  Follow up plan: Keep follow up with PCP  Evelina Dun, FNP

## 2018-05-09 NOTE — Patient Instructions (Signed)

## 2018-05-15 ENCOUNTER — Other Ambulatory Visit: Payer: Self-pay | Admitting: Family Medicine

## 2018-06-18 ENCOUNTER — Other Ambulatory Visit: Payer: Self-pay | Admitting: Family Medicine

## 2018-07-05 ENCOUNTER — Ambulatory Visit: Payer: BLUE CROSS/BLUE SHIELD | Admitting: Family Medicine

## 2018-07-05 ENCOUNTER — Encounter: Payer: Self-pay | Admitting: Family Medicine

## 2018-07-05 VITALS — BP 129/81 | HR 88 | Temp 97.9°F | Ht 71.0 in | Wt 190.8 lb

## 2018-07-05 DIAGNOSIS — R0989 Other specified symptoms and signs involving the circulatory and respiratory systems: Secondary | ICD-10-CM

## 2018-07-05 DIAGNOSIS — F458 Other somatoform disorders: Secondary | ICD-10-CM

## 2018-07-05 DIAGNOSIS — R7303 Prediabetes: Secondary | ICD-10-CM

## 2018-07-05 DIAGNOSIS — Z23 Encounter for immunization: Secondary | ICD-10-CM

## 2018-07-05 DIAGNOSIS — I10 Essential (primary) hypertension: Secondary | ICD-10-CM

## 2018-07-05 DIAGNOSIS — E785 Hyperlipidemia, unspecified: Secondary | ICD-10-CM

## 2018-07-05 DIAGNOSIS — E663 Overweight: Secondary | ICD-10-CM

## 2018-07-05 LAB — BAYER DCA HB A1C WAIVED: HB A1C (BAYER DCA - WAIVED): 5.2 % (ref <7.0)

## 2018-07-05 MED ORDER — FENOFIBRATE 145 MG PO TABS: 145.0000 mg | ORAL_TABLET | Freq: Every day | ORAL | 3 refills | Status: DC

## 2018-07-05 MED ORDER — AMLODIPINE BESYLATE 10 MG PO TABS: 10.0000 mg | ORAL_TABLET | Freq: Every day | ORAL | 3 refills | Status: DC

## 2018-07-05 MED ORDER — ROSUVASTATIN CALCIUM 10 MG PO TABS: 10.0000 mg | ORAL_TABLET | Freq: Every evening | ORAL | 3 refills | Status: DC

## 2018-07-05 MED ORDER — HYDROCHLOROTHIAZIDE 25 MG PO TABS: 25.0000 mg | ORAL_TABLET | Freq: Every day | ORAL | 3 refills | Status: DC

## 2018-07-05 MED ORDER — CLOPIDOGREL BISULFATE 75 MG PO TABS: 75.0000 mg | ORAL_TABLET | Freq: Every day | ORAL | 3 refills | Status: DC

## 2018-07-05 MED ORDER — TAMSULOSIN HCL 0.4 MG PO CAPS: ORAL_CAPSULE | ORAL | 3 refills | Status: DC

## 2018-07-05 NOTE — Progress Notes (Signed)
BP 129/81   Pulse 88   Temp 97.9 F (36.6 C) (Oral)   Ht 5\' 11"  (1.803 m)   Wt 190 lb 12.8 oz (86.5 kg)   BMI 26.61 kg/m    Subjective:    Patient ID: KIET GEER, male    DOB: August 24, 1951, 67 y.o.   MRN: [id number]  HPI: THARON BOMAR is a 67 y.o. male presenting on 07/05/2018 for Hypertension (2 month check up of chronic medical conditions); Hyperlipidemia; and Neck Pain (Cervical neck pain . Has been going on for 5 months and patient states it is no better.)   HPI Hypertension Patient is currently on amlodipine and hydrochlorothiazide, and their blood pressure today is 129/81. Patient denies any lightheadedness or dizziness. Patient denies headaches, blurred vision, chest pains, shortness of breath, or weakness. Denies any side effects from medication and is content with current medication.   Hyperlipidemia Patient is coming in for recheck of his hyperlipidemia. The patient is currently taking fenofibrate and Crestor. They deny any issues with myalgias or history of liver damage from it. They deny any focal numbness or weakness or chest pain.   GERD Patient is currently on Nexium.  She denies any major symptoms or abdominal pain or belching or burping. She denies any blood in her stool or lightheadedness or dizziness.  He has a globus sensation has been much improved while taking the Nexium as needed now.  Prediabetes Patient comes in today for recheck of his diabetes. Patient has been currently taking no medication and we are just monitoring, his last A1c was 6.2 and will recheck today. Patient is not currently on an ACE inhibitor/ARB. Patient has not seen an ophthalmologist this year. Patient denies any issues with their feet.   Relevant past medical, surgical, family and social history reviewed and updated as indicated. Interim medical history since our last visit reviewed. Allergies and medications reviewed and updated.  Review of Systems  Constitutional: Negative for chills  and fever.  Eyes: Negative for visual disturbance.  Respiratory: Negative for shortness of breath and wheezing.   Cardiovascular: Negative for chest pain and leg swelling.  Musculoskeletal: Negative for back pain and gait problem.  Skin: Negative for rash.  Neurological: Negative for dizziness, weakness and numbness.  All other systems reviewed and are negative.   Per HPI unless specifically indicated above   Allergies as of 07/05/2018      Reactions   Penicillins Swelling   Has patient had a PCN reaction causing immediate rash, facial/tongue/throat swelling, SOB or lightheadedness with hypotension: No Has patient had a PCN reaction causing severe rash involving mucus membranes or skin necrosis: No Has patient had a PCN reaction that required hospitalization No Has patient had a PCN reaction occurring within the last 10 years: No If all of the above answers are "NO", then may proceed with Cephalosporin use.      Medication List        Accurate as of 07/05/18  9:47 AM. Always use your most recent med list.          amLODipine 10 MG tablet Commonly known as:  NORVASC Take 1 tablet (10 mg total) by mouth daily.   clopidogrel 75 MG tablet Commonly known as:  PLAVIX Take 1 tablet (75 mg total) by mouth daily.   esomeprazole 20 MG capsule Commonly known as:  NEXIUM Take 1 capsule (20 mg total) by mouth daily at 12 noon.   fenofibrate 145 MG tablet Commonly known as:  TRICOR Take 1 tablet (145 mg total) by mouth daily.   hydrochlorothiazide 25 MG tablet Commonly known as:  HYDRODIURIL Take 1 tablet (25 mg total) by mouth daily.   rosuvastatin 10 MG tablet Commonly known as:  CRESTOR Take 1 tablet (10 mg total) by mouth every evening.   tamsulosin 0.4 MG Caps capsule Commonly known as:  FLOMAX Take 1 capsule (0.4 mg total) by mouth daily after supper.          Objective:    BP 129/81   Pulse 88   Temp 97.9 F (36.6 C) (Oral)   Ht 5\' 11"  (1.803 m)   Wt 190  lb 12.8 oz (86.5 kg)   BMI 26.61 kg/m   Wt Readings from Last 3 Encounters:  07/05/18 190 lb 12.8 oz (86.5 kg)  05/09/18 193 lb 9.6 oz (87.8 kg)  01/22/18 197 lb (89.4 kg)    Physical Exam  Constitutional: He is oriented to person, place, and time. He appears well-developed and well-nourished. No distress.  Eyes: Conjunctivae are normal. No scleral icterus.  Neck: Neck supple. No thyromegaly present.  Cardiovascular: Normal rate, regular rhythm, normal heart sounds and intact distal pulses.  No murmur heard. Pulmonary/Chest: Effort normal and breath sounds normal. No respiratory distress. He has no wheezes.  Musculoskeletal: Normal range of motion. He exhibits no edema.       Back:  Lymphadenopathy:    He has no cervical adenopathy.  Neurological: He is alert and oriented to person, place, and time. Coordination normal.  Skin: Skin is warm and dry. No rash noted. He is not diaphoretic.  Psychiatric: He has a normal mood and affect. His behavior is normal.  Nursing note and vitals reviewed.       Assessment & Plan:   Problem List Items Addressed This Visit      Cardiovascular and Mediastinum   HTN (hypertension) - Primary   Relevant Medications   amLODipine (NORVASC) 10 MG tablet   rosuvastatin (CRESTOR) 10 MG tablet   fenofibrate (TRICOR) 145 MG tablet   hydrochlorothiazide (HYDRODIURIL) 25 MG tablet     Other   Hyperlipidemia LDL goal <130   Relevant Medications   amLODipine (NORVASC) 10 MG tablet   rosuvastatin (CRESTOR) 10 MG tablet   fenofibrate (TRICOR) 145 MG tablet   hydrochlorothiazide (HYDRODIURIL) 25 MG tablet   Overweight (BMI 25.0-29.9)   Prediabetes   Relevant Orders   Bayer DCA Hb A1c Waived    Other Visit Diagnoses    Globus sensation          Continue patient's current medication including Nexium and Norvasc and hydrochlorothiazide and fenofibrate and Crestor, will recheck labs today and see how is doing with his A1c.  He just had labs done less  than a couple months ago with everything else.  Discussed dietary and lifestyle changes and educated about what to do to avoid prediabetes and help with overweight. Follow up plan: Return in about 6 months (around 01/03/2019), or if symptoms worsen or fail to improve, for Hypertension and prediabetes recheck.  Counseling provided for all of the vaccine components Orders Placed This Encounter  Procedures  . Bayer [facility] Hb A1c [place], MD [facility] 07/05/2018, 9:47 AM

## 2018-11-02 IMAGING — CT CT CERVICAL SPINE W/O CM
3 series · 13 of 33 positions shown, 16 images · non-contrast
Comparison: Radiographs 10/25/2017, CT cervical spine 08/04/2016

CLINICAL DATA: Posterior neck pain

EXAM:
CT CERVICAL SPINE WITHOUT CONTRAST
TECHNIQUE: Multidetector CT imaging of the cervical spine was performed without
intravenous contrast. Multiplanar CT image reconstructions were also
generated.

[Series 4: c spine soft · axial · 0.46mm/px · z∈[+48,+200]mm · 5 of 110 slices shown, 7 images]
[im 17/110  soft-tissue]
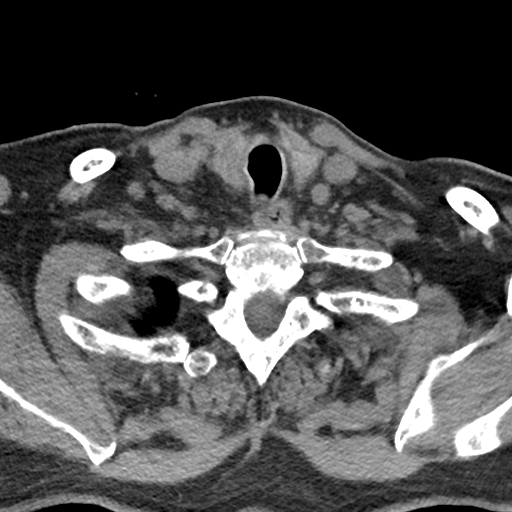
[im 17/110  bone]
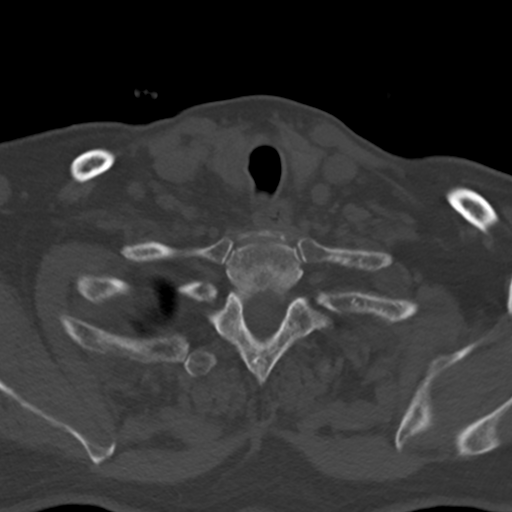
[im 34/110  bone]
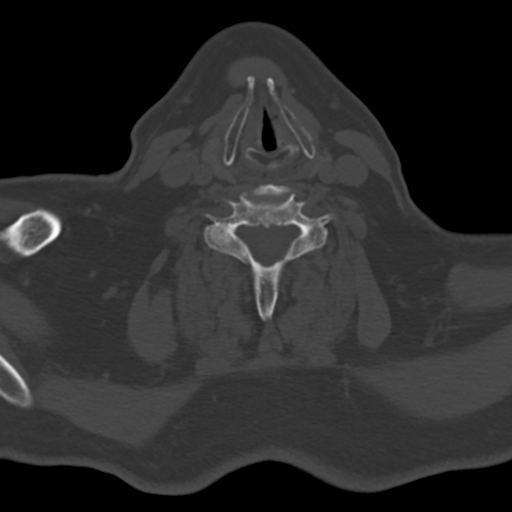
[im 59/110  bone]
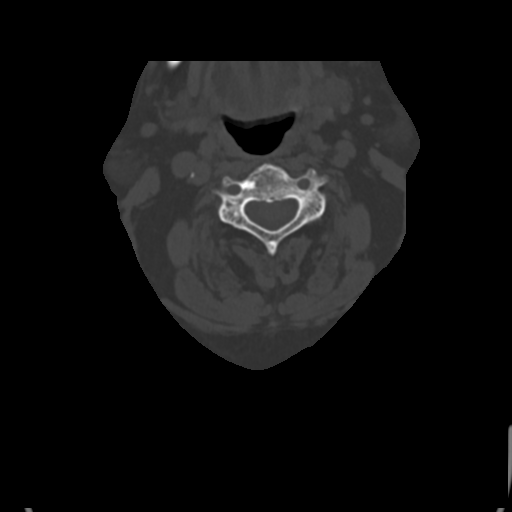
[im 76/110  bone]
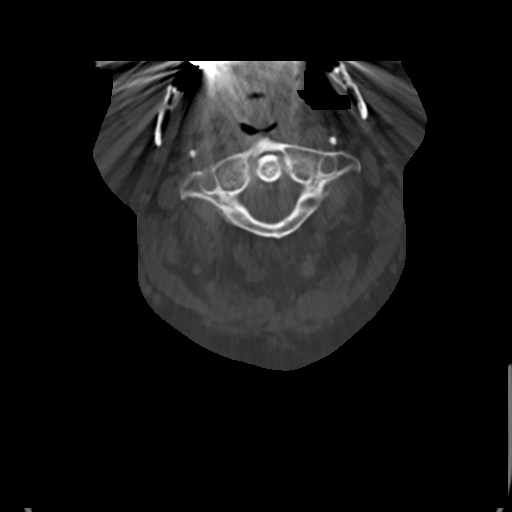
[im 93/110  soft-tissue]
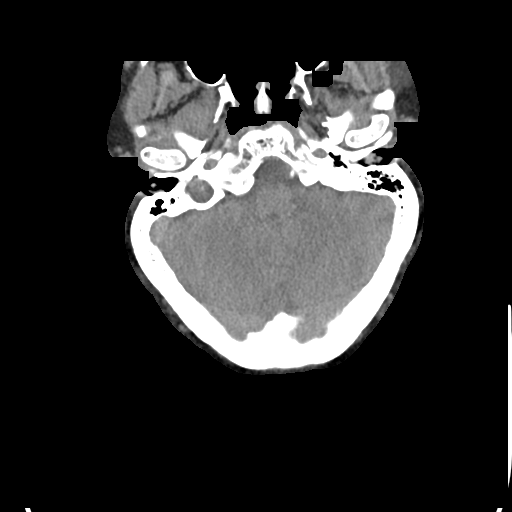
[im 93/110  bone]
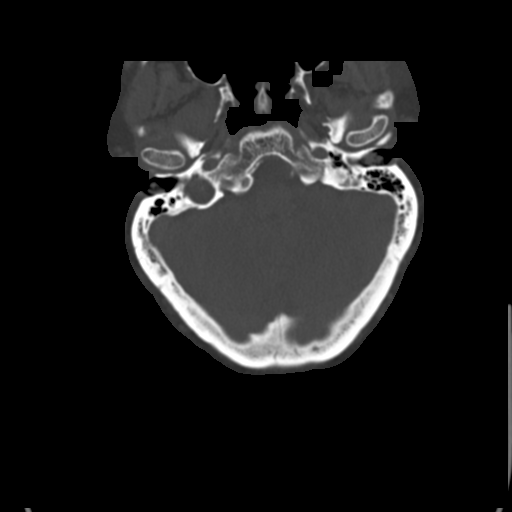

[Series 5: sagittal bone · sagittal · 0.33mm/px · 5 of 61 slices shown, 6 images]
[im 21/61  bone]
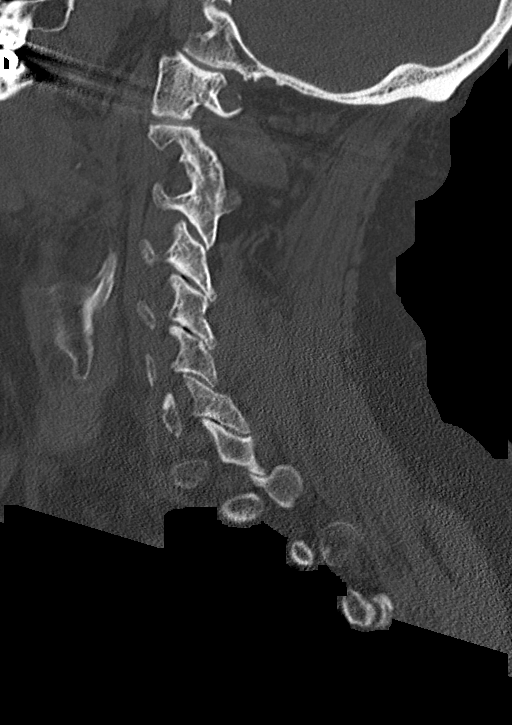
[im 26/61  bone]
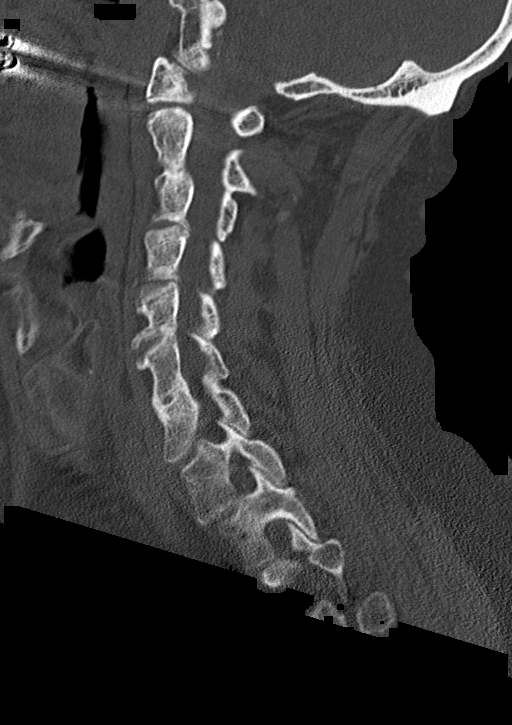
[im 31/61  soft-tissue]
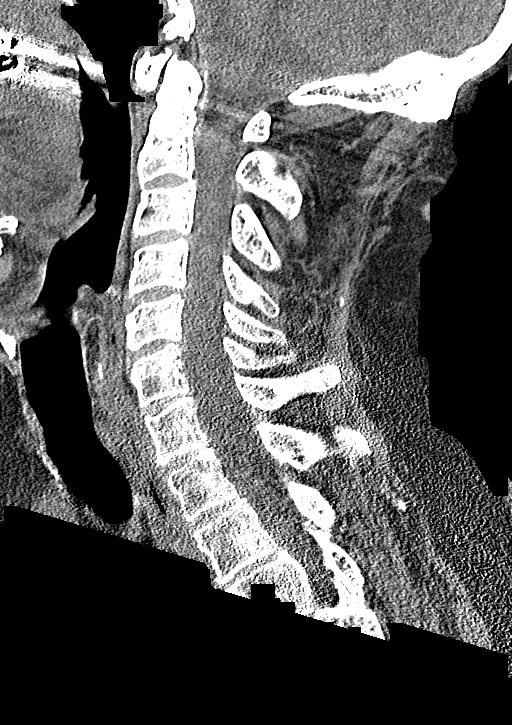
[im 31/61  bone]
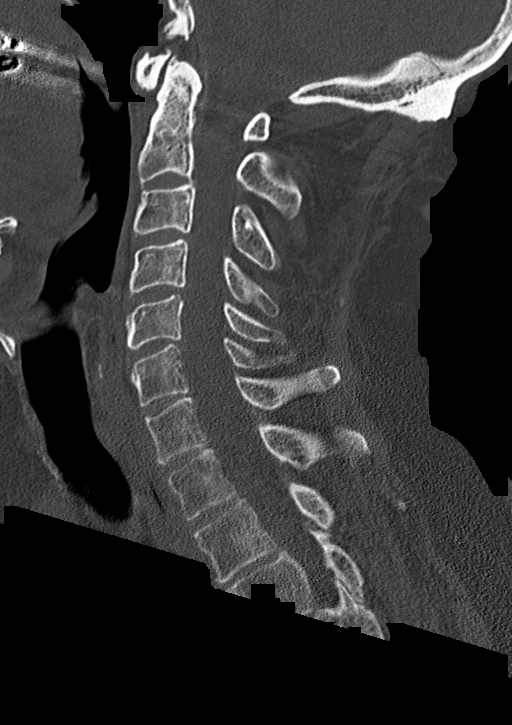
[im 36/61  bone]
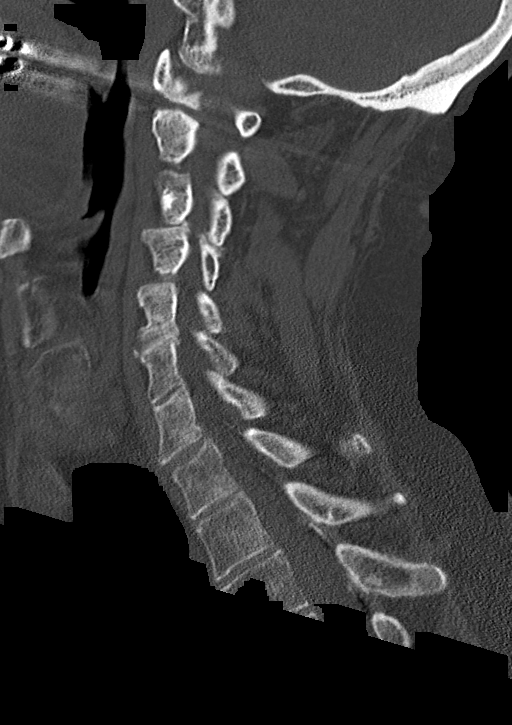
[im 41/61  bone]
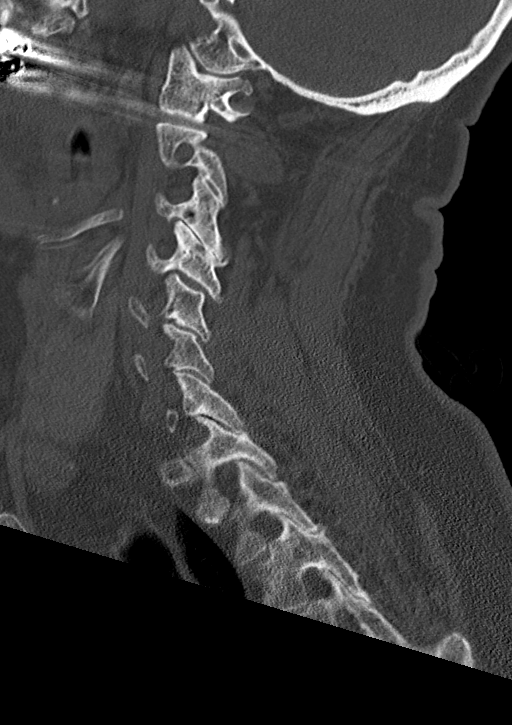

[Series 6: coronal bone · coronal · 0.32mm/px · 3 of 65 slices shown]
[im 13/65  bone]
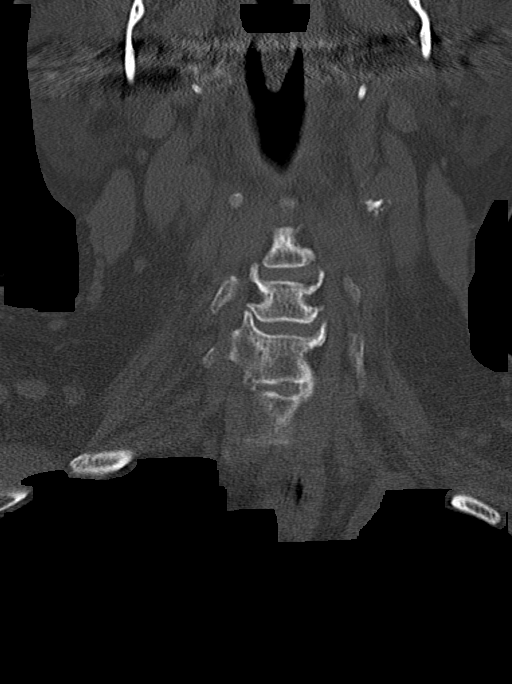
[im 26/65  bone]
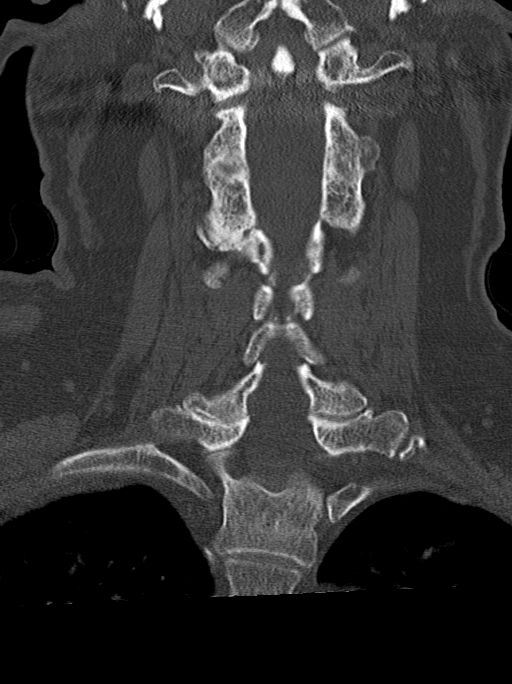
[im 39/65  bone]
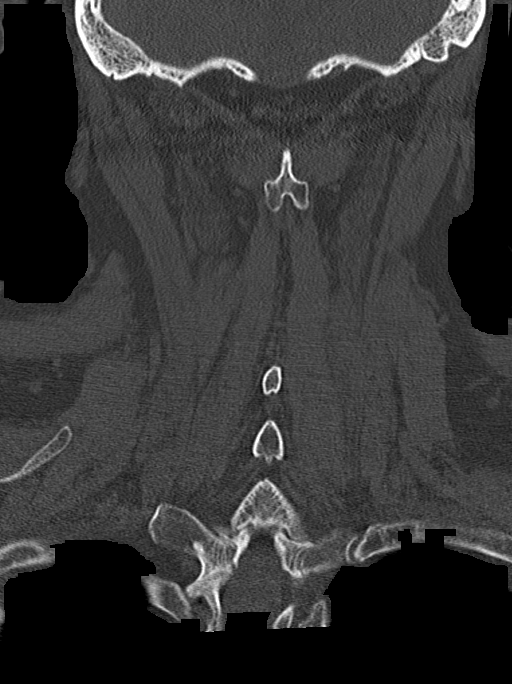

[13 of 33 positions shown; findings below may reference images not displayed]

FINDINGS: Alignment: Within normal limits. Facet alignment is within normal
limits.

Skull base and vertebrae: Craniovertebral junction is intact. No
fracture. Stable sclerotic focus in C3 probable bone island.

Soft tissues and spinal canal: No prevertebral fluid or swelling. No
visible canal hematoma.

Disc levels:

At C2-C3, maintained disc space. No bony canal stenosis. Left
greater than right facet degenerative change. No high-grade
foraminal narrowing.

At C3-C4, maintained disc space. No bony canal stenosis. Right
lateral bony spurring. Right greater than left facet degenerative
change. Mild right foraminal stenosis.

At C4-C5, maintained disc space. No bony canal stenosis. Bilateral
facet degenerative changes.

At C5-C6, minimal disc space narrowing. Bilateral facet degenerative
changes. No bony canal stenosis. Mild to moderate bilateral
foraminal narrowing.

At C6-C7, mild disc space narrowing. No bony canal stenosis. No
high-grade foraminal narrowing.

At C7-T1, no canal stenosis or significant foraminal narrowing.

Upper chest: Negative.

Other: None
IMPRESSION: 1. No acute osseous abnormality of the cervical spine
2. Multilevel mild degenerative disc changes as detailed above
without high-grade bony canal stenosis or bony foraminal
impingement.

## 2019-02-08 ENCOUNTER — Ambulatory Visit: Payer: Managed Care, Other (non HMO) | Admitting: Family

## 2019-02-08 ENCOUNTER — Other Ambulatory Visit: Payer: Self-pay

## 2019-02-08 ENCOUNTER — Encounter: Payer: Self-pay | Admitting: Family

## 2019-02-08 VITALS — BP 147/89 | HR 64 | Temp 97.5°F | Ht 71.0 in | Wt 191.8 lb

## 2019-02-08 DIAGNOSIS — B354 Tinea corporis: Secondary | ICD-10-CM | POA: Diagnosis not present

## 2019-02-08 MED ORDER — BUTENAFINE HCL 1 % EX CREA: 1.0000 "application " | TOPICAL_CREAM | Freq: Two times a day (BID) | CUTANEOUS | 0 refills | Status: DC

## 2019-02-08 NOTE — Progress Notes (Signed)
   Subjective:    Patient ID: Tommy [name], male    DOB: [birth year], 68 y.o.   MRN: [id number]  Rash  This is a new problem. The current episode started 1 to 4 weeks ago. The problem has been gradually worsening since onset. The affected locations include the right lower leg. The rash is characterized by itchiness, dryness and redness. He was exposed to nothing. Pertinent negatives include no congestion, diarrhea, fatigue, joint pain, shortness of breath or sore throat. Past treatments include antibiotic cream. The treatment provided no relief.      Review of Systems  Constitutional: Negative for fatigue.  HENT: Negative for congestion and sore throat.   Respiratory: Negative for shortness of breath.   Gastrointestinal: Negative for diarrhea.  Musculoskeletal: Negative for joint pain.  Skin: Positive for rash.  All other systems reviewed and are negative.      Objective:   Physical Exam Vitals signs reviewed.  Constitutional:      General: He is not in acute distress.    Appearance: He is well-developed.  HENT:     Head: Normocephalic.  Neck:     Musculoskeletal: Normal range of motion and neck supple.     Thyroid: No thyromegaly.  Cardiovascular:     Rate and Rhythm: Normal rate and regular rhythm.     Heart sounds: Normal heart sounds. No murmur.  Pulmonary:     Effort: Pulmonary effort is normal. No respiratory distress.     Breath sounds: Normal breath sounds. No wheezing.  Abdominal:     General: Bowel sounds are normal. There is no distension.     Palpations: Abdomen is soft.     Tenderness: There is no abdominal tenderness.  Musculoskeletal: Normal range of motion.        General: No tenderness.  Skin:    General: Skin is warm and dry.     Findings: Rash present. No erythema.          Comments: Right medial lower leg erythemas rash approx 2.5X3 cm,   Neurological:     Mental Status: He is alert and oriented to person, place, and time.     Cranial Nerves: No  cranial nerve deficit.     Deep Tendon Reflexes: Reflexes are normal and symmetric.  Psychiatric:        Behavior: Behavior normal.        Thought Content: Thought content normal.        Judgment: Judgment normal.      BP (!) 147/89   Pulse 64   Temp (!) 97.5 F (36.4 C) (Oral)   Ht 5\' 11"  (1.803 m)   Wt 191 lb 12.8 oz (87 kg)   BMI 26.75 kg/m       Assessment & Plan:  Tommy [name] comes in today with chief complaint of red,circular, itching patch on right lower leg   Diagnosis and orders addressed:  1. Tinea corporis Do not scratch Good hand hygiene discussed Apply cream for next two weeks RTO as needed  - Butenafine HCl 1 % cream; Apply 1 application topically 2 (two) times daily.  Dispense: 24 g; Refill: 0   Evelina Dun, FNP

## 2019-02-08 NOTE — Patient Instructions (Signed)

## 2019-04-23 ENCOUNTER — Ambulatory Visit (INDEPENDENT_AMBULATORY_CARE_PROVIDER_SITE_OTHER): Payer: Managed Care, Other (non HMO) | Admitting: Family Medicine

## 2019-04-23 ENCOUNTER — Encounter: Payer: Self-pay | Admitting: Family Medicine

## 2019-04-23 DIAGNOSIS — R42 Dizziness and giddiness: Secondary | ICD-10-CM

## 2019-04-23 DIAGNOSIS — R2681 Unsteadiness on feet: Secondary | ICD-10-CM

## 2019-04-23 MED ORDER — MECLIZINE HCL 12.5 MG PO TABS: 12.5000 mg | ORAL_TABLET | Freq: Three times a day (TID) | ORAL | 0 refills | Status: DC | PRN

## 2019-04-23 NOTE — Progress Notes (Signed)
Virtual Visit via telephone Note Due to COVID-19, visit is conducted virtually and was requested by patient. This visit type was conducted due to national recommendations for restrictions regarding the COVID-19 Pandemic (e.g. social distancing) in an effort to limit this patient's exposure and mitigate transmission in our community. All issues noted in this document were discussed and addressed.  A physical exam was not performed with this format.   I connected with Tommy [name] on 04/23/19 at 0850 by telephone and verified that I am speaking with the correct person using two identifiers. Tommy [name] is currently located at work and no one is currently with them during visit. The provider, Monia Pouch, FNP is located in their office at time of visit.  I discussed the limitations, risks, security and privacy concerns of performing an evaluation and management service by telephone and the availability of in person appointments. I also discussed with the patient that there may be a patient responsible charge related to this service. The patient expressed understanding and agreed to proceed.  Subjective:  Patient ID: Tommy [name], male    DOB: 16-Sep-1951, 68 y.o.   MRN: [id number]  Chief Complaint:  Dizziness   HPI: Tommy [name] is a 68 y.o. male presenting on 04/23/2019 for Dizziness   Pt reports dizziness and gait instability. Pt states this started a week ago. States it went away and then returned yesterday. States when he gets up in the morning it is severe and then decreases throughout the day. 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 States he had trouble walking into work when he got there. Pt states he does not have any weakness, confusion, facial droop, slurred speech, or other focal deficits. He does have slight nausea with the dizziness.   Dizziness This is a recurrent problem. The current episode started 1 to 4 weeks ago. The problem occurs intermittently.  The problem has been waxing and waning. Associated symptoms include nausea and vertigo. Pertinent negatives include no abdominal pain, chest pain, chills, fatigue, fever, headaches, numbness, visual change, vomiting or weakness. The symptoms are aggravated by walking, standing and bending. He has tried nothing for the symptoms.     Relevant past medical, surgical, family, and social history reviewed and updated as indicated.  Allergies and medications reviewed and updated.   Past Medical History:  Diagnosis Date  . BPH with obstruction/lower urinary tract symptoms    hyperplasia  . ED (erectile dysfunction)    arterial insuff.  . History of DVT of lower extremity   . History of gastric ulcer   . Hyperlipidemia   . Hypertension   . Peripheral vascular disease ([place])   . Prostate cancer [facility]) DX 01/01/15--  urologist-  dr Jeffie Pollock   Stage T1c, Gleason 3+4, PSA 5.73, vol 30ml  . Wears glasses     Past Surgical History:  Procedure Laterality Date  . FEMORAL ARTERY STENT Left 07-22-2008  &  11-16-2010   left SFA//   In-Stent restenosis  . ORIF LEFT INDEX FINGER FX  09-28-2006  . PROSTATE BIOPSY  01/01/15  . PROSTATE SURGERY  July 2016   Seed Implant  . RADIOACTIVE SEED IMPLANT N/A 04/23/2015   Procedure: RADIOACTIVE SEED IMPLANT/BRACHYTHERAPY IMPLANT;  Surgeon: Irine Seal, MD;  Location: [facility];  Service: Urology;  Laterality: N/A;  . TONSILLECTOMY  as child    Social History   Socioeconomic History  . Marital status: Married  Spouse name: Not on file  . Number of children: Not on file  . Years of education: Not on file  . Highest education level: Not on file  Occupational History  . Not on file  Social Needs  . Financial resource strain: Not on file  . Food insecurity    Worry: Not on file    Inability: Not on file  . Transportation needs    Medical: Not on file    Non-medical: Not on file  Tobacco Use  . Smoking status: Former Smoker    Packs/day:  1.00    Years: 15.00    Pack years: 15.00    Types: Cigarettes    Start date: 09/09/2010    Quit date: 04/21/2011    Years since quitting: 8.0  . Smokeless tobacco: Former Systems developer    Types: Snuff    Quit date: 04/23/2015  . Tobacco comment: occasional  someday dips tobacco  Substance and Sexual Activity  . Alcohol use: No    Alcohol/week: 0.0 standard drinks  . Drug use: No  . Sexual activity: Not on file  Lifestyle  . Physical activity    Days per week: Not on file    Minutes per session: Not on file  . Stress: Not on file  Relationships  . Social Herbalist on phone: Not on file    Gets together: Not on file    Attends religious service: Not on file    Active member of club or organization: Not on file    Attends meetings of clubs or organizations: Not on file    Relationship status: Not on file  . Intimate partner violence    Fear of current or ex partner: Not on file    Emotionally abused: Not on file    Physically abused: Not on file    Forced sexual activity: Not on file  Other Topics Concern  . Not on file  Social History Narrative  . Not on file    Outpatient Encounter Medications as of 04/23/2019  Medication Sig  . amLODipine (NORVASC) 10 MG tablet Take 1 tablet (10 mg total) by mouth daily.  . Butenafine HCl 1 % cream Apply 1 application topically 2 (two) times daily.  . clopidogrel (PLAVIX) 75 MG tablet Take 1 tablet (75 mg total) by mouth daily.  Marland Kitchen esomeprazole (NEXIUM) 20 MG capsule Take 1 capsule (20 mg total) by mouth daily at 12 noon. (Patient taking differently: Take 20 mg daily as needed by mouth. )  . fenofibrate (TRICOR) 145 MG tablet Take 1 tablet (145 mg total) by mouth daily.  . hydrochlorothiazide (HYDRODIURIL) 25 MG tablet Take 1 tablet (25 mg total) by mouth daily.  . meclizine (ANTIVERT) 12.5 MG tablet Take 1 tablet (12.5 mg total) by mouth 3 (three) times daily as needed for dizziness.  . rosuvastatin (CRESTOR) 10 MG tablet Take 1 tablet  (10 mg total) by mouth every evening.  . tamsulosin (FLOMAX) 0.4 MG CAPS capsule Take 1 capsule (0.4 mg total) by mouth daily after supper.   No facility-administered encounter medications on file as of 04/23/2019.     Allergies  Allergen Reactions  . Penicillins Swelling    Has patient had a PCN reaction causing immediate rash, facial/tongue/throat swelling, SOB or lightheadedness with hypotension: No Has patient had a PCN reaction causing severe rash involving mucus membranes or skin necrosis: No Has patient had a PCN reaction that required hospitalization No Has patient had a PCN reaction  occurring within the last 10 years: No If all of the above answers are "NO", then may proceed with Cephalosporin use.    Review of Systems  Constitutional: Negative for appetite change, chills, fatigue, fever and unexpected weight change.  Eyes: Negative for photophobia and visual disturbance.  Cardiovascular: Negative for chest pain.  Gastrointestinal: Positive for nausea. Negative for abdominal pain and vomiting.  Musculoskeletal: Positive for gait problem.  Neurological: Positive for dizziness and vertigo. Negative for tremors, seizures, syncope, facial asymmetry, speech difficulty, weakness, light-headedness, numbness and headaches.  Psychiatric/Behavioral: Negative for confusion.  All other systems reviewed and are negative.        Observations/Objective: No vital signs or physical exam, this was a telephone or virtual health encounter.  Pt alert and oriented, answers all questions appropriately, and able to speak in full sentences.    Assessment and Plan: Tommy [name] was seen today for dizziness.  Diagnoses and all orders for this visit:  Dizziness Gait instability Reported symptoms concerning for neurovascular event. Pt does not want to go to the ED but is willing to have imaging completed. Order placed for stat MRI. Pt aware that if symptoms worsen, he is to go to the ED for evaluation  and treatment. Will trial low dose meclizine for symptom control after imaging is completed. Follow up in 1 week for reevaluation if MRI is negative.  -     meclizine (ANTIVERT) 12.5 MG tablet; Take 1 tablet (12.5 mg total) by mouth 3 (three) times daily as needed for dizziness. -     MR BRAIN WO CONTRAST; Future    Follow Up Instructions: Return in about 1 week (around 04/30/2019), or if symptoms worsen or fail to improve, for Dizziness.    I discussed the assessment and treatment plan with the patient. The patient was provided an opportunity to ask questions and all were answered. The patient agreed with the plan and demonstrated an understanding of the instructions.   The patient was advised to call back or seek an in-person evaluation if the symptoms worsen or if the condition fails to improve as anticipated.  The above assessment and management plan was discussed with the patient. The patient verbalized understanding of and has agreed to the management plan. Patient is aware to call the clinic if symptoms persist or worsen. Patient is aware when to return to the clinic for a follow-up visit. Patient educated on when it is appropriate to go to the emergency department.    I provided 15 minutes of non-face-to-face time during this encounter. The call started at 0850. The call ended at 0905. The other time was used for coordination of care.    Monia Pouch, FNP-C [facility] [address] [place], [place] [address] [phone]

## 2019-04-25 ENCOUNTER — Encounter: Payer: Self-pay | Admitting: Family Medicine

## 2019-04-25 ENCOUNTER — Ambulatory Visit (INDEPENDENT_AMBULATORY_CARE_PROVIDER_SITE_OTHER): Payer: Managed Care, Other (non HMO) | Admitting: Family Medicine

## 2019-04-25 DIAGNOSIS — R42 Dizziness and giddiness: Secondary | ICD-10-CM | POA: Diagnosis not present

## 2019-04-25 NOTE — Progress Notes (Signed)
Virtual Visit via telephone Note  I connected with Tommy [name] on 04/25/19 at 1430 by telephone and verified that I am speaking with the correct person using two identifiers. Tommy [name] is currently located at home and no other people are currently with her during visit. The provider, Fransisca Kaufmann [name], MD is located in their office at time of visit.  Call ended at 1437  I discussed the limitations, risks, security and privacy concerns of performing an evaluation and management service by telephone and the availability of in person appointments. I also discussed with the patient that there may be a patient responsible charge related to this service. The patient expressed understanding and agreed to proceed.   History and Present Illness: Patient is complaining of dizziness when he gets up out of bed he gets dizzy.  He has been dizzy while driving and walking over the past week. He feels like he might pass out or faint and it has been going. His bp had been lower and he is on amlodipine and hydrochlorothiazide and its heart and its been summertime  No diagnosis found.  Outpatient Encounter Medications as of 04/25/2019  Medication Sig  . amLODipine (NORVASC) 10 MG tablet Take 1 tablet (10 mg total) by mouth daily.  . Butenafine HCl 1 % cream Apply 1 application topically 2 (two) times daily.  . clopidogrel (PLAVIX) 75 MG tablet Take 1 tablet (75 mg total) by mouth daily.  Marland Kitchen esomeprazole (NEXIUM) 20 MG capsule Take 1 capsule (20 mg total) by mouth daily at 12 noon. (Patient taking differently: Take 20 mg daily as needed by mouth. )  . fenofibrate (TRICOR) 145 MG tablet Take 1 tablet (145 mg total) by mouth daily.  . hydrochlorothiazide (HYDRODIURIL) 25 MG tablet Take 1 tablet (25 mg total) by mouth daily.  . meclizine (ANTIVERT) 12.5 MG tablet Take 1 tablet (12.5 mg total) by mouth 3 (three) times daily as needed for dizziness.  . rosuvastatin (CRESTOR) 10 MG tablet Take 1 tablet (10 mg  total) by mouth every evening.  . tamsulosin (FLOMAX) 0.4 MG CAPS capsule Take 1 capsule (0.4 mg total) by mouth daily after supper.   No facility-administered encounter medications on file as of 04/25/2019.     Review of Systems  Constitutional: Negative for chills and fever.  Respiratory: Negative for chest tightness, shortness of breath and wheezing.   Cardiovascular: Negative for chest pain, palpitations and leg swelling.  Musculoskeletal: Negative for back pain and gait problem.  Skin: Negative for rash.  Neurological: Positive for dizziness and light-headedness. Negative for weakness, numbness and headaches.  All other systems reviewed and are negative.   Observations/Objective: Patient sounds comfortable and in no acute distress  Assessment and Plan: Problem List Items Addressed This Visit    None    Visit Diagnoses    Lightheadedness    -  Primary       Follow Up Instructions:  Stop hctz and see if improves  give me a call with bp's in 2 weeks   I discussed the assessment and treatment plan with the patient. The patient was provided an opportunity to ask questions and all were answered. The patient agreed with the plan and demonstrated an understanding of the instructions.   The patient was advised to call back or seek an in-person evaluation if the symptoms worsen or if the condition fails to improve as anticipated.  The above assessment and management plan was discussed with the patient. The patient  verbalized understanding of and has agreed to the management plan. Patient is aware to call the clinic if symptoms persist or worsen. Patient is aware when to return to the clinic for a follow-up visit. Patient educated on when it is appropriate to go to the emergency department.    I provided 7 minutes of non-face-to-face time during this encounter.    Worthy Rancher, MD

## 2019-07-02 ENCOUNTER — Telehealth: Payer: Self-pay | Admitting: Family Medicine

## 2019-07-03 ENCOUNTER — Ambulatory Visit (INDEPENDENT_AMBULATORY_CARE_PROVIDER_SITE_OTHER): Payer: Managed Care, Other (non HMO) | Admitting: Family Medicine

## 2019-07-03 ENCOUNTER — Encounter: Payer: Self-pay | Admitting: Family Medicine

## 2019-07-03 ENCOUNTER — Other Ambulatory Visit: Payer: Self-pay

## 2019-07-03 VITALS — BP 182/96 | HR 64 | Temp 98.2°F | Resp 22 | Ht 71.0 in | Wt 189.0 lb

## 2019-07-03 DIAGNOSIS — E785 Hyperlipidemia, unspecified: Secondary | ICD-10-CM | POA: Diagnosis not present

## 2019-07-03 DIAGNOSIS — I739 Peripheral vascular disease, unspecified: Secondary | ICD-10-CM | POA: Diagnosis not present

## 2019-07-03 DIAGNOSIS — Z23 Encounter for immunization: Secondary | ICD-10-CM | POA: Diagnosis not present

## 2019-07-03 DIAGNOSIS — I2510 Atherosclerotic heart disease of native coronary artery without angina pectoris: Secondary | ICD-10-CM | POA: Insufficient documentation

## 2019-07-03 DIAGNOSIS — K219 Gastro-esophageal reflux disease without esophagitis: Secondary | ICD-10-CM | POA: Diagnosis not present

## 2019-07-03 DIAGNOSIS — I10 Essential (primary) hypertension: Secondary | ICD-10-CM

## 2019-07-03 DIAGNOSIS — N400 Enlarged prostate without lower urinary tract symptoms: Secondary | ICD-10-CM

## 2019-07-03 MED ORDER — ESOMEPRAZOLE MAGNESIUM 20 MG PO CPDR: 20.0000 mg | DELAYED_RELEASE_CAPSULE | Freq: Every day | ORAL | 5 refills | Status: DC

## 2019-07-03 MED ORDER — TAMSULOSIN HCL 0.4 MG PO CAPS: ORAL_CAPSULE | ORAL | 3 refills | Status: DC

## 2019-07-03 MED ORDER — FENOFIBRATE 145 MG PO TABS: 145.0000 mg | ORAL_TABLET | Freq: Every day | ORAL | 3 refills | Status: DC

## 2019-07-03 MED ORDER — AMLODIPINE BESYLATE 10 MG PO TABS: 10.0000 mg | ORAL_TABLET | Freq: Every day | ORAL | 3 refills | Status: DC

## 2019-07-03 MED ORDER — CLOPIDOGREL BISULFATE 75 MG PO TABS: 75.0000 mg | ORAL_TABLET | Freq: Every day | ORAL | 3 refills | Status: DC

## 2019-07-03 MED ORDER — ROSUVASTATIN CALCIUM 10 MG PO TABS: 10.0000 mg | ORAL_TABLET | Freq: Every evening | ORAL | 3 refills | Status: DC

## 2019-07-03 NOTE — Progress Notes (Signed)
Subjective:  Patient ID: Tommy [name], male    DOB: 1951-08-08, 68 y.o.   MRN: [id number]  Patient Care Team: [name], Fransisca Kaufmann, MD as PCP - General (Family Medicine)   Chief Complaint:  Hyperlipidemia, Hypertension, and Annual Exam (physical for work - form )   HPI: Tommy [name] is a 68 y.o. male presenting on 07/03/2019 for Hyperlipidemia, Hypertension, and Annual Exam (physical for work - form )   1. Essential hypertension  Complaint with meds - Yes Current Medications - Norvasc 10 mg. Was on lisinopril - hctz, that was stopped. Pt has noticed increased swelling in lower extremities and elevated blood pressure readings since the discontinuation of this medication. Checking BP at home ranging 150/80 - 160/90 Exercising Regularly - Yes Watching Salt intake - Yes Pertinent ROS:  Headache - No Fatigue - No Visual Disturbances - No Chest pain - No Dyspnea - No Palpitations - No LE edema - Yes They report good compliance with medications and can restate their regimen by memory. No medication side effects.  Family, social, and smoking history reviewed.   BP Readings from Last 3 Encounters:  07/03/19 (!) 182/96  02/08/19 (!) 147/89  07/05/18 129/81   CMP Latest Ref Rng & Units 05/09/2018 12/25/2017 08/25/2017  Glucose 65 - 99 mg/dL 95 171(H) 85  BUN 8 - 27 mg/dL _0 Creatinine 0.76 - 1.27 mg/dL 1.11 1.26 1.19  Sodium 134 - 144 mmol/L 144 146(H) 145(H)  Potassium 3.5 - 5.2 mmol/L 4.6 5.4(H) 4.9  Chloride 96 - 106 mmol/L 102 105 106  CO2 20 - 29 mmol/L _1 Calcium 8.6 - 10.2 mg/dL 10.7(H) 10.7(H) 10.8(H)  Total Protein 6.0 - 8.5 g/dL - 6.8 7.0  Total Bilirubin 0.0 - 1.2 mg/dL - 1.0 0.9  Alkaline Phos 39 - 117 IU/L - 38(L) 33(L)  AST 0 - 40 IU/L - 18 25  ALT 0 - 44 IU/L - 31 34      2. Hyperlipidemia LDL goal <130  Compliant with medications - Yes Current medications - Crestor, Tricor Side effects from medications - No Diet - generally healthy Exercise -  active daily  Lab Results  Component Value Date   CHOL 133 08/25/2017   HDL 39 (L) 08/25/2017   LDLCALC 78 08/25/2017   TRIG 78 08/25/2017   CHOLHDL 3.4 08/25/2017     Family and personal medical history reviewed. Smoking and ETOH history reviewed.    3. GERD without esophagitis  Compliant with medications - takes on an as needed basis per pt report Current medications - Nexium 20 mg  Adverse side effects - No Cough - No Sore throat - No Voice change - No Hemoptysis - No Dysphagia or dyspepsia - No Water brash - No Red Flags (weight loss, hematochezia, melena, weight loss, early satiety, fevers, odynophagia, or persistent vomiting) - No  4. PAD (peripheral artery disease) (HCC)  S/P left SFA stent. Followed by vascular, has upcoming appointment. Denies claudication. plavix as prescribed. No abnormal bleeding or bruising.    5. Benign prostatic hyperplasia without lower urinary tract symptoms  History of prostate cancer, seed implant in 2016. Followed by urology. Has upcoming appointment with urology. Denies symptoms.      Relevant past medical, surgical, family, and social history reviewed and updated as indicated.  Allergies and medications reviewed and updated. Date reviewed: Chart in Epic.   Past Medical History:  Diagnosis Date  . BPH with obstruction/lower urinary  tract symptoms    hyperplasia  . ED (erectile dysfunction)    arterial insuff.  . History of DVT of lower extremity   . History of gastric ulcer   . Hyperlipidemia   . Hypertension   . Peripheral vascular disease ([place])   . Prostate cancer [facility]) DX 01/01/15--  urologist-  dr Jeffie Pollock   Stage T1c, Gleason 3+4, PSA 5.73, vol 14m  . Wears glasses     Past Surgical History:  Procedure Laterality Date  . FEMORAL ARTERY STENT Left 07-22-2008  &  11-16-2010   left SFA//   In-Stent restenosis  . ORIF LEFT INDEX FINGER FX  09-28-2006  . PROSTATE BIOPSY  01/01/15  . PROSTATE SURGERY  July 2016   Seed Implant   . RADIOACTIVE SEED IMPLANT N/A 04/23/2015   Procedure: RADIOACTIVE SEED IMPLANT/BRACHYTHERAPY IMPLANT;  Surgeon: JIrine Seal MD;  [facility]  Service: Urology;  Laterality: N/A;  . TONSILLECTOMY  as child    Social History   Socioeconomic History  . Marital status: Married    Spouse name: Not on file  . Number of children: Not on file  . Years of education: Not on file  . Highest education level: Not on file  Occupational History  . Not on file  Social Needs  . Financial resource strain: Not on file  . Food insecurity    Worry: Not on file    Inability: Not on file  . Transportation needs    Medical: Not on file    Non-medical: Not on file  Tobacco Use  . Smoking status: Former Smoker    Packs/day: 1.00    Years: 15.00    Pack years: 15.00    Types: Cigarettes    Start date: 09/09/2010    Quit date: 04/21/2011    Years since quitting: 8.2  . Smokeless tobacco: Former [employer] developer   Types: Snuff    Quit date: 04/23/2015  . Tobacco comment: occasional  someday dips tobacco  Substance and Sexual Activity  . Alcohol use: No    Alcohol/week: 0.0 standard drinks  . Drug use: No  . Sexual activity: Not on file  Lifestyle  . Physical activity    Days per week: Not on file    Minutes per session: Not on file  . Stress: Not on file  Relationships  . Social [place] phone: Not on file    Gets together: Not on file    Attends religious service: Not on file    Active member of club or organization: Not on file    Attends meetings of clubs or organizations: Not on file    Relationship status: Not on file  . Intimate partner violence    Fear of current or ex partner: Not on file    Emotionally abused: Not on file    Physically abused: Not on file    Forced sexual activity: Not on file  Other Topics Concern  . Not on file  Social History Narrative  . Not on file    Outpatient Encounter Medications as of 07/03/2019  Medication Sig  .  amLODipine (NORVASC) 10 MG tablet Take 1 tablet (10 mg total) by mouth daily.  . clopidogrel (PLAVIX) 75 MG tablet Take 1 tablet (75 mg total) by mouth daily.  .Marland Kitchenesomeprazole (NEXIUM) 20 MG capsule Take 1 capsule (20 mg total) by mouth daily at 12 noon.  . fenofibrate (TRICOR) 145 MG tablet Take 1 tablet (  145 mg total) by mouth daily.  . rosuvastatin (CRESTOR) 10 MG tablet Take 1 tablet (10 mg total) by mouth every evening.  . tamsulosin (FLOMAX) 0.4 MG CAPS capsule Take 1 capsule (0.4 mg total) by mouth daily after supper.  . [DISCONTINUED] amLODipine (NORVASC) 10 MG tablet Take 1 tablet (10 mg total) by mouth daily.  . [DISCONTINUED] clopidogrel (PLAVIX) 75 MG tablet Take 1 tablet (75 mg total) by mouth daily.  . [DISCONTINUED] esomeprazole (NEXIUM) 20 MG capsule Take 1 capsule (20 mg total) by mouth daily at 12 noon. (Patient taking differently: Take 20 mg daily as needed by mouth. )  . [DISCONTINUED] fenofibrate (TRICOR) 145 MG tablet Take 1 tablet (145 mg total) by mouth daily.  . [DISCONTINUED] rosuvastatin (CRESTOR) 10 MG tablet Take 1 tablet (10 mg total) by mouth every evening.  . [DISCONTINUED] tamsulosin (FLOMAX) 0.4 MG CAPS capsule Take 1 capsule (0.4 mg total) by mouth daily after supper.  . [DISCONTINUED] Butenafine HCl 1 % cream Apply 1 application topically 2 (two) times daily.  . [DISCONTINUED] meclizine (ANTIVERT) 12.5 MG tablet Take 1 tablet (12.5 mg total) by mouth 3 (three) times daily as needed for dizziness.   No facility-administered encounter medications on file as of 07/03/2019.     Allergies  Allergen Reactions  . Penicillins Swelling    Has patient had a PCN reaction causing immediate rash, facial/tongue/throat swelling, SOB or lightheadedness with hypotension: No Has patient had a PCN reaction causing severe rash involving mucus membranes or skin necrosis: No Has patient had a PCN reaction that required hospitalization No Has patient had a PCN reaction occurring  within the last 10 years: No If all of the above answers are "NO", then may proceed with Cephalosporin use.    Review of Systems  Constitutional: Negative for activity change, appetite change, chills, diaphoresis, fatigue, fever and unexpected weight change.  HENT: Negative.  Negative for sore throat, trouble swallowing and voice change.   Eyes: Negative.  Negative for photophobia and visual disturbance.  Respiratory: Negative for cough, choking, chest tightness, shortness of breath and wheezing.   Cardiovascular: Positive for leg swelling. Negative for chest pain and palpitations.  Gastrointestinal: Negative for abdominal distention, abdominal pain, anal bleeding, blood in stool, constipation, diarrhea, nausea, rectal pain and vomiting.  Endocrine: Negative.  Negative for cold intolerance, heat intolerance, polydipsia, polyphagia and polyuria.  Genitourinary: Negative for decreased urine volume, difficulty urinating, dysuria, enuresis, flank pain, frequency, hematuria, testicular pain and urgency.  Musculoskeletal: Negative for arthralgias, gait problem, joint swelling and myalgias.  Skin: Negative.  Negative for color change, pallor, rash and wound.  Allergic/Immunologic: Negative.   Neurological: Negative for dizziness, tremors, seizures, syncope, facial asymmetry, speech difficulty, weakness, light-headedness, numbness and headaches.  Hematological: Negative.  Does not bruise/bleed easily.  Psychiatric/Behavioral: Negative for agitation, confusion, hallucinations, sleep disturbance and suicidal ideas.  All other systems reviewed and are negative.       Objective:  BP (!) 182/96   Pulse 64   Temp 98.2 F (36.8 C)   Resp (!) 22   Ht _0  (1.803 m)   Wt 189 lb (85.7 kg)   SpO2 97%   BMI 26.36 kg/m    Wt Readings from Last 3 Encounters:  07/03/19 189 lb (85.7 kg)  02/08/19 191 lb 12.8 oz (87 kg)  07/05/18 190 lb 12.8 oz (86.5 kg)    Physical Exam Vitals signs and nursing  note reviewed.  Constitutional:      General: He is  not in acute distress.    Appearance: Normal appearance. He is well-developed and well-groomed. He is not ill-appearing, toxic-appearing or diaphoretic.  HENT:     Head: Normocephalic and atraumatic.     Jaw: There is normal jaw occlusion.     Right Ear: Hearing, tympanic membrane, ear canal and external ear normal.     Left Ear: Hearing, tympanic membrane, ear canal and external ear normal.     Nose: Nose normal.     Mouth/Throat:     Lips: Pink.     Mouth: Mucous membranes are moist.     Pharynx: Oropharynx is clear. Uvula midline.  Eyes:     General: Lids are normal.     Extraocular Movements: Extraocular movements intact.     Conjunctiva/sclera: Conjunctivae normal.     Pupils: Pupils are equal, round, and reactive to light.  Neck:     Musculoskeletal: Normal range of motion and neck supple.     Thyroid: No thyroid mass, thyromegaly or thyroid tenderness.     Vascular: No carotid bruit or JVD.     Trachea: Trachea and phonation normal.  Cardiovascular:     Rate and Rhythm: Normal rate and regular rhythm.     Chest Wall: PMI is not displaced.     Pulses:          Dorsalis pedis pulses are 2+ on the right side and 2+ on the left side.       Posterior tibial pulses are 2+ on the right side and 2+ on the left side.     Heart sounds: Normal heart sounds. No murmur. No friction rub. No gallop.   Pulmonary:     Effort: Pulmonary effort is normal. No respiratory distress.     Breath sounds: Normal breath sounds. No wheezing.  Abdominal:     General: Bowel sounds are normal. There is no distension or abdominal bruit.     Palpations: Abdomen is soft. There is no hepatomegaly or splenomegaly.     Tenderness: There is no abdominal tenderness. There is no right CVA tenderness or left CVA tenderness.     Hernia: No hernia is present.  Musculoskeletal: Normal range of motion.     Right lower leg: 1+ Edema present.     Left lower leg:  1+ Edema present.  Lymphadenopathy:     Cervical: No cervical adenopathy.  Skin:    General: Skin is warm and dry.     Capillary Refill: Capillary refill takes less than 2 seconds.     Coloration: Skin is not cyanotic, jaundiced or pale.     Findings: No rash.  Neurological:     General: No focal deficit present.     Mental Status: He is alert and oriented to person, place, and time.     Cranial Nerves: Cranial nerves are intact.     Sensory: Sensation is intact.     Motor: Motor function is intact.     Coordination: Coordination is intact.     Gait: Gait is intact.     Deep Tendon Reflexes: Reflexes are normal and symmetric.  Psychiatric:        Attention and Perception: Attention and perception normal.        Mood and Affect: Mood and affect normal.        Speech: Speech normal.        Behavior: Behavior normal. Behavior is cooperative.        Thought Content: Thought content normal.  Cognition and Memory: Cognition and memory normal.        Judgment: Judgment normal.     Results for orders placed or performed in visit on 07/05/18  Bayer DCA Hb A1c Waived  Result Value Ref Range   HB A1C (BAYER DCA - WAIVED) 5.2 <7.0 %       Pertinent labs & imaging results that were available during my care of the patient were reviewed by me and considered in my medical decision making.  Assessment & Plan:  Vaughn was seen today for hyperlipidemia, hypertension and annual exam.  Diagnoses and all orders for this visit:  Essential hypertension BP poorly controlled. Changes were made in regimen today. Will restart lisinopril - HCTZ, will recheck BMP and BP in 2 weeks. Daily blood pressure log given with instructions on how to fill out and told to bring to next visit. Goal BP 130/80. Pt aware to report any persistent high or low readings. DASH diet and exercise encouraged. Exercise at least 150 minutes per week and increase as tolerated. Goal BMI > 25. Stress management encouraged.  Smoking cessation discussed. Avoid excessive alcohol. Avoid NSAID's. Avoid more than 2000 mg of sodium daily. Medications as prescribed. Follow up as scheduled.  -     CMP14+EGFR -     CBC with Differential/Platelet -     Thyroid Panel With TSH -     Microalbumin / creatinine urine ratio -     amLODipine (NORVASC) 10 MG tablet; Take 1 tablet (10 mg total) by mouth daily.  Hyperlipidemia LDL goal <130 Diet encouraged - increase intake of fresh fruits and vegetables, increase intake of lean proteins. Bake, broil, or grill foods. Avoid fried, greasy, and fatty foods. Avoid fast foods. Increase intake of fiber-rich whole grains. Exercise encouraged - at least 150 minutes per week and advance as tolerated.  Goal BMI < 25. Continue medications as prescribed. Follow up in 3-6 months as discussed.  -     Lipid panel -     fenofibrate (TRICOR) 145 MG tablet; Take 1 tablet (145 mg total) by mouth daily. -     rosuvastatin (CRESTOR) 10 MG tablet; Take 1 tablet (10 mg total) by mouth every evening.  GERD without esophagitis Diet discussed. Avoid fried, spicy, fatty, and acidic foods. Avoid caffeine, nicotine, and alcohol. Do not eat 2-3 hours before bedtime and stay upright for at least 1-2 hours after eating. Eat small frequent meals. Avoid NSAID's like motrin and aleve. Medications as prescribed. Report any new or worsening symptoms. Follow up as discussed or sooner if needed.   -     esomeprazole (NEXIUM) 20 MG capsule; Take 1 capsule (20 mg total) by mouth daily at 12 noon.  PAD (peripheral artery disease) (HCC) No claudication. Does have follow up with vascular in the next few weeks. Continue below.  -     clopidogrel (PLAVIX) 75 MG tablet; Take 1 tablet (75 mg total) by mouth daily.  Benign prostatic hyperplasia without lower urinary tract symptoms History of prostate with seed implant in 2016. Does follow with urology. Will check PSA today.  -     tamsulosin (FLOMAX) 0.4 MG CAPS capsule; Take 1  capsule (0.4 mg total) by mouth daily after supper. -     PSA, total and free  Need for immunization against influenza -     Flu Vaccine QUAD High Dose(Fluad)  Need for pneumococcal vaccination -     Pneumococcal polysaccharide vaccine 23-valent greater than or equal to  2yo subcutaneous/IM     Continue all other maintenance medications.  Follow up plan: Return in about 2 weeks (around 07/17/2019), or if symptoms worsen or fail to improve, for BP, BMP.  Continue healthy lifestyle choices, including diet (rich in fruits, vegetables, and lean proteins, and low in salt and simple carbohydrates) and exercise (at least 30 minutes of moderate physical activity daily).  Educational handout given for DASH diet  The above assessment and management plan was discussed with the patient. The patient verbalized understanding of and has agreed to the management plan. Patient is aware to call the clinic if they develop any new symptoms or if symptoms persist or worsen. Patient is aware when to return to the clinic for a follow-up visit. Patient educated on when it is appropriate to go to the emergency department.   Monia Pouch, FNP-C [facility] [phone]

## 2019-07-03 NOTE — Patient Instructions (Signed)
DASH Eating Plan DASH stands for "Dietary Approaches to Stop Hypertension." The DASH eating plan is a healthy eating plan that has been shown to reduce high blood pressure (hypertension). Additional health benefits may include reducing the risk of type 2 diabetes mellitus, heart disease, and stroke. The DASH eating plan may also help with weight loss.  WHAT DO I NEED TO KNOW ABOUT THE DASH EATING PLAN? For the DASH eating plan, you will follow these general guidelines:  Choose foods with a percent daily value for sodium of less than 5% (as listed on the food label).  Use salt-free seasonings or herbs instead of table salt or sea salt.  Check with your health care provider or pharmacist before using salt substitutes.  Eat lower-sodium products, often labeled as "lower sodium" or "no salt added."  Eat fresh foods.  Eat more vegetables, fruits, and low-fat dairy products.  Choose whole grains. Look for the word "whole" as the first word in the ingredient list.  Choose fish and skinless chicken or turkey more often than red meat. Limit fish, poultry, and meat to 6 oz (170 g) each day.  Limit sweets, desserts, sugars, and sugary drinks.  Choose heart-healthy fats.  Limit cheese to 1 oz (28 g) per day.  Eat more home-cooked food and less restaurant, buffet, and fast food.  Limit fried foods.  Cook foods using methods other than frying.  Limit canned vegetables. If you do use them, rinse them well to decrease the sodium.  When eating at a restaurant, ask that your food be prepared with less salt, or no salt if possible.  WHAT FOODS CAN I EAT? Seek help from a dietitian for individual calorie needs.  Grains Whole grain or whole wheat bread. Brown rice. Whole grain or whole wheat pasta. Quinoa, bulgur, and whole grain cereals. Low-sodium cereals. Corn or whole wheat flour tortillas. Whole grain cornbread. Whole grain crackers. Low-sodium crackers.  Vegetables Fresh or frozen  vegetables (raw, steamed, roasted, or grilled). Low-sodium or reduced-sodium tomato and vegetable juices. Low-sodium or reduced-sodium tomato sauce and paste. Low-sodium or reduced-sodium canned vegetables.   Fruits All fresh, canned (in natural juice), or frozen fruits.  Meat and Other Protein Products Ground beef (85% or leaner), grass-fed beef, or beef trimmed of fat. Skinless chicken or turkey. Ground chicken or turkey. Pork trimmed of fat. All fish and seafood. Eggs. Dried beans, peas, or lentils. Unsalted nuts and seeds. Unsalted canned beans.  Dairy Low-fat dairy products, such as skim or 1% milk, 2% or reduced-fat cheeses, low-fat ricotta or cottage cheese, or plain low-fat yogurt. Low-sodium or reduced-sodium cheeses.  Fats and Oils Tub margarines without trans fats. Light or reduced-fat mayonnaise and salad dressings (reduced sodium). Avocado. Safflower, olive, or canola oils. Natural peanut or almond butter.  Other Unsalted popcorn and pretzels. The items listed above may not be a complete list of recommended foods or beverages. Contact your dietitian for more options.  WHAT FOODS ARE NOT RECOMMENDED?  Grains White bread. White pasta. White rice. Refined cornbread. Bagels and croissants. Crackers that contain trans fat.  Vegetables Creamed or fried vegetables. Vegetables in a cheese sauce. Regular canned vegetables. Regular canned tomato sauce and paste. Regular tomato and vegetable juices.  Fruits Dried fruits. Canned fruit in light or heavy syrup. Fruit juice.  Meat and Other Protein Products Fatty cuts of meat. Ribs, chicken wings, bacon, sausage, bologna, salami, chitterlings, fatback, hot dogs, bratwurst, and packaged luncheon meats. Salted nuts and seeds. Canned beans with salt.    Dairy Whole or 2% milk, cream, half-and-half, and cream cheese. Whole-fat or sweetened yogurt. Full-fat cheeses or blue cheese. Nondairy creamers and whipped toppings. Processed cheese,  cheese spreads, or cheese curds.  Condiments Onion and garlic salt, seasoned salt, table salt, and sea salt. Canned and packaged gravies. Worcestershire sauce. Tartar sauce. Barbecue sauce. Teriyaki sauce. Soy sauce, including reduced sodium. Steak sauce. Fish sauce. Oyster sauce. Cocktail sauce. Horseradish. Ketchup and mustard. Meat flavorings and tenderizers. Bouillon cubes. Hot sauce. Tabasco sauce. Marinades. Taco seasonings. Relishes.  Fats and Oils Butter, stick margarine, lard, shortening, ghee, and bacon fat. Coconut, palm kernel, or palm oils. Regular salad dressings.  Other Pickles and olives. Salted popcorn and pretzels.  The items listed above may not be a complete list of foods and beverages to avoid. Contact your dietitian for more information.  WHERE CAN I FIND MORE INFORMATION? National Heart, Lung, and [employer] Institute: www.nhlbi.nih.gov/health/health-topics/topics/dash/ Document Released: 09/15/2011 Document Revised: 02/10/2014 Document Reviewed: 07/31/2013 ExitCare Patient Information 2015 [employer]. This information is not intended to replace advice given to you by your health care provider. Make sure you discuss any questions you have with your health care provider.   I think that you would greatly benefit from seeing a nutritionist.  If you are interested, please call Dr Sykes at [phone] to schedule an appointment.   

## 2019-07-04 LAB — LIPID PANEL
Chol/HDL Ratio: 3.2 ratio (ref 0.0–5.0)
Cholesterol, Total: 122 mg/dL (ref 100–199)
HDL: 38 mg/dL — ABNORMAL LOW (ref >39)
LDL Chol Calc (NIH): 69 mg/dL (ref 0–99)
Triglycerides: 76 mg/dL (ref 0–149)
VLDL Cholesterol Cal: 15 mg/dL (ref 5–40)

## 2019-07-04 LAB — CBC WITH DIFFERENTIAL/PLATELET
Basophils Absolute: 0 10*3/uL (ref 0.0–0.2)
Basos: 1 %
EOS (ABSOLUTE): 0.1 10*3/uL (ref 0.0–0.4)
Eos: 3 %
Hematocrit: 39.5 % (ref 37.5–51.0)
Hemoglobin: 13.6 g/dL (ref 13.0–17.7)
Immature Grans (Abs): 0 10*3/uL (ref 0.0–0.1)
Immature Granulocytes: 1 %
Lymphocytes Absolute: 1.1 10*3/uL (ref 0.7–3.1)
Lymphs: 31 %
MCH: 28.8 pg (ref 26.6–33.0)
MCHC: 34.4 g/dL (ref 31.5–35.7)
MCV: 84 fL (ref 79–97)
Monocytes Absolute: 0.3 10*3/uL (ref 0.1–0.9)
Monocytes: 9 %
Neutrophils Absolute: 2.1 10*3/uL (ref 1.4–7.0)
Neutrophils: 55 %
Platelets: 206 10*3/uL (ref 150–450)
RBC: 4.72 x10E6/uL (ref 4.14–5.80)
RDW: 12.2 % (ref 11.6–15.4)
WBC: 3.6 10*3/uL (ref 3.4–10.8)

## 2019-07-04 LAB — MICROALBUMIN / CREATININE URINE RATIO
Creatinine, Urine: 143.7 mg/dL
Microalb/Creat Ratio: 7 mg/g creat (ref 0–29)
Microalbumin, Urine: 10.7 ug/mL

## 2019-07-04 LAB — PSA, TOTAL AND FREE
PSA, Free: <0.02 ng/mL
Prostate Specific Ag, Serum: <0.1 ng/mL (ref 0.0–4.0)

## 2019-07-04 LAB — CMP14+EGFR
ALT: 27 IU/L (ref 0–44)
AST: 28 IU/L (ref 0–40)
Albumin/Globulin Ratio: 2 (ref 1.2–2.2)
Albumin: 4.7 g/dL (ref 3.8–4.8)
Alkaline Phosphatase: 41 IU/L (ref 39–117)
BUN/Creatinine Ratio: 15 (ref 10–24)
BUN: 17 mg/dL (ref 8–27)
Bilirubin Total: 0.8 mg/dL (ref 0.0–1.2)
CO2: 25 mmol/L (ref 20–29)
Calcium: 10.7 mg/dL — ABNORMAL HIGH (ref 8.6–10.2)
Chloride: 108 mmol/L — ABNORMAL HIGH (ref 96–106)
Creatinine, Ser: 1.13 mg/dL (ref 0.76–1.27)
GFR calc Af Amer: 76 mL/min/{1.73_m2} (ref >59)
GFR calc non Af Amer: 65 mL/min/{1.73_m2} (ref >59)
Globulin, Total: 2.3 g/dL (ref 1.5–4.5)
Glucose: 104 mg/dL — ABNORMAL HIGH (ref 65–99)
Potassium: 4.6 mmol/L (ref 3.5–5.2)
Sodium: 146 mmol/L — ABNORMAL HIGH (ref 134–144)
Total Protein: 7 g/dL (ref 6.0–8.5)

## 2019-07-04 LAB — THYROID PANEL WITH TSH
Free Thyroxine Index: 2.2 (ref 1.2–4.9)
T3 Uptake Ratio: 26 % (ref 24–39)
T4, Total: 8.3 ug/dL (ref 4.5–12.0)
TSH: 0.961 u[IU]/mL (ref 0.450–4.500)

## 2019-07-31 ENCOUNTER — Other Ambulatory Visit: Payer: Self-pay

## 2019-07-31 ENCOUNTER — Encounter: Payer: Self-pay | Admitting: Family Medicine

## 2019-07-31 ENCOUNTER — Ambulatory Visit (INDEPENDENT_AMBULATORY_CARE_PROVIDER_SITE_OTHER): Payer: Managed Care, Other (non HMO) | Admitting: Family Medicine

## 2019-07-31 VITALS — BP 143/86 | HR 67 | Temp 99.0°F | Resp 20 | Ht 71.0 in | Wt 188.0 lb

## 2019-07-31 DIAGNOSIS — I10 Essential (primary) hypertension: Secondary | ICD-10-CM

## 2019-07-31 DIAGNOSIS — N289 Disorder of kidney and ureter, unspecified: Secondary | ICD-10-CM

## 2019-07-31 MED ORDER — HYDROCHLOROTHIAZIDE 12.5 MG PO CAPS: 12.5000 mg | ORAL_CAPSULE | Freq: Every day | ORAL | 1 refills | Status: DC

## 2019-07-31 NOTE — Progress Notes (Signed)
Subjective:  Patient ID: Tommy [name], male    DOB: 06-08-1951, 68 y.o.   MRN: [id number]  Patient Care Team: [name], Fransisca Kaufmann, MD as PCP - General (Family Medicine)   Chief Complaint:  Hypertension (2-3 week follow up)   HPI: Tommy [name] is a 68 y.o. male presenting on 07/31/2019 for Hypertension (2-3 week follow up)   Pt presenting today for management of HTN. Pt was seen on 07/03/2019 for his annual physical exam. Pt reports he was having dizziness and his HCTZ was stopped on 04/25/2019. Pt reported on 07/03/2019 that the dizziness had subsided but his blood pressure had been elevated on a regular basis at home. Pt was to restart lisinopril/HCTZ on 07/03/2019 but did not. States he did restart the HCTZ only. States his blood pressure has been very well controlled since restarting the HCTZ. He denies dizziness, chest pain, shortness of breath, leg swelling, confusion, or weakness. No palpitations or syncope.   Hypertension This is a chronic problem. The current episode started more than 1 year ago. The problem has been waxing and waning since onset. Pertinent negatives include no anxiety, blurred vision, chest pain, headaches, malaise/fatigue, neck pain, orthopnea, palpitations, peripheral edema, PND, shortness of breath or sweats. Risk factors for coronary artery disease include male gender. Past treatments include ACE inhibitors, calcium channel blockers and diuretics. The current treatment provides moderate improvement. Compliance problems include diet and exercise.     Relevant past medical, surgical, family, and social history reviewed and updated as indicated.  Allergies and medications reviewed and updated. Date reviewed: Chart in Epic.   Past Medical History:  Diagnosis Date  . BPH with obstruction/lower urinary tract symptoms    hyperplasia  . ED (erectile dysfunction)    arterial insuff.  . History of DVT of lower extremity   . History of gastric ulcer   .  Hyperlipidemia   . Hypertension   . Peripheral vascular disease ([place])   . Prostate cancer [facility]) DX 01/01/15--  urologist-  dr Jeffie Pollock   Stage T1c, Gleason 3+4, PSA 5.73, vol 83m  . Wears glasses     Past Surgical History:  Procedure Laterality Date  . FEMORAL ARTERY STENT Left 07-22-2008  &  11-16-2010   left SFA//   In-Stent restenosis  . ORIF LEFT INDEX FINGER FX  09-28-2006  . PROSTATE BIOPSY  01/01/15  . PROSTATE SURGERY  July 2016   Seed Implant  . RADIOACTIVE SEED IMPLANT N/A 04/23/2015   Procedure: RADIOACTIVE SEED IMPLANT/BRACHYTHERAPY IMPLANT;  Surgeon: JIrine Seal MD;  Location: [facility]  Service: Urology;  Laterality: N/A;  . TONSILLECTOMY  as child    Social History   Socioeconomic History  . Marital status: Married    Spouse name: Not on file  . Number of children: Not on file  . Years of education: Not on file  . Highest education level: Not on file  Occupational History  . Not on file  Social Needs  . Financial resource strain: Not on file  . Food insecurity    Worry: Not on file    Inability: Not on file  . Transportation needs    Medical: Not on file    Non-medical: Not on file  Tobacco Use  . Smoking status: Former Smoker    Packs/day: 1.00    Years: 15.00    Pack years: 15.00    Types: Cigarettes    Start date: 09/09/2010    Quit date: 04/21/2011  Years since quitting: 8.2  . Smokeless tobacco: Former Systems developer    Types: Snuff    Quit date: 04/23/2015  . Tobacco comment: occasional  someday dips tobacco  Substance and Sexual Activity  . Alcohol use: No    Alcohol/week: 0.0 standard drinks  . Drug use: No  . Sexual activity: Not on file  Lifestyle  . Physical activity    Days per week: Not on file    Minutes per session: Not on file  . Stress: Not on file  Relationships  . Social Herbalist on phone: Not on file    Gets together: Not on file    Attends religious service: Not on file    Active member of club or  organization: Not on file    Attends meetings of clubs or organizations: Not on file    Relationship status: Not on file  . Intimate partner violence    Fear of current or ex partner: Not on file    Emotionally abused: Not on file    Physically abused: Not on file    Forced sexual activity: Not on file  Other Topics Concern  . Not on file  Social History Narrative  . Not on file    Outpatient Encounter Medications as of 07/31/2019  Medication Sig  . amLODipine (NORVASC) 10 MG tablet Take 1 tablet (10 mg total) by mouth daily.  . clopidogrel (PLAVIX) 75 MG tablet Take 1 tablet (75 mg total) by mouth daily.  Marland Kitchen esomeprazole (NEXIUM) 20 MG capsule Take 1 capsule (20 mg total) by mouth daily at 12 noon.  . fenofibrate (TRICOR) 145 MG tablet Take 1 tablet (145 mg total) by mouth daily.  . rosuvastatin (CRESTOR) 10 MG tablet Take 1 tablet (10 mg total) by mouth every evening.  . tamsulosin (FLOMAX) 0.4 MG CAPS capsule Take 1 capsule (0.4 mg total) by mouth daily after supper.  . hydrochlorothiazide (MICROZIDE) 12.5 MG capsule Take 1 capsule (12.5 mg total) by mouth daily.   No facility-administered encounter medications on file as of 07/31/2019.     Allergies  Allergen Reactions  . Penicillins Swelling    Has patient had a PCN reaction causing immediate rash, facial/tongue/throat swelling, SOB or lightheadedness with hypotension: No Has patient had a PCN reaction causing severe rash involving mucus membranes or skin necrosis: No Has patient had a PCN reaction that required hospitalization No Has patient had a PCN reaction occurring within the last 10 years: No If all of the above answers are "NO", then may proceed with Cephalosporin use.    Review of Systems  Constitutional: Negative for activity change, appetite change, chills, diaphoresis, fatigue, fever, malaise/fatigue and unexpected weight change.  HENT: Negative.   Eyes: Negative.  Negative for blurred vision, photophobia and  visual disturbance.  Respiratory: Negative for cough, chest tightness and shortness of breath.   Cardiovascular: Negative for chest pain, palpitations, orthopnea, leg swelling and PND.  Gastrointestinal: Negative for abdominal distention, abdominal pain, blood in stool, constipation, diarrhea, nausea and vomiting.  Endocrine: Negative.  Negative for cold intolerance, heat intolerance, polydipsia, polyphagia and polyuria.  Genitourinary: Negative for decreased urine volume, difficulty urinating, dysuria, frequency and urgency.  Musculoskeletal: Negative for arthralgias, myalgias and neck pain.  Skin: Negative.   Allergic/Immunologic: Negative.   Neurological: Negative for dizziness, tremors, seizures, syncope, facial asymmetry, speech difficulty, weakness, light-headedness, numbness and headaches.  Hematological: Negative.   Psychiatric/Behavioral: Negative for confusion, hallucinations, sleep disturbance and suicidal ideas.  All other  systems reviewed and are negative.       Objective:  BP (!) 143/86 (BP Location: Right Arm, Cuff Size: Large)   Pulse 67   Temp 99 F (37.2 C)   Resp 20   Ht _0  (1.803 m)   Wt 188 lb (85.3 kg)   SpO2 99%   BMI 26.22 kg/m    Wt Readings from Last 3 Encounters:  07/31/19 188 lb (85.3 kg)  07/03/19 189 lb (85.7 kg)  02/08/19 191 lb 12.8 oz (87 kg)    Physical Exam Vitals signs and nursing note reviewed.  Constitutional:      General: He is not in acute distress.    Appearance: Normal appearance. He is well-developed and well-groomed. He is not ill-appearing, toxic-appearing or diaphoretic.  HENT:     Head: Normocephalic and atraumatic.     Jaw: There is normal jaw occlusion.     Right Ear: Hearing normal.     Left Ear: Hearing normal.     Nose: Nose normal.     Mouth/Throat:     Lips: Pink.     Mouth: Mucous membranes are moist.     Pharynx: Oropharynx is clear. Uvula midline.  Eyes:     General: Lids are normal.     Extraocular  Movements: Extraocular movements intact.     Conjunctiva/sclera: Conjunctivae normal.     Pupils: Pupils are equal, round, and reactive to light.  Neck:     Musculoskeletal: Normal range of motion and neck supple.     Thyroid: No thyroid mass, thyromegaly or thyroid tenderness.     Vascular: No carotid bruit or JVD.     Trachea: Trachea and phonation normal.  Cardiovascular:     Rate and Rhythm: Normal rate and regular rhythm.     Chest Wall: PMI is not displaced.     Pulses: Normal pulses.     Heart sounds: Normal heart sounds. No murmur. No friction rub. No gallop.   Pulmonary:     Effort: Pulmonary effort is normal. No respiratory distress.     Breath sounds: Normal breath sounds. No wheezing.  Abdominal:     General: Bowel sounds are normal. There is no distension or abdominal bruit.     Palpations: Abdomen is soft. There is no hepatomegaly or splenomegaly.     Tenderness: There is no abdominal tenderness. There is no right CVA tenderness or left CVA tenderness.     Hernia: No hernia is present.  Musculoskeletal: Normal range of motion.     Right lower leg: No edema.     Left lower leg: No edema.  Lymphadenopathy:     Cervical: No cervical adenopathy.  Skin:    General: Skin is warm and dry.     Capillary Refill: Capillary refill takes less than 2 seconds.     Coloration: Skin is not cyanotic, jaundiced or pale.     Findings: No rash.  Neurological:     General: No focal deficit present.     Mental Status: He is alert and oriented to person, place, and time.     Cranial Nerves: Cranial nerves are intact. No cranial nerve deficit.     Sensory: Sensation is intact. No sensory deficit.     Motor: Motor function is intact. No weakness.     Coordination: Coordination is intact. Coordination normal.     Gait: Gait is intact. Gait normal.     Deep Tendon Reflexes: Reflexes are normal and symmetric. Reflexes normal.  Psychiatric:        Attention and Perception: Attention and  perception normal.        Mood and Affect: Mood and affect normal.        Speech: Speech normal.        Behavior: Behavior normal. Behavior is cooperative.        Thought Content: Thought content normal.        Cognition and Memory: Cognition and memory normal.        Judgment: Judgment normal.     Results for orders placed or performed in visit on 07/03/19  CMP14+EGFR  Result Value Ref Range   Glucose 104 (H) 65 - 99 mg/dL   BUN 17 8 - 27 mg/dL   Creatinine, Ser 1.13 0.76 - 1.27 mg/dL   GFR calc non Af Amer 65 >59 mL/min/1.73   GFR calc Af Amer 76 >59 mL/min/1.73   BUN/Creatinine Ratio 15 10 - 24   Sodium 146 (H) 134 - 144 mmol/L   Potassium 4.6 3.5 - 5.2 mmol/L   Chloride 108 (H) 96 - 106 mmol/L   CO2 25 20 - 29 mmol/L   Calcium 10.7 (H) 8.6 - 10.2 mg/dL   Total Protein 7.0 6.0 - 8.5 g/dL   Albumin 4.7 3.8 - 4.8 g/dL   Globulin, Total 2.3 1.5 - 4.5 g/dL   Albumin/Globulin Ratio 2.0 1.2 - 2.2   Bilirubin Total 0.8 0.0 - 1.2 mg/dL   Alkaline Phosphatase 41 39 - 117 IU/L   AST 28 0 - 40 IU/L   ALT 27 0 - 44 IU/L  CBC with Differential/Platelet  Result Value Ref Range   WBC 3.6 3.4 - 10.8 x10E3/uL   RBC 4.72 4.14 - 5.80 x10E6/uL   Hemoglobin 13.6 13.0 - 17.7 g/dL   Hematocrit 39.5 37.5 - 51.0 %   MCV 84 79 - 97 fL   MCH 28.8 26.6 - 33.0 pg   MCHC 34.4 31.5 - 35.7 g/dL   RDW 12.2 11.6 - 15.4 %   Platelets 206 150 - 450 x10E3/uL   Neutrophils 55 Not Estab. %   Lymphs 31 Not Estab. %   Monocytes 9 Not Estab. %   Eos 3 Not Estab. %   Basos 1 Not Estab. %   Neutrophils Absolute 2.1 1.4 - 7.0 x10E3/uL   Lymphocytes Absolute 1.1 0.7 - 3.1 x10E3/uL   Monocytes Absolute 0.3 0.1 - 0.9 x10E3/uL   EOS (ABSOLUTE) 0.1 0.0 - 0.4 x10E3/uL   Basophils Absolute 0.0 0.0 - 0.2 x10E3/uL   Immature Granulocytes 1 Not Estab. %   Immature Grans (Abs) 0.0 0.0 - 0.1 x10E3/uL  Lipid panel  Result Value Ref Range   Cholesterol, Total 122 100 - 199 mg/dL   Triglycerides 76 0 - 149 mg/dL    HDL 38 (L) >39 mg/dL   VLDL Cholesterol Cal 15 5 - 40 mg/dL   LDL Chol Calc (NIH) 69 0 - 99 mg/dL   Chol/HDL Ratio 3.2 0.0 - 5.0 ratio  Thyroid Panel With TSH  Result Value Ref Range   TSH 0.961 0.450 - 4.500 uIU/mL   T4, Total 8.3 4.5 - 12.0 ug/dL   T3 Uptake Ratio 26 24 - 39 %   Free Thyroxine Index 2.2 1.2 - 4.9  Microalbumin / creatinine urine ratio  Result Value Ref Range   Creatinine, Urine 143.7 Not Estab. mg/dL   Microalbumin, Urine 10.7 Not Estab. ug/mL   Microalb/Creat Ratio 7 0 - 29 mg/g creat  PSA, total and free  Result Value Ref Range   Prostate Specific Ag, Serum <0.1 0.0 - 4.0 ng/mL   PSA, Free <0.02 N/A ng/mL   PSA, Free Pct CANCELED %       Pertinent labs & imaging results that were available during my care of the patient were reviewed by me and considered in my medical decision making.  Assessment & Plan:  Miley was seen today for hypertension.  Diagnoses and all orders for this visit:  Essential hypertension Pt reports BP readings averaging 125/75 at home. Pt did not restart the lisinopril/hctz but did restart the HCTZ 12.5 mg. Pt has been tolerating this well. Will recheck CMP today. Pt aware to continue amlodipine and HCTZ. Pt aware to continue to monitor BP at home and report any persistent high or low readings. Pt aware to bring medications to next OV. Pt aware to report return of dizziness.  -     hydrochlorothiazide (MICROZIDE) 12.5 MG capsule; Take 1 capsule (12.5 mg total) by mouth daily. -     CMP14+EGFR     Continue all other maintenance medications.  Follow up plan: Return in about 3 months (around 10/31/2019), or if symptoms worsen or fail to improve, for HTN.  Continue healthy lifestyle choices, including diet (rich in fruits, vegetables, and lean proteins, and low in salt and simple carbohydrates) and exercise (at least 30 minutes of moderate physical activity daily).  Educational handout given for DASH diet  The above assessment and  management plan was discussed with the patient. The patient verbalized understanding of and has agreed to the management plan. Patient is aware to call the clinic if they develop any new symptoms or if symptoms persist or worsen. Patient is aware when to return to the clinic for a follow-up visit. Patient educated on when it is appropriate to go to the emergency department.   Monia Pouch, FNP-C [facility] [phone]

## 2019-07-31 NOTE — Patient Instructions (Signed)
DASH Eating Plan DASH stands for "Dietary Approaches to Stop Hypertension." The DASH eating plan is a healthy eating plan that has been shown to reduce high blood pressure (hypertension). Additional health benefits may include reducing the risk of type 2 diabetes mellitus, heart disease, and stroke. The DASH eating plan may also help with weight loss.  WHAT DO I NEED TO KNOW ABOUT THE DASH EATING PLAN? For the DASH eating plan, you will follow these general guidelines:  Choose foods with a percent daily value for sodium of less than 5% (as listed on the food label).  Use salt-free seasonings or herbs instead of table salt or sea salt.  Check with your health care provider or pharmacist before using salt substitutes.  Eat lower-sodium products, often labeled as "lower sodium" or "no salt added."  Eat fresh foods.  Eat more vegetables, fruits, and low-fat dairy products.  Choose whole grains. Look for the word "whole" as the first word in the ingredient list.  Choose fish and skinless chicken or turkey more often than red meat. Limit fish, poultry, and meat to 6 oz (170 g) each day.  Limit sweets, desserts, sugars, and sugary drinks.  Choose heart-healthy fats.  Limit cheese to 1 oz (28 g) per day.  Eat more home-cooked food and less restaurant, buffet, and fast food.  Limit fried foods.  Cook foods using methods other than frying.  Limit canned vegetables. If you do use them, rinse them well to decrease the sodium.  When eating at a restaurant, ask that your food be prepared with less salt, or no salt if possible.  WHAT FOODS CAN I EAT? Seek help from a dietitian for individual calorie needs.  Grains Whole grain or whole wheat bread. Brown rice. Whole grain or whole wheat pasta. Quinoa, bulgur, and whole grain cereals. Low-sodium cereals. Corn or whole wheat flour tortillas. Whole grain cornbread. Whole grain crackers. Low-sodium crackers.  Vegetables Fresh or frozen  vegetables (raw, steamed, roasted, or grilled). Low-sodium or reduced-sodium tomato and vegetable juices. Low-sodium or reduced-sodium tomato sauce and paste. Low-sodium or reduced-sodium canned vegetables.   Fruits All fresh, canned (in natural juice), or frozen fruits.  Meat and Other Protein Products Ground beef (85% or leaner), grass-fed beef, or beef trimmed of fat. Skinless chicken or turkey. Ground chicken or turkey. Pork trimmed of fat. All fish and seafood. Eggs. Dried beans, peas, or lentils. Unsalted nuts and seeds. Unsalted canned beans.  Dairy Low-fat dairy products, such as skim or 1% milk, 2% or reduced-fat cheeses, low-fat ricotta or cottage cheese, or plain low-fat yogurt. Low-sodium or reduced-sodium cheeses.  Fats and Oils Tub margarines without trans fats. Light or reduced-fat mayonnaise and salad dressings (reduced sodium). Avocado. Safflower, olive, or canola oils. Natural peanut or almond butter.  Other Unsalted popcorn and pretzels. The items listed above may not be a complete list of recommended foods or beverages. Contact your dietitian for more options.  WHAT FOODS ARE NOT RECOMMENDED?  Grains White bread. White pasta. White rice. Refined cornbread. Bagels and croissants. Crackers that contain trans fat.  Vegetables Creamed or fried vegetables. Vegetables in a cheese sauce. Regular canned vegetables. Regular canned tomato sauce and paste. Regular tomato and vegetable juices.  Fruits Dried fruits. Canned fruit in light or heavy syrup. Fruit juice.  Meat and Other Protein Products Fatty cuts of meat. Ribs, chicken wings, bacon, sausage, bologna, salami, chitterlings, fatback, hot dogs, bratwurst, and packaged luncheon meats. Salted nuts and seeds. Canned beans with salt.    Dairy Whole or 2% milk, cream, half-and-half, and cream cheese. Whole-fat or sweetened yogurt. Full-fat cheeses or blue cheese. Nondairy creamers and whipped toppings. Processed cheese,  cheese spreads, or cheese curds.  Condiments Onion and garlic salt, seasoned salt, table salt, and sea salt. Canned and packaged gravies. Worcestershire sauce. Tartar sauce. Barbecue sauce. Teriyaki sauce. Soy sauce, including reduced sodium. Steak sauce. Fish sauce. Oyster sauce. Cocktail sauce. Horseradish. Ketchup and mustard. Meat flavorings and tenderizers. Bouillon cubes. Hot sauce. Tabasco sauce. Marinades. Taco seasonings. Relishes.  Fats and Oils Butter, stick margarine, lard, shortening, ghee, and bacon fat. Coconut, palm kernel, or palm oils. Regular salad dressings.  Other Pickles and olives. Salted popcorn and pretzels.  The items listed above may not be a complete list of foods and beverages to avoid. Contact your dietitian for more information.  WHERE CAN I FIND MORE INFORMATION? National Heart, Lung, and [employer] Institute: www.nhlbi.nih.gov/health/health-topics/topics/dash/ Document Released: 09/15/2011 Document Revised: 02/10/2014 Document Reviewed: 07/31/2013 ExitCare Patient Information 2015 [employer]. This information is not intended to replace advice given to you by your health care provider. Make sure you discuss any questions you have with your health care provider.   I think that you would greatly benefit from seeing a nutritionist.  If you are interested, please call Dr Sykes at [phone] to schedule an appointment.   

## 2019-08-01 LAB — CMP14+EGFR
ALT: 33 IU/L (ref 0–44)
AST: 26 IU/L (ref 0–40)
Albumin/Globulin Ratio: 2 (ref 1.2–2.2)
Albumin: 4.9 g/dL — ABNORMAL HIGH (ref 3.8–4.8)
Alkaline Phosphatase: 43 IU/L (ref 39–117)
BUN/Creatinine Ratio: 15 (ref 10–24)
BUN: 18 mg/dL (ref 8–27)
Bilirubin Total: 0.7 mg/dL (ref 0.0–1.2)
CO2: 29 mmol/L (ref 20–29)
Calcium: 11.3 mg/dL — ABNORMAL HIGH (ref 8.6–10.2)
Chloride: 106 mmol/L (ref 96–106)
Creatinine, Ser: 1.23 mg/dL (ref 0.76–1.27)
GFR calc Af Amer: 68 mL/min/{1.73_m2} (ref >59)
GFR calc non Af Amer: 59 mL/min/{1.73_m2} — ABNORMAL LOW (ref >59)
Globulin, Total: 2.4 g/dL (ref 1.5–4.5)
Glucose: 103 mg/dL — ABNORMAL HIGH (ref 65–99)
Potassium: 5.3 mmol/L — ABNORMAL HIGH (ref 3.5–5.2)
Sodium: 147 mmol/L — ABNORMAL HIGH (ref 134–144)
Total Protein: 7.3 g/dL (ref 6.0–8.5)

## 2019-08-01 NOTE — Addendum Note (Signed)
Addended by: Baruch Gouty on: 08/01/2019 09:13 AM   Modules accepted: Orders

## 2019-10-24 ENCOUNTER — Ambulatory Visit (INDEPENDENT_AMBULATORY_CARE_PROVIDER_SITE_OTHER): Payer: Medicare HMO | Admitting: Family Medicine

## 2019-10-24 ENCOUNTER — Encounter: Payer: Self-pay | Admitting: Family Medicine

## 2019-10-24 DIAGNOSIS — J302 Other seasonal allergic rhinitis: Secondary | ICD-10-CM

## 2019-10-24 NOTE — Patient Instructions (Signed)
Allergies, Adult An allergy is when your body's defense system (immune system) overreacts to an otherwise harmless substance (allergen) that you breathe in or eat or something that touches your skin. When you come into contact with something that you are allergic to, your immune system produces certain proteins (antibodies). These proteins cause cells to release chemicals (histamines) that trigger the symptoms of an allergic reaction. Allergies often affect the nasal passages (allergic rhinitis), eyes (allergic conjunctivitis), skin (atopic dermatitis), and stomach. Allergies can be mild or severe. Allergies cannot spread from person to person (are not contagious). They can develop at any age and may be outgrown. What increases the risk? You may be at greater risk of allergies if other people in your family have allergies. What are the signs or symptoms? Symptoms depend on what type of allergy you have. They may include:  Runny, stuffy nose.  Sneezing.  Itchy mouth, ears, or throat.  Postnasal drip.  Sore throat.  Itchy, red, watery, or puffy eyes.  Skin rash or hives.  Stomach pain.  Vomiting.  Diarrhea.  Bloating.  Wheezing or coughing. People with a severe allergy to food, medicine, or an insect bite may have a life-threatening allergic reaction (anaphylaxis). Symptoms of anaphylaxis include:  Hives.  Itching.  Flushed face.  Swollen lips, tongue, or mouth.  Tight or swollen throat.  Chest pain or tightness in the chest.  Trouble breathing or shortness of breath.  Rapid heartbeat.  Dizziness or fainting.  Vomiting.  Diarrhea.  Pain in the abdomen. How is this diagnosed? This condition is diagnosed based on:  Your symptoms.  Your family and medical history.  A physical exam. You may need to see a health care provider who specializes in treating allergies (allergist). You may also have tests, including:  Skin tests to see which allergens are causing  your symptoms, such as: ? Skin prick test. In this test, your skin is pricked with a tiny needle and exposed to small amounts of possible allergens to see if your skin reacts. ? Intradermal skin test. In this test, a small amount of allergen is injected under your skin to see if your skin reacts. ? Patch test. In this test, a small amount of allergen is placed on your skin and then your skin is covered with a bandage. Your health care provider will check your skin after a couple of days to see if a rash has developed.  Blood tests.  Challenges tests. In this test, you inhale a small amount of allergen by mouth to see if you have an allergic reaction. You may also be asked to:  Keep a food diary. A food diary is a record of all the foods and drinks you have in a day and any symptoms you experience.  Practice an elimination diet. An elimination diet involves eliminating specific foods from your diet and then adding them back in one by one to find out if a certain food causes an allergic reaction. How is this treated? Treatment for allergies depends on your symptoms. Treatment may include:  Cold compresses to soothe itching and swelling.  Eye drops.  Nasal sprays.  Using a saline spray or container (neti pot) to flush out the nose (nasal irrigation). These methods can help clear away mucus and keep the nasal passages moist.  Using a humidifier.  Oral antihistamines or other medicines to block allergic reaction and inflammation.  Skin creams to treat rashes or itching.  Diet changes to eliminate food allergy triggers.  Repeated exposure to tiny amounts of allergens to build up a tolerance and prevent future allergic reactions (immunotherapy). These include: °? Allergy shots. °? Oral treatment. This involves taking small doses of an allergen under the tongue (sublingual immunotherapy). °· Emergency epinephrine injection (auto-injector) in case of an allergic emergency. This is a  self-injectable, pre-measured medicine that must be given within the first few minutes of a serious allergic reaction. °Follow these instructions at home: ° °  ° °  ° °· Avoid known allergens whenever possible. °· If you suffer from airborne allergens, wash out your nose daily. You can do this with a saline spray or a neti pot to flush out your nose (nasal irrigation). °· Take over-the-counter and prescription medicines only as told by your health care provider. °· Keep all follow-up visits as told by your health care provider. This is important. °· If you are at risk of a severe allergic reaction (anaphylaxis), keep your auto-injector with you at all times. °· If you have ever had anaphylaxis, wear a medical alert bracelet or necklace that states you have a severe allergy. °Contact a health care provider if: °· Your symptoms do not improve with treatment. °Get help right away if: °· You have symptoms of anaphylaxis, such as: °? Swollen mouth, tongue, or throat. °? Pain or tightness in your chest. °? Trouble breathing or shortness of breath. °? Dizziness or fainting. °? Severe abdominal pain, vomiting, or diarrhea. °This information is not intended to replace advice given to you by your health care provider. Make sure you discuss any questions you have with your health care provider. °Document Revised: 12/20/2017 Document Reviewed: 04/13/2016 °Elsevier Patient Education © 2020 [employer] ° °

## 2019-10-24 NOTE — Progress Notes (Signed)
Virtual Visit via Telephone Note  I connected with Tommy [name] on 10/24/19 at 3:42 PM by telephone and verified that I am speaking with the correct person using two identifiers. Tommy [name] is currently located at home and nobody is currently with him during this visit. The provider, Loman Brooklyn, FNP is located in their office at time of visit.  I discussed the limitations, risks, security and privacy concerns of performing an evaluation and management service by telephone and the availability of in person appointments. I also discussed with the patient that there may be a patient responsible charge related to this service. The patient expressed understanding and agreed to proceed.  Subjective: PCP: [name], Fransisca Kaufmann, MD  Chief Complaint  Patient presents with  . URI   Patient complains of postnasal drainage, scratchy throat, and watery eyes. Onset of symptoms was today. He is drinking plenty of fluids. Evaluation to date: none. Treatment to date: none. He does not have a history of allergies, asthma, or COPD. He does not smoke.    ROS: Per HPI  Current Outpatient Medications:  .  amLODipine (NORVASC) 10 MG tablet, Take 1 tablet (10 mg total) by mouth daily., Disp: 90 tablet, Rfl: 3 .  clopidogrel (PLAVIX) 75 MG tablet, Take 1 tablet (75 mg total) by mouth daily., Disp: 90 tablet, Rfl: 3 .  esomeprazole (NEXIUM) 20 MG capsule, Take 1 capsule (20 mg total) by mouth daily at 12 noon., Disp: 30 capsule, Rfl: 5 .  fenofibrate (TRICOR) 145 MG tablet, Take 1 tablet (145 mg total) by mouth daily., Disp: 90 tablet, Rfl: 3 .  hydrochlorothiazide (MICROZIDE) 12.5 MG capsule, Take 1 capsule (12.5 mg total) by mouth daily., Disp: 90 capsule, Rfl: 1 .  rosuvastatin (CRESTOR) 10 MG tablet, Take 1 tablet (10 mg total) by mouth every evening., Disp: 90 tablet, Rfl: 3 .  tamsulosin (FLOMAX) 0.4 MG CAPS capsule, Take 1 capsule (0.4 mg total) by mouth daily after supper., Disp: 90 capsule, Rfl:  3  Allergies  Allergen Reactions  . Penicillins Swelling    Has patient had a PCN reaction causing immediate rash, facial/tongue/throat swelling, SOB or lightheadedness with hypotension: No Has patient had a PCN reaction causing severe rash involving mucus membranes or skin necrosis: No Has patient had a PCN reaction that required hospitalization No Has patient had a PCN reaction occurring within the last 10 years: No If all of the above answers are "NO", then may proceed with Cephalosporin use.   Past Medical History:  Diagnosis Date  . BPH with obstruction/lower urinary tract symptoms    hyperplasia  . ED (erectile dysfunction)    arterial insuff.  . History of DVT of lower extremity   . History of gastric ulcer   . Hyperlipidemia   . Hypertension   . Peripheral vascular disease ([place])   . Prostate cancer [facility]) DX 01/01/15--  urologist-  dr Jeffie Pollock   Stage T1c, Gleason 3+4, PSA 5.73, vol 80ml  . Wears glasses     Observations/Objective: A&O  No respiratory distress or wheezing audible over the phone Mood, judgement, and thought processes all WNL  Assessment and Plan: 1. Seasonal allergies - Encouraged to start Flonase 2 sprays in each nostril every morning and antihistamine such as Claritin, Zyrtec, Allegra, or Xyzal every evening. Education provided on allergies. Advised to let us know if he develops more symptoms or he does not improve.    Follow Up Instructions:  I discussed the assessment and treatment  plan with the patient. The patient was provided an opportunity to ask questions and all were answered. The patient agreed with the plan and demonstrated an understanding of the instructions.   The patient was advised to call back or seek an in-person evaluation if the symptoms worsen or if the condition fails to improve as anticipated.  The above assessment and management plan was discussed with the patient. The patient verbalized understanding of and has agreed to the  management plan. Patient is aware to call the clinic if symptoms persist or worsen. Patient is aware when to return to the clinic for a follow-up visit. Patient educated on when it is appropriate to go to the emergency department.   Time call ended: 3:50 PM  I provided 11 minutes of non-face-to-face time during this encounter.  Hendricks Limes, MSN, APRN, FNP-C [facility] 10/24/19

## 2019-10-30 ENCOUNTER — Telehealth: Payer: Self-pay | Admitting: Family Medicine

## 2019-10-30 NOTE — Telephone Encounter (Signed)
Appt scheduled per patients request ?

## 2019-10-31 ENCOUNTER — Ambulatory Visit: Payer: Medicare Other | Admitting: Family Medicine

## 2019-11-06 ENCOUNTER — Other Ambulatory Visit: Payer: Self-pay

## 2019-11-07 ENCOUNTER — Ambulatory Visit (INDEPENDENT_AMBULATORY_CARE_PROVIDER_SITE_OTHER): Payer: Medicare HMO | Admitting: Family Medicine

## 2019-11-07 ENCOUNTER — Encounter: Payer: Self-pay | Admitting: Family Medicine

## 2019-11-07 VITALS — BP 147/82 | HR 66 | Temp 99.1°F | Ht 71.0 in | Wt 191.4 lb

## 2019-11-07 DIAGNOSIS — E785 Hyperlipidemia, unspecified: Secondary | ICD-10-CM

## 2019-11-07 DIAGNOSIS — I739 Peripheral vascular disease, unspecified: Secondary | ICD-10-CM | POA: Diagnosis not present

## 2019-11-07 DIAGNOSIS — I10 Essential (primary) hypertension: Secondary | ICD-10-CM

## 2019-11-07 DIAGNOSIS — R7303 Prediabetes: Secondary | ICD-10-CM | POA: Diagnosis not present

## 2019-11-07 DIAGNOSIS — K219 Gastro-esophageal reflux disease without esophagitis: Secondary | ICD-10-CM

## 2019-11-07 DIAGNOSIS — N400 Enlarged prostate without lower urinary tract symptoms: Secondary | ICD-10-CM

## 2019-11-07 LAB — BAYER DCA HB A1C WAIVED: HB A1C (BAYER DCA - WAIVED): 5.3 % (ref <7.0)

## 2019-11-07 MED ORDER — CLOPIDOGREL BISULFATE 75 MG PO TABS: 75.0000 mg | ORAL_TABLET | Freq: Every day | ORAL | 3 refills | Status: DC

## 2019-11-07 MED ORDER — AMLODIPINE BESYLATE 10 MG PO TABS: 10.0000 mg | ORAL_TABLET | Freq: Every day | ORAL | 3 refills | Status: DC

## 2019-11-07 MED ORDER — HYDROCHLOROTHIAZIDE 25 MG PO TABS: 25.0000 mg | ORAL_TABLET | Freq: Every day | ORAL | 3 refills | Status: DC

## 2019-11-07 MED ORDER — TAMSULOSIN HCL 0.4 MG PO CAPS: ORAL_CAPSULE | ORAL | 3 refills | Status: DC

## 2019-11-07 MED ORDER — FENOFIBRATE 145 MG PO TABS: 145.0000 mg | ORAL_TABLET | Freq: Every day | ORAL | 3 refills | Status: DC

## 2019-11-07 MED ORDER — ROSUVASTATIN CALCIUM 10 MG PO TABS: 10.0000 mg | ORAL_TABLET | Freq: Every evening | ORAL | 3 refills | Status: DC

## 2019-11-07 NOTE — Progress Notes (Signed)
BP (!) 147/82   Pulse 66   Temp 99.1 F (37.3 C) (Temporal)   Ht _0  (1.803 m)   Wt 191 lb 6.4 oz (86.8 kg)   SpO2 95%   BMI 26.69 kg/m    Subjective:   Patient ID: Tommy [name], male    DOB: 12-Apr-1951, 69 y.o.   MRN: [id number]  HPI: Tommy [name] is a 69 y.o. male presenting on 11/07/2019 for Hypertension   HPI Hypertension Patient is currently on amlodipine and hydrochlorothiazide 12.5 mg, and their blood pressure today is 147/82. Patient denies any lightheadedness or dizziness. Patient denies headaches, blurred vision, chest pains, shortness of breath, or weakness. Denies any side effects from medication and is content with current medication.   Prediabetes Patient comes in today for recheck of his diabetes. Patient has been currently taking no medication currently we are monitoring, will check A1c today. Patient is not currently on an ACE inhibitor/ARB. Patient has not seen an ophthalmologist this year. Patient denies any issues with their feet.   Hyperlipidemia Patient is coming in for recheck of his hyperlipidemia. The patient is currently taking fenofibrate and Crestor. They deny any issues with myalgias or history of liver damage from it. They deny any focal numbness or weakness or chest pain.   GERD Patient is currently on no medication currently and has been doing fine off the Nexium that he used to be on.  She denies any major symptoms or abdominal pain or belching or burping. She denies any blood in her stool or lightheadedness or dizziness.   BPH Patient is coming in for recheck on BPH Symptoms: None currently Medication: Tamsulosin Last PSA: Less than 0.1 on 07/03/2019  Relevant past medical, surgical, family and social history reviewed and updated as indicated. Interim medical history since our last visit reviewed. Allergies and medications reviewed and updated.  Review of Systems  Constitutional: Negative for chills and fever.  Respiratory: Negative for  cough, shortness of breath and wheezing.   Cardiovascular: Negative for chest pain and leg swelling.  Musculoskeletal: Negative for back pain and gait problem.  Skin: Negative for rash.  Neurological: Negative for dizziness, weakness and light-headedness.  All other systems reviewed and are negative.   Per HPI unless specifically indicated above   Allergies as of 11/07/2019      Reactions   Penicillins Swelling   Has patient had a PCN reaction causing immediate rash, facial/tongue/throat swelling, SOB or lightheadedness with hypotension: No Has patient had a PCN reaction causing severe rash involving mucus membranes or skin necrosis: No Has patient had a PCN reaction that required hospitalization No Has patient had a PCN reaction occurring within the last 10 years: No If all of the above answers are "NO", then may proceed with Cephalosporin use.      Medication List       Accurate as of November 07, 2019 11:44 AM. If you have any questions, ask your nurse or doctor.        STOP taking these medications   esomeprazole 20 MG capsule Commonly known as: Zo[place] by: Fransisca Kaufmann , MD   hydrochlorothiazide 12.5 MG capsule Commonly known as: Microzide Replaced by: hydrochlorothiazide 25 MG tablet Stopped by: Fransisca Kaufmann , MD     TAKE these medications   amLODipine 10 MG tablet Commonly known as: NORVASC Take 1 tablet (10 mg total) by mouth daily.   clopidogrel 75 MG tablet Commonly known as: PLAVIX Take 1 tablet (75 mg  total) by mouth daily.   fenofibrate 145 MG tablet Commonly known as: TRICOR Take 1 tablet (145 mg total) by mouth daily.   hydrochlorothiazide 25 MG tablet Commonly known as: HYDRODIURIL Take 1 tablet (25 mg total) by mouth daily. Replaces: hydrochlorothiazide 12.5 MG capsule Started by: Fransisca Kaufmann , MD   rosuvastatin 10 MG tablet Commonly known as: CRESTOR Take 1 tablet (10 mg total) by mouth every evening.   tamsulosin  0.4 MG Caps capsule Commonly known as: FLOMAX Take 1 capsule (0.4 mg total) by mouth daily after supper.        Objective:   BP (!) 147/82   Pulse 66   Temp 99.1 F (37.3 C) (Temporal)   Ht _0  (1.803 m)   Wt 191 lb 6.4 oz (86.8 kg)   SpO2 95%   BMI 26.69 kg/m   Wt Readings from Last 3 Encounters:  11/07/19 191 lb 6.4 oz (86.8 kg)  07/31/19 188 lb (85.3 kg)  07/03/19 189 lb (85.7 kg)    Physical Exam Vitals and nursing note reviewed.  Constitutional:      General: He is not in acute distress.    Appearance: He is well-developed. He is not diaphoretic.  Eyes:     General: No scleral icterus.    Conjunctiva/sclera: Conjunctivae normal.  Neck:     Thyroid: No thyromegaly.  Cardiovascular:     Rate and Rhythm: Normal rate and regular rhythm.     Heart sounds: Normal heart sounds. No murmur.  Pulmonary:     Effort: Pulmonary effort is normal. No respiratory distress.     Breath sounds: Normal breath sounds. No wheezing.  Musculoskeletal:        General: No swelling. Normal range of motion.     Cervical back: Neck supple.  Lymphadenopathy:     Cervical: No cervical adenopathy.  Skin:    General: Skin is warm and dry.     Findings: No rash.  Neurological:     Mental Status: He is alert and oriented to person, place, and time.     Coordination: Coordination normal.  Psychiatric:        Behavior: Behavior normal.       Assessment & Plan:   Problem List Items Addressed This Visit      Cardiovascular and Mediastinum   PAD (peripheral artery disease) (HCC)   Relevant Medications   amLODipine (NORVASC) 10 MG tablet   fenofibrate (TRICOR) 145 MG tablet   rosuvastatin (CRESTOR) 10 MG tablet   clopidogrel (PLAVIX) 75 MG tablet   hydrochlorothiazide (HYDRODIURIL) 25 MG tablet   HTN (hypertension)   Relevant Medications   amLODipine (NORVASC) 10 MG tablet   fenofibrate (TRICOR) 145 MG tablet   rosuvastatin (CRESTOR) 10 MG tablet   hydrochlorothiazide  (HYDRODIURIL) 25 MG tablet   Other Relevant Orders   CBC with Differential/Platelet   CMP14+EGFR     Digestive   GERD without esophagitis   Relevant Orders   CBC with Differential/Platelet   CMP14+EGFR     Genitourinary   Benign prostatic hyperplasia   Relevant Medications   tamsulosin (FLOMAX) 0.4 MG CAPS capsule     Other   Hyperlipidemia LDL goal <130   Relevant Medications   amLODipine (NORVASC) 10 MG tablet   fenofibrate (TRICOR) 145 MG tablet   rosuvastatin (CRESTOR) 10 MG tablet   hydrochlorothiazide (HYDRODIURIL) 25 MG tablet   Other Relevant Orders   Lipid panel   Prediabetes - Primary   Relevant Orders  Bayer DCA Hb A1c Waived      No changes in current medication except that we increase the hydrochlorothiazide from 12.5 mg to 25 mg daily and will see if that helps with both the swelling that he is having intermittently and his blood pressure.  He will keep a close eye on blood pressures, return in 3 months Follow up plan: Return in about 3 months (around 02/05/2020), or if symptoms worsen or fail to improve, for Prediabetes and hypertension and cholesterol.  Counseling provided for all of the vaccine components Orders Placed This Encounter  Procedures  . Bayer DCA Hb A1c Waived  . CBC with Differential/Platelet  . CMP14+EGFR  . Lipid panel    Caryl Pina, MD [facility] 11/07/2019, 11:44 AM

## 2019-11-08 LAB — CBC WITH DIFFERENTIAL/PLATELET
Basophils Absolute: 0 10*3/uL (ref 0.0–0.2)
Basos: 1 %
EOS (ABSOLUTE): 0.1 10*3/uL (ref 0.0–0.4)
Eos: 3 %
Hematocrit: 39.9 % (ref 37.5–51.0)
Hemoglobin: 14.1 g/dL (ref 13.0–17.7)
Immature Grans (Abs): 0 10*3/uL (ref 0.0–0.1)
Immature Granulocytes: 1 %
Lymphocytes Absolute: 1.4 10*3/uL (ref 0.7–3.1)
Lymphs: 35 %
MCH: 29.4 pg (ref 26.6–33.0)
MCHC: 35.3 g/dL (ref 31.5–35.7)
MCV: 83 fL (ref 79–97)
Monocytes Absolute: 0.3 10*3/uL (ref 0.1–0.9)
Monocytes: 9 %
Neutrophils Absolute: 2 10*3/uL (ref 1.4–7.0)
Neutrophils: 51 %
Platelets: 214 10*3/uL (ref 150–450)
RBC: 4.79 x10E6/uL (ref 4.14–5.80)
RDW: 12.4 % (ref 11.6–15.4)
WBC: 3.9 10*3/uL (ref 3.4–10.8)

## 2019-11-08 LAB — LIPID PANEL
Chol/HDL Ratio: 3.2 ratio (ref 0.0–5.0)
Cholesterol, Total: 123 mg/dL (ref 100–199)
HDL: 38 mg/dL — ABNORMAL LOW (ref >39)
LDL Chol Calc (NIH): 73 mg/dL (ref 0–99)
Triglycerides: 55 mg/dL (ref 0–149)
VLDL Cholesterol Cal: 12 mg/dL (ref 5–40)

## 2019-11-08 LAB — CMP14+EGFR
ALT: 30 IU/L (ref 0–44)
AST: 24 IU/L (ref 0–40)
Albumin/Globulin Ratio: 2.1 (ref 1.2–2.2)
Albumin: 4.7 g/dL (ref 3.8–4.8)
Alkaline Phosphatase: 38 IU/L — ABNORMAL LOW (ref 39–117)
BUN/Creatinine Ratio: 19 (ref 10–24)
BUN: 20 mg/dL (ref 8–27)
Bilirubin Total: 0.8 mg/dL (ref 0.0–1.2)
CO2: 26 mmol/L (ref 20–29)
Calcium: 10.7 mg/dL — ABNORMAL HIGH (ref 8.6–10.2)
Chloride: 108 mmol/L — ABNORMAL HIGH (ref 96–106)
Creatinine, Ser: 1.07 mg/dL (ref 0.76–1.27)
GFR calc Af Amer: 81 mL/min/{1.73_m2} (ref >59)
GFR calc non Af Amer: 70 mL/min/{1.73_m2} (ref >59)
Globulin, Total: 2.2 g/dL (ref 1.5–4.5)
Glucose: 107 mg/dL — ABNORMAL HIGH (ref 65–99)
Potassium: 5.1 mmol/L (ref 3.5–5.2)
Sodium: 147 mmol/L — ABNORMAL HIGH (ref 134–144)
Total Protein: 6.9 g/dL (ref 6.0–8.5)

## 2020-02-05 ENCOUNTER — Ambulatory Visit: Payer: Medicare HMO | Admitting: Family Medicine

## 2020-02-21 ENCOUNTER — Encounter: Payer: Self-pay | Admitting: Family Medicine

## 2020-02-21 ENCOUNTER — Other Ambulatory Visit: Payer: Self-pay

## 2020-02-21 ENCOUNTER — Ambulatory Visit (INDEPENDENT_AMBULATORY_CARE_PROVIDER_SITE_OTHER): Payer: Medicare HMO | Admitting: Family Medicine

## 2020-02-21 VITALS — BP 139/77 | HR 96 | Temp 97.9°F | Ht 71.0 in | Wt 197.0 lb

## 2020-02-21 DIAGNOSIS — I10 Essential (primary) hypertension: Secondary | ICD-10-CM | POA: Diagnosis not present

## 2020-02-21 DIAGNOSIS — E785 Hyperlipidemia, unspecified: Secondary | ICD-10-CM | POA: Diagnosis not present

## 2020-02-21 DIAGNOSIS — R7303 Prediabetes: Secondary | ICD-10-CM

## 2020-02-21 DIAGNOSIS — K219 Gastro-esophageal reflux disease without esophagitis: Secondary | ICD-10-CM

## 2020-02-21 LAB — BAYER DCA HB A1C WAIVED: HB A1C (BAYER DCA - WAIVED): 6.1 % (ref <7.0)

## 2020-02-21 NOTE — Progress Notes (Signed)
BP 139/77   Pulse 96   Temp 97.9 F (36.6 C)   Ht 5\' 11"  (1.803 m)   Wt 197 lb (89.4 kg)   SpO2 96%   BMI 27.48 kg/m    Subjective:   Patient ID: Tommy [name], male    DOB: [birth year], 69 y.o.   MRN: [id number]  HPI: Tommy [name] is a 69 y.o. male presenting on 02/21/2020 for Medical Management of Chronic Issues, Diabetes, and Hypertension   HPI Prediabetes Patient comes in today for recheck of his diabetes. Patient has been currently taking no medicine, A1c is 6.1. Patient is not currently on an ACE inhibitor/ARB. Patient has not seen an ophthalmologist this year. Patient denies any issues with their feet. The symptom started onset as an adult hypertension and cholesterol ARE RELATED TO DM    Hypertension Patient is currently on amlodipine and hydrochlorothiazide, and their blood pressure today is 139/77. Patient denies any lightheadedness or dizziness. Patient denies headaches, blurred vision, chest pains, shortness of breath, or weakness. Denies any side effects from medication and is content with current medication.   Hyperlipidemia Patient is coming in for recheck of his hyperlipidemia. The patient is currently taking fenofibrate and Crestor. They deny any issues with myalgias or history of liver damage from it. They deny any focal numbness or weakness or chest pain.   Relevant past medical, surgical, family and social history reviewed and updated as indicated. Interim medical history since our last visit reviewed. Allergies and medications reviewed and updated.  Review of Systems  Constitutional: Negative for chills and fever.  Eyes: Negative for visual disturbance.  Respiratory: Negative for shortness of breath and wheezing.   Cardiovascular: Negative for chest pain and leg swelling.  Musculoskeletal: Negative for back pain and gait problem.  Skin: Negative for rash.  Neurological: Negative for dizziness, weakness and numbness.  All other systems reviewed and are  negative.   Per HPI unless specifically indicated above   Allergies as of 02/21/2020      Reactions   Penicillins Swelling   Has patient had a PCN reaction causing immediate rash, facial/tongue/throat swelling, SOB or lightheadedness with hypotension: No Has patient had a PCN reaction causing severe rash involving mucus membranes or skin necrosis: No Has patient had a PCN reaction that required hospitalization No Has patient had a PCN reaction occurring within the last 10 years: No If all of the above answers are "NO", then may proceed with Cephalosporin use.      Medication List       Accurate as of Feb 21, 2020  9:09 AM. If you have any questions, ask your nurse or doctor.        amLODipine 10 MG tablet Commonly known as: NORVASC Take 1 tablet (10 mg total) by mouth daily.   clopidogrel 75 MG tablet Commonly known as: PLAVIX Take 1 tablet (75 mg total) by mouth daily.   fenofibrate 145 MG tablet Commonly known as: TRICOR Take 1 tablet (145 mg total) by mouth daily.   hydrochlorothiazide 25 MG tablet Commonly known as: HYDRODIURIL Take 1 tablet (25 mg total) by mouth daily.   rosuvastatin 10 MG tablet Commonly known as: CRESTOR Take 1 tablet (10 mg total) by mouth every evening.   tamsulosin 0.4 MG Caps capsule Commonly known as: FLOMAX Take 1 capsule (0.4 mg total) by mouth daily after supper.        Objective:   BP 139/77   Pulse 96   Temp 97.9  F (36.6 C)   Ht 5\' 11"  (1.803 m)   Wt 197 lb (89.4 kg)   SpO2 96%   BMI 27.48 kg/m   Wt Readings from Last 3 Encounters:  02/21/20 197 lb (89.4 kg)  11/07/19 191 lb 6.4 oz (86.8 kg)  07/31/19 188 lb (85.3 kg)    Physical Exam Vitals and nursing note reviewed.  Constitutional:      General: He is not in acute distress.    Appearance: He is well-developed. He is not diaphoretic.  Eyes:     General: No scleral icterus.    Conjunctiva/sclera: Conjunctivae normal.  Neck:     Thyroid: No thyromegaly.   Cardiovascular:     Rate and Rhythm: Normal rate and regular rhythm.     Heart sounds: Normal heart sounds. No murmur.  Pulmonary:     Effort: Pulmonary effort is normal. No respiratory distress.     Breath sounds: Normal breath sounds. No wheezing.  Musculoskeletal:        General: No swelling.     Cervical back: Neck supple.  Lymphadenopathy:     Cervical: No cervical adenopathy.  Skin:    General: Skin is warm and dry.     Findings: No rash.  Neurological:     Mental Status: He is alert and oriented to person, place, and time.     Coordination: Coordination normal.  Psychiatric:        Behavior: Behavior normal.       Assessment & Plan:   Problem List Items Addressed This Visit      Cardiovascular and Mediastinum   HTN (hypertension)     Digestive   GERD without esophagitis     Other   Hyperlipidemia LDL goal <130   Prediabetes - Primary   Relevant Orders   Bayer DCA Hb A1c Waived      Continue current medication, A1c 6.1, even though he feels like his blood sugars are up a little bit the average is still looking good.  Just keep an eye on it. Follow up plan: Return in about 3 months (around 05/23/2020), or if symptoms worsen or fail to improve, for Prediabetes and hypertension recheck.  Counseling provided for all of the vaccine components Orders Placed This Encounter  Procedures  . Bayer [facility] Hb A1c [place], MD [facility] 02/21/2020, 9:09 AM

## 2020-05-26 ENCOUNTER — Encounter: Payer: Self-pay | Admitting: Family Medicine

## 2020-05-26 ENCOUNTER — Other Ambulatory Visit: Payer: Self-pay

## 2020-05-26 ENCOUNTER — Ambulatory Visit (INDEPENDENT_AMBULATORY_CARE_PROVIDER_SITE_OTHER): Payer: Medicare HMO | Admitting: Family Medicine

## 2020-05-26 VITALS — BP 144/88 | HR 60 | Temp 97.7°F | Ht 71.0 in | Wt 196.0 lb

## 2020-05-26 DIAGNOSIS — K219 Gastro-esophageal reflux disease without esophagitis: Secondary | ICD-10-CM

## 2020-05-26 DIAGNOSIS — E785 Hyperlipidemia, unspecified: Secondary | ICD-10-CM

## 2020-05-26 DIAGNOSIS — I10 Essential (primary) hypertension: Secondary | ICD-10-CM

## 2020-05-26 DIAGNOSIS — R7303 Prediabetes: Secondary | ICD-10-CM | POA: Diagnosis not present

## 2020-05-26 LAB — BAYER DCA HB A1C WAIVED: HB A1C (BAYER DCA - WAIVED): 6.3 % (ref <7.0)

## 2020-05-26 NOTE — Progress Notes (Signed)
BP (!) 144/88   Pulse 60   Temp 97.7 F (36.5 C)   Ht _0  (1.803 m)   Wt 196 lb (88.9 kg)   SpO2 96%   BMI 27.34 kg/m    Subjective:   Patient ID: Tommy [name], male    DOB: 06-08-1951, 69 y.o.   MRN: [id number]  HPI: Tommy [name] is a 69 y.o. male presenting on 05/26/2020 for Medical Management of Chronic Issues and Hypertension   HPI Hypertension Patient is currently on amlodipine and hydrochlorothiazide, and their blood pressure today is 144/88.  Patient has a occasional orthostatic dizziness that is transient but passes very quickly. Patient denies headaches, blurred vision, chest pains, shortness of breath, or weakness. Denies any side effects from medication and is content with current medication.   Prediabetes Patient comes in today for recheck of his diabetes. Patient has been currently taking no medication and been diet controlled. Patient is not currently on an ACE inhibitor/ARB. Patient has not seen an ophthalmologist this year. Patient denies any issues with their feet. The symptom started onset as an adult hyperlipidemia and hypertension ARE RELATED TO DM   Hyperlipidemia Patient is coming in for recheck of his hyperlipidemia. The patient is currently taking Crestor and fenofibrate. They deny any issues with myalgias or history of liver damage from it. They deny any focal numbness or weakness or chest pain.   Prostate cancer history, sees urology Patient is coming in for recheck on BPH Symptoms: None currently Medication: Tamsulosin Last PSA: September last year, sees urology for this  Relevant past medical, surgical, family and social history reviewed and updated as indicated. Interim medical history since our last visit reviewed. Allergies and medications reviewed and updated.  Review of Systems  Constitutional: Negative for chills and fever.  Respiratory: Negative for shortness of breath and wheezing.   Cardiovascular: Negative for chest pain and leg  swelling.  Musculoskeletal: Negative for back pain and gait problem.  Skin: Negative for rash.  Neurological: Positive for dizziness and light-headedness. Negative for weakness and headaches.  All other systems reviewed and are negative.   Per HPI unless specifically indicated above   Allergies as of 05/26/2020      Reactions   Penicillins Swelling   Has patient had a PCN reaction causing immediate rash, facial/tongue/throat swelling, SOB or lightheadedness with hypotension: No Has patient had a PCN reaction causing severe rash involving mucus membranes or skin necrosis: No Has patient had a PCN reaction that required hospitalization No Has patient had a PCN reaction occurring within the last 10 years: No If all of the above answers are "NO", then may proceed with Cephalosporin use.      Medication List       Accurate as of May 26, 2020  9:52 AM. If you have any questions, ask your nurse or doctor.        amLODipine 10 MG tablet Commonly known as: NORVASC Take 1 tablet (10 mg total) by mouth daily.   clopidogrel 75 MG tablet Commonly known as: PLAVIX Take 1 tablet (75 mg total) by mouth daily.   fenofibrate 145 MG tablet Commonly known as: TRICOR Take 1 tablet (145 mg total) by mouth daily.   hydrochlorothiazide 25 MG tablet Commonly known as: HYDRODIURIL Take 1 tablet (25 mg total) by mouth daily.   rosuvastatin 10 MG tablet Commonly known as: CRESTOR Take 1 tablet (10 mg total) by mouth every evening.   tamsulosin 0.4 MG Caps capsule Commonly  known as: FLOMAX Take 1 capsule (0.4 mg total) by mouth daily after supper.        Objective:   BP (!) 144/88   Pulse 60   Temp 97.7 F (36.5 C)   Ht _0  (1.803 m)   Wt 196 lb (88.9 kg)   SpO2 96%   BMI 27.34 kg/m   Wt Readings from Last 3 Encounters:  05/26/20 196 lb (88.9 kg)  02/21/20 197 lb (89.4 kg)  11/07/19 191 lb 6.4 oz (86.8 kg)    Physical Exam Vitals and nursing note reviewed.   Constitutional:      General: He is not in acute distress.    Appearance: He is well-developed. He is not diaphoretic.  Eyes:     General: No scleral icterus.    Conjunctiva/sclera: Conjunctivae normal.  Neck:     Thyroid: No thyromegaly.  Cardiovascular:     Rate and Rhythm: Normal rate and regular rhythm.     Heart sounds: Normal heart sounds. No murmur heard.   Pulmonary:     Effort: Pulmonary effort is normal. No respiratory distress.     Breath sounds: Normal breath sounds. No wheezing.  Musculoskeletal:        General: Normal range of motion.     Cervical back: Neck supple.  Lymphadenopathy:     Cervical: No cervical adenopathy.  Skin:    General: Skin is warm and dry.     Findings: No rash.  Neurological:     Mental Status: He is alert and oriented to person, place, and time.     Coordination: Coordination normal.  Psychiatric:        Behavior: Behavior normal.       Assessment & Plan:   Problem List Items Addressed This Visit      Cardiovascular and Mediastinum   HTN (hypertension) - Primary   Relevant Orders   CMP14+EGFR     Digestive   GERD without esophagitis   Relevant Orders   CBC with Differential/Platelet     Other   Hyperlipidemia LDL goal <130   Relevant Orders   Lipid panel   Prediabetes   Relevant Orders   Bayer DCA Hb A1c Waived      Recheck prediabetes and lightheadedness and dizziness and blood pressure, gets the occasional orthostatic dizziness, he will call back in a couple weeks with some blood pressures see if it is going to low, especially with the heat, may have to back off on the hydrochlorothiazide. Follow up plan: Return in about 3 months (around 08/26/2020), or if symptoms worsen or fail to improve, for Hypertension and prediabetes recheck.  Counseling provided for all of the vaccine components No orders of the defined types were placed in this encounter.   Caryl Pina, MD [facility] 05/26/2020, 9:52 AM

## 2020-05-27 LAB — LIPID PANEL
Chol/HDL Ratio: 3.9 ratio (ref 0.0–5.0)
Cholesterol, Total: 136 mg/dL (ref 100–199)
HDL: 35 mg/dL — ABNORMAL LOW (ref >39)
LDL Chol Calc (NIH): 79 mg/dL (ref 0–99)
Triglycerides: 120 mg/dL (ref 0–149)
VLDL Cholesterol Cal: 22 mg/dL (ref 5–40)

## 2020-05-27 LAB — CBC WITH DIFFERENTIAL/PLATELET
Basophils Absolute: 0 10*3/uL (ref 0.0–0.2)
Basos: 1 %
EOS (ABSOLUTE): 0.2 10*3/uL (ref 0.0–0.4)
Eos: 4 %
Hematocrit: 42 % (ref 37.5–51.0)
Hemoglobin: 14.7 g/dL (ref 13.0–17.7)
Immature Grans (Abs): 0 10*3/uL (ref 0.0–0.1)
Immature Granulocytes: 1 %
Lymphocytes Absolute: 1.5 10*3/uL (ref 0.7–3.1)
Lymphs: 32 %
MCH: 30.1 pg (ref 26.6–33.0)
MCHC: 35 g/dL (ref 31.5–35.7)
MCV: 86 fL (ref 79–97)
Monocytes Absolute: 0.4 10*3/uL (ref 0.1–0.9)
Monocytes: 9 %
Neutrophils Absolute: 2.6 10*3/uL (ref 1.4–7.0)
Neutrophils: 53 %
Platelets: 211 10*3/uL (ref 150–450)
RBC: 4.89 x10E6/uL (ref 4.14–5.80)
RDW: 12.1 % (ref 11.6–15.4)
WBC: 4.8 10*3/uL (ref 3.4–10.8)

## 2020-05-27 LAB — CMP14+EGFR
ALT: 34 IU/L (ref 0–44)
AST: 26 IU/L (ref 0–40)
Albumin/Globulin Ratio: 1.8 (ref 1.2–2.2)
Albumin: 4.6 g/dL (ref 3.7–4.7)
Alkaline Phosphatase: 38 IU/L — ABNORMAL LOW (ref 48–121)
BUN/Creatinine Ratio: 13 (ref 10–24)
BUN: 16 mg/dL (ref 8–27)
Bilirubin Total: 0.9 mg/dL (ref 0.0–1.2)
CO2: 28 mmol/L (ref 20–29)
Calcium: 11.1 mg/dL — ABNORMAL HIGH (ref 8.6–10.2)
Chloride: 102 mmol/L (ref 96–106)
Creatinine, Ser: 1.23 mg/dL (ref 0.76–1.27)
GFR calc Af Amer: 68 mL/min/{1.73_m2} (ref >59)
GFR calc non Af Amer: 59 mL/min/{1.73_m2} — ABNORMAL LOW (ref >59)
Globulin, Total: 2.5 g/dL (ref 1.5–4.5)
Glucose: 125 mg/dL — ABNORMAL HIGH (ref 65–99)
Potassium: 5.1 mmol/L (ref 3.5–5.2)
Sodium: 144 mmol/L (ref 134–144)
Total Protein: 7.1 g/dL (ref 6.0–8.5)

## 2020-07-24 ENCOUNTER — Ambulatory Visit: Payer: Medicare HMO

## 2020-07-30 DIAGNOSIS — Z23 Encounter for immunization: Secondary | ICD-10-CM | POA: Diagnosis not present

## 2020-08-26 ENCOUNTER — Ambulatory Visit: Payer: Medicare HMO | Admitting: Family Medicine

## 2020-10-29 ENCOUNTER — Ambulatory Visit (INDEPENDENT_AMBULATORY_CARE_PROVIDER_SITE_OTHER): Payer: Medicare HMO | Admitting: Family Medicine

## 2020-10-29 ENCOUNTER — Encounter: Payer: Self-pay | Admitting: Family Medicine

## 2020-10-29 ENCOUNTER — Other Ambulatory Visit: Payer: Self-pay

## 2020-10-29 VITALS — BP 131/71 | HR 71 | Ht 71.0 in | Wt 198.0 lb

## 2020-10-29 DIAGNOSIS — I10 Essential (primary) hypertension: Secondary | ICD-10-CM

## 2020-10-29 DIAGNOSIS — Z1211 Encounter for screening for malignant neoplasm of colon: Secondary | ICD-10-CM | POA: Diagnosis not present

## 2020-10-29 DIAGNOSIS — K219 Gastro-esophageal reflux disease without esophagitis: Secondary | ICD-10-CM | POA: Diagnosis not present

## 2020-10-29 DIAGNOSIS — R7303 Prediabetes: Secondary | ICD-10-CM

## 2020-10-29 DIAGNOSIS — E785 Hyperlipidemia, unspecified: Secondary | ICD-10-CM | POA: Diagnosis not present

## 2020-10-29 NOTE — Progress Notes (Signed)
BP 131/71   Pulse 71   Ht _0  (1.803 m)   Wt 198 lb (89.8 kg)   SpO2 96%   BMI 27.62 kg/m    Subjective:   Patient ID: Tommy [name], male    DOB: [birth year], 70 y.o.   MRN: [id number]  HPI: Tommy [name] is a 70 y.o. male presenting on 10/29/2020 for Medical Management of Chronic Issues and Hypertension   HPI Prediabetes Patient comes in today for recheck of his diabetes. Patient has been currently taking diet controlled, A1c he will come in and do tomorrow. Patient is not currently on an ACE inhibitor/ARB. Patient has not seen an ophthalmologist this year. Patient denies any issues with their feet. The symptom started onset as an adult hypertension and hyperlipidemia and GERD ARE RELATED TO DM   Hypertension Patient is currently on hydrochlorothiazide and amlodipine, and their blood pressure today is 131/71. Patient denies any lightheadedness or dizziness. Patient denies headaches, blurred vision, chest pains, shortness of breath, or weakness. Denies any side effects from medication and is content with current medication.   Hyperlipidemia Patient is coming in for recheck of his hyperlipidemia. The patient is currently taking Crestor and fenofibrate. They deny any issues with myalgias or history of liver damage from it. They deny any focal numbness or weakness or chest pain.   GERD Patient is currently on no medication currently feels like things are going well on it..  She denies any major symptoms or abdominal pain or belching or burping. She denies any blood in her stool or lightheadedness or dizziness.   Relevant past medical, surgical, family and social history reviewed and updated as indicated. Interim medical history since our last visit reviewed. Allergies and medications reviewed and updated.  Review of Systems  Constitutional: Negative for chills and fever.  Eyes: Negative for visual disturbance.  Respiratory: Negative for shortness of breath and wheezing.    Cardiovascular: Negative for chest pain and leg swelling.  Musculoskeletal: Negative for back pain and gait problem.  Skin: Negative for rash.  Neurological: Negative for dizziness, weakness and numbness.  All other systems reviewed and are negative.   Per HPI unless specifically indicated above   Allergies as of 10/29/2020      Reactions   Penicillins Swelling   Has patient had a PCN reaction causing immediate rash, facial/tongue/throat swelling, SOB or lightheadedness with hypotension: No Has patient had a PCN reaction causing severe rash involving mucus membranes or skin necrosis: No Has patient had a PCN reaction that required hospitalization No Has patient had a PCN reaction occurring within the last 10 years: No If all of the above answers are "NO", then may proceed with Cephalosporin use.      Medication List       Accurate as of October 29, 2020  1:18 PM. If you have any questions, ask your nurse or doctor.        amLODipine 10 MG tablet Commonly known as: NORVASC Take 1 tablet (10 mg total) by mouth daily.   clopidogrel 75 MG tablet Commonly known as: PLAVIX Take 1 tablet (75 mg total) by mouth daily.   fenofibrate 145 MG tablet Commonly known as: TRICOR Take 1 tablet (145 mg total) by mouth daily.   hydrochlorothiazide 25 MG tablet Commonly known as: HYDRODIURIL Take 1 tablet (25 mg total) by mouth daily.   rosuvastatin 10 MG tablet Commonly known as: CRESTOR Take 1 tablet (10 mg total) by mouth every evening.  tamsulosin 0.4 MG Caps capsule Commonly known as: FLOMAX Take 1 capsule (0.4 mg total) by mouth daily after supper.        Objective:   BP 131/71   Pulse 71   Ht _0  (1.803 m)   Wt 198 lb (89.8 kg)   SpO2 96%   BMI 27.62 kg/m   Wt Readings from Last 3 Encounters:  10/29/20 198 lb (89.8 kg)  05/26/20 196 lb (88.9 kg)  02/21/20 197 lb (89.4 kg)    Physical Exam Vitals and nursing note reviewed.  Constitutional:      General:  He is not in acute distress.    Appearance: He is well-developed and well-nourished. He is not diaphoretic.  Eyes:     General: No scleral icterus.    Extraocular Movements: EOM normal.     Conjunctiva/sclera: Conjunctivae normal.  Neck:     Thyroid: No thyromegaly.  Cardiovascular:     Rate and Rhythm: Normal rate and regular rhythm.     Pulses: Intact distal pulses.     Heart sounds: Normal heart sounds. No murmur heard.   Pulmonary:     Effort: Pulmonary effort is normal. No respiratory distress.     Breath sounds: Normal breath sounds. No wheezing.  Musculoskeletal:        General: No edema. Normal range of motion.     Cervical back: Neck supple.  Lymphadenopathy:     Cervical: No cervical adenopathy.  Skin:    General: Skin is warm and dry.     Findings: No rash.  Neurological:     Mental Status: He is alert and oriented to person, place, and time.     Coordination: Coordination normal.  Psychiatric:        Mood and Affect: Mood and affect normal.        Behavior: Behavior normal.       Assessment & Plan:   Problem List Items Addressed This Visit      Cardiovascular and Mediastinum   HTN (hypertension)   Relevant Orders   CMP14+EGFR     Digestive   GERD without esophagitis   Relevant Orders   CBC with Differential/Platelet     Other   Hyperlipidemia LDL goal <130   Relevant Orders   CMP14+EGFR   Lipid panel   Prediabetes - Primary   Relevant Orders   CBC with Differential/Platelet   Bayer DCA Hb A1c Waived    Other Visit Diagnoses    Colon cancer screening       Relevant Orders   Ambulatory referral to Gastroenterology      Continue current medication, no changes, patient will come in and do blood work Architectural technologist. Follow up plan: Return in about 6 months (around 04/28/2021), or if symptoms worsen or fail to improve, for Hypertension and cholesterol and prostate follow-up.  Counseling provided for all of the vaccine components No orders of the  defined types were placed in this encounter.   Caryl Pina, MD [facility] 10/29/2020, 1:18 PM

## 2020-10-30 ENCOUNTER — Other Ambulatory Visit: Payer: Self-pay

## 2020-10-30 ENCOUNTER — Other Ambulatory Visit: Payer: Medicare HMO

## 2020-10-30 DIAGNOSIS — E785 Hyperlipidemia, unspecified: Secondary | ICD-10-CM

## 2020-10-30 DIAGNOSIS — K219 Gastro-esophageal reflux disease without esophagitis: Secondary | ICD-10-CM

## 2020-10-30 DIAGNOSIS — R7303 Prediabetes: Secondary | ICD-10-CM | POA: Diagnosis not present

## 2020-10-30 DIAGNOSIS — I10 Essential (primary) hypertension: Secondary | ICD-10-CM | POA: Diagnosis not present

## 2020-10-30 LAB — BAYER DCA HB A1C WAIVED: HB A1C (BAYER DCA - WAIVED): 6 % (ref <7.0)

## 2020-10-31 ENCOUNTER — Other Ambulatory Visit: Payer: Self-pay | Admitting: Family Medicine

## 2020-10-31 DIAGNOSIS — N400 Enlarged prostate without lower urinary tract symptoms: Secondary | ICD-10-CM

## 2020-10-31 DIAGNOSIS — I10 Essential (primary) hypertension: Secondary | ICD-10-CM

## 2020-10-31 LAB — CBC WITH DIFFERENTIAL/PLATELET
Basophils Absolute: 0 10*3/uL (ref 0.0–0.2)
Basos: 1 %
EOS (ABSOLUTE): 0.1 10*3/uL (ref 0.0–0.4)
Eos: 4 %
Hematocrit: 41.2 % (ref 37.5–51.0)
Hemoglobin: 14.4 g/dL (ref 13.0–17.7)
Immature Grans (Abs): 0 10*3/uL (ref 0.0–0.1)
Immature Granulocytes: 1 %
Lymphocytes Absolute: 1.4 10*3/uL (ref 0.7–3.1)
Lymphs: 37 %
MCH: 29.4 pg (ref 26.6–33.0)
MCHC: 35 g/dL (ref 31.5–35.7)
MCV: 84 fL (ref 79–97)
Monocytes Absolute: 0.3 10*3/uL (ref 0.1–0.9)
Monocytes: 8 %
Neutrophils Absolute: 1.8 10*3/uL (ref 1.4–7.0)
Neutrophils: 49 %
Platelets: 215 10*3/uL (ref 150–450)
RBC: 4.89 x10E6/uL (ref 4.14–5.80)
RDW: 11.9 % (ref 11.6–15.4)
WBC: 3.6 10*3/uL (ref 3.4–10.8)

## 2020-10-31 LAB — CMP14+EGFR
ALT: 35 IU/L (ref 0–44)
AST: 28 IU/L (ref 0–40)
Albumin/Globulin Ratio: 1.9 (ref 1.2–2.2)
Albumin: 4.4 g/dL (ref 3.7–4.7)
Alkaline Phosphatase: 33 IU/L — ABNORMAL LOW (ref 44–121)
BUN/Creatinine Ratio: 16 (ref 10–24)
BUN: 19 mg/dL (ref 8–27)
Bilirubin Total: 0.7 mg/dL (ref 0.0–1.2)
CO2: 25 mmol/L (ref 20–29)
Calcium: 11.1 mg/dL — ABNORMAL HIGH (ref 8.6–10.2)
Chloride: 104 mmol/L (ref 96–106)
Creatinine, Ser: 1.17 mg/dL (ref 0.76–1.27)
GFR calc Af Amer: 72 mL/min/{1.73_m2} (ref >59)
GFR calc non Af Amer: 62 mL/min/{1.73_m2} (ref >59)
Globulin, Total: 2.3 g/dL (ref 1.5–4.5)
Glucose: 111 mg/dL — ABNORMAL HIGH (ref 65–99)
Potassium: 5.4 mmol/L — ABNORMAL HIGH (ref 3.5–5.2)
Sodium: 143 mmol/L (ref 134–144)
Total Protein: 6.7 g/dL (ref 6.0–8.5)

## 2020-10-31 LAB — LIPID PANEL
Chol/HDL Ratio: 3.3 ratio (ref 0.0–5.0)
Cholesterol, Total: 117 mg/dL (ref 100–199)
HDL: 36 mg/dL — ABNORMAL LOW (ref >39)
LDL Chol Calc (NIH): 64 mg/dL (ref 0–99)
Triglycerides: 89 mg/dL (ref 0–149)
VLDL Cholesterol Cal: 17 mg/dL (ref 5–40)

## 2020-11-03 ENCOUNTER — Encounter: Payer: Self-pay | Admitting: Internal Medicine

## 2020-12-03 DIAGNOSIS — H524 Presbyopia: Secondary | ICD-10-CM | POA: Diagnosis not present

## 2020-12-03 DIAGNOSIS — Z0100 Encounter for examination of eyes and vision without abnormal findings: Secondary | ICD-10-CM | POA: Diagnosis not present

## 2020-12-16 ENCOUNTER — Encounter: Payer: Self-pay | Admitting: Nurse Practitioner

## 2020-12-16 ENCOUNTER — Ambulatory Visit: Payer: BLUE CROSS/BLUE SHIELD | Admitting: Nurse Practitioner

## 2020-12-16 ENCOUNTER — Other Ambulatory Visit: Payer: Self-pay

## 2020-12-16 ENCOUNTER — Telehealth: Payer: Self-pay

## 2020-12-16 ENCOUNTER — Encounter: Payer: Self-pay | Admitting: *Deleted

## 2020-12-16 DIAGNOSIS — Z860100 Personal history of colon polyps, unspecified: Secondary | ICD-10-CM | POA: Insufficient documentation

## 2020-12-16 MED ORDER — PEG 3350-KCL-NA BICARB-NACL 420 G PO SOLR: ORAL | 0 refills | Status: DC

## 2020-12-16 NOTE — Telephone Encounter (Signed)
Noted, routing message

## 2020-12-16 NOTE — Patient Instructions (Signed)
Your health issues we discussed today were:   Previous colon polyps: 1. We will schedule your colonoscopy for you 2. We will check with your primary care doctor for the okay to hold your Plavix for 5 days prior to your procedure 3. We will contact you when we get this okay to hold your Plavix 4. Further recommendations will follow your colonoscopy 5. Because of you have any problems or questions about your colon prep  Overall I recommend:  1. Continue other current medications 2. Return for follow-up based on recommendations made after colonoscopy 3. Call us for any questions or concerns   ---------------------------------------------------------------  I am glad you have gotten your COVID-19 vaccination!  Even though you are fully vaccinated you should continue to follow CDC and state/local guidelines.  ---------------------------------------------------------------   At [facility] we value your feedback. You may receive a survey about your visit today. Please share your experience as we strive to create trusting relationships with our patients to provide genuine, compassionate, quality care.  We appreciate your understanding and patience as we review any laboratory studies, imaging, and other diagnostic tests that are ordered as we care for you. Our office policy is 5 business days for review of these results, and any emergent or urgent results are addressed in a timely manner for your best interest. If you do not hear from our office in 1 week, please contact us.   We also encourage the use of MyChart, which contains your medical information for your review as well. If you are not enrolled in this feature, an access code is on this after visit summary for your convenience. Thank you for allowing [place] to be involved in your care.  It was great to see you today!  I hope you have a great spring!!

## 2020-12-16 NOTE — Progress Notes (Signed)
Primary Care Physician:  [name], Fransisca Kaufmann, MD Primary Gastroenterologist:  Dr. Abbey Chatters  Chief Complaint  Patient presents with  . Colonoscopy    Last tcs >10 yrs ago in [place]. Doing ok    HPI:   Tommy [name] is a 70 y.o. male who presents on referral from primary care to schedule colonoscopy.  Reviewed information associated with referral including primary care office visit dated 10/29/2020.  Noted history of GERD, doing well without medication.  Noted due for colon cancer screening and recommended referral to GI.  No history of colonoscopy found in our system.  Today he states he doing okay overall. Today he states he had a colonoscopy about or just over 10 years ago at [facility]; unable to locate report. States he had a few polyps that were "non-cancerous" and recommended repeating in 5-6 years. However, his doctor left and he has fallen through the cracks. Denies abdominal pain, N/V, hematochezia, melena, fever, chills, unintentional weight los. Denies URI or flu-like symptoms. Denies loss of sense of taste or smell. The patient has received COVID-19 vaccination(s). They have had a booster dose as well. Denies chest pain, dyspnea, dizziness, lightheadedness, syncope, near syncope. Denies any other upper or lower GI symptoms.  He is on Plavix for PVD s/p stenting. Last stent was 3 years ago. This is managed by Dr. Warrick Parisian.  Past Medical History:  Diagnosis Date  . BPH with obstruction/lower urinary tract symptoms    hyperplasia  . ED (erectile dysfunction)    arterial insuff.  . History of DVT of lower extremity   . History of gastric ulcer   . Hyperlipidemia   . Hypertension   . Peripheral vascular disease ([place])   . Prostate cancer [facility]) DX 01/01/15--  urologist-  dr Jeffie Pollock   Stage T1c, Gleason 3+4, PSA 5.73, vol 16m  . Wears glasses     Past Surgical History:  Procedure Laterality Date  . FEMORAL ARTERY STENT Left 07-22-2008  &  11-16-2010   left SFA//   In-Stent  restenosis  . ORIF LEFT INDEX FINGER FX  09-28-2006  . PROSTATE BIOPSY  01/01/15  . PROSTATE SURGERY  July 2016   Seed Implant  . RADIOACTIVE SEED IMPLANT N/A 04/23/2015   Procedure: RADIOACTIVE SEED IMPLANT/BRACHYTHERAPY IMPLANT;  Surgeon: JIrine Seal MD;  [facility]  Service: Urology;  Laterality: N/A;  . TONSILLECTOMY  as child    Current Outpatient Medications  Medication Sig Dispense Refill  . amLODipine (NORVASC) 10 MG tablet TAKE 1 TABLET EVERY DAY 90 tablet 1  . clopidogrel (PLAVIX) 75 MG tablet Take 1 tablet (75 mg total) by mouth daily. 90 tablet 3  . fenofibrate (TRICOR) 145 MG tablet Take 1 tablet (145 mg total) by mouth daily. 90 tablet 3  . hydrochlorothiazide (HYDRODIURIL) 25 MG tablet TAKE 1 TABLET EVERY DAY 90 tablet 1  . rosuvastatin (CRESTOR) 10 MG tablet Take 1 tablet (10 mg total) by mouth every evening. 90 tablet 3  . tamsulosin (FLOMAX) 0.4 MG CAPS capsule TAKE 1 CAPSULE BY MOUTH DAILY AFTER SUPPER. 90 capsule 1   No current facility-administered medications for this visit.    Allergies as of 12/16/2020 - Review Complete 12/16/2020  Allergen Reaction Noted  . Penicillins Swelling 02/09/2011    Family History  Problem Relation Age of Onset  . Heart disease Mother   . Cancer Mother   . Deep vein thrombosis Mother   . Diabetes Mother   . Hyperlipidemia Mother   .  Hypertension Mother   . Heart attack Mother   . Peripheral vascular disease Mother   . Cancer Father   . Colon cancer Neg Hx     Social History   Socioeconomic History  . Marital status: Married    Spouse name: Not on file  . Number of children: Not on file  . Years of education: Not on file  . Highest education level: Not on file  Occupational History  . Not on file  Tobacco Use  . Smoking status: Former Smoker    Packs/day: 1.00    Years: 15.00    Pack years: 15.00    Types: Cigarettes    Start date: 09/09/2010    Quit date: 04/21/2011    Years since  quitting: 9.6  . Smokeless tobacco: Current User    Types: Snuff  . Tobacco comment: occasional  someday dips tobacco  Vaping Use  . Vaping Use: Never used  Substance and Sexual Activity  . Alcohol use: No    Alcohol/week: 0.0 standard drinks  . Drug use: No  . Sexual activity: Not on file  Other Topics Concern  . Not on file  Social History Narrative  . Not on file   Social Determinants of Health   Financial Resource Strain: Not on file  Food Insecurity: Not on file  Transportation Needs: Not on file  Physical Activity: Not on file  Stress: Not on file  Social Connections: Not on file  Intimate Partner Violence: Not on file    Subjective: Review of Systems  Constitutional: Negative for chills, fever, malaise/fatigue and weight loss.  HENT: Negative for congestion and sore throat.   Respiratory: Negative for cough and shortness of breath.   Cardiovascular: Negative for chest pain and palpitations.  Gastrointestinal: Negative for abdominal pain, blood in stool, constipation, diarrhea, heartburn, melena, nausea and vomiting.  Musculoskeletal: Negative for joint pain and myalgias.  Skin: Negative for rash.  Neurological: Negative for dizziness and weakness.  Endo/Heme/Allergies: Does not bruise/bleed easily.  Psychiatric/Behavioral: Negative for depression. The patient is not nervous/anxious.   All other systems reviewed and are negative.      Objective: BP (!) 157/83   Pulse 69   Temp (!) 96.3 F (35.7 C) (Temporal)   Ht _0  (1.803 m)   Wt 197 lb 12.8 oz (89.7 kg)   BMI 27.59 kg/m  Physical Exam Vitals and nursing note reviewed.  Constitutional:      General: He is not in acute distress.    Appearance: Normal appearance. He is normal weight. He is not ill-appearing, toxic-appearing or diaphoretic.  HENT:     Head: Normocephalic and atraumatic.     Nose: No congestion or rhinorrhea.  Eyes:     General: No scleral icterus. Cardiovascular:     Rate and  Rhythm: Normal rate and regular rhythm.     Heart sounds: Normal heart sounds.  Pulmonary:     Effort: Pulmonary effort is normal.     Breath sounds: Normal breath sounds.  Abdominal:     General: Bowel sounds are normal. There is no distension.     Palpations: Abdomen is soft. There is no hepatomegaly, splenomegaly or mass.     Tenderness: There is no abdominal tenderness. There is no guarding or rebound.     Hernia: No hernia is present.  Musculoskeletal:     Cervical back: Neck supple.  Skin:    General: Skin is warm and dry.     Coloration: Skin is  not jaundiced.     Findings: No bruising or rash.  Neurological:     General: No focal deficit present.     Mental Status: He is alert and oriented to person, place, and time. Mental status is at baseline.  Psychiatric:        Mood and Affect: Mood normal.        Behavior: Behavior normal.        Thought Content: Thought content normal.      Assessment:  Very pleasant 70 year old male presents to schedule colonoscopy for history of colon polyps.  States his last colonoscopy was at or just over 10 years ago.  He states they found a few polyps and was recommended to repeat his colonoscopy in 5 years but he fell to the cracks.  He is generally asymptomatic from a GI standpoint today.  No red flag/warning signs or symptoms.  At this point we will proceed with a colonoscopy for surveillance purposes.  He is on Plavix and this will need to be held prior to his procedure after contacting his primary care.  Proceed with TCS on propofol/MAC with Dr. Abbey Chatters on propofol/MAC in near future: the risks, benefits, and alternatives have been discussed with the patient in detail. The patient states understanding and desires to proceed.  ASA III (PVD s/p stenting on Plavix)   Plan: 1. Colonoscopy as described above 2. Follow-up based on post procedure recommendations    Thank you for allowing [place] to participate in the care of Saddie Benders, DNP, AGNP-C Adult & Gerontological Nurse Practitioner [facility]   12/16/2020 10:45 AM   Disclaimer: This note was dictated with voice recognition software. Similar sounding words can inadvertently be transcribed and may not be corrected upon review.

## 2020-12-16 NOTE — Telephone Encounter (Signed)
Dr. Warrick Parisian, pt was seen in our office today by Walden Field, NP. Pt needs to have a Colonoscopy. Is it ok for pt to hold Plavix 5 days prior to his procedure? Please advise.

## 2020-12-16 NOTE — Telephone Encounter (Signed)
Yes okay to go ahead and hold the Plavix for 5 days before, start back up the day after.

## 2020-12-16 NOTE — Telephone Encounter (Signed)
Noted, thanks for the assistance. 

## 2020-12-16 NOTE — Telephone Encounter (Signed)
Called pt. He has been scheduled for 4/12, am appt. Aware will mail prep instructions w/ pre-op/covid test appt. Confirmed mailing address. Confirmed pharmacy. Rx sent.   PA approved via [facility]. Auth# [id number]  DOS Jan 19 2021 - Feb 18 2021

## 2020-12-16 NOTE — Progress Notes (Signed)
Cc'ed to pcp °

## 2020-12-16 NOTE — Addendum Note (Signed)
Addended by: Cheron Every on: 12/16/2020 01:38 PM   Modules accepted: Orders

## 2020-12-23 ENCOUNTER — Telehealth: Payer: Self-pay

## 2020-12-23 DIAGNOSIS — I739 Peripheral vascular disease, unspecified: Secondary | ICD-10-CM

## 2020-12-23 MED ORDER — CLOPIDOGREL BISULFATE 75 MG PO TABS: 75.0000 mg | ORAL_TABLET | Freq: Every day | ORAL | 3 refills | Status: DC

## 2020-12-23 NOTE — Telephone Encounter (Signed)
Refill sent.

## 2020-12-23 NOTE — Telephone Encounter (Signed)
  Prescription Request  12/23/2020  What is the name of the medication or equipment? clopidogrel (PLAVIX) 75 MG tablet    Have you contacted your pharmacy to request a refill? (if applicable) yes  Which pharmacy would you like this sent to? [facility]    Patient notified that their request is being sent to the clinical staff for review and that they should receive a response within 2 business days.

## 2020-12-27 ENCOUNTER — Encounter: Payer: Self-pay | Admitting: Family Medicine

## 2020-12-27 DIAGNOSIS — E785 Hyperlipidemia, unspecified: Secondary | ICD-10-CM

## 2020-12-28 MED ORDER — FENOFIBRATE 145 MG PO TABS: 145.0000 mg | ORAL_TABLET | Freq: Every day | ORAL | 3 refills | Status: DC

## 2021-01-05 ENCOUNTER — Telehealth: Payer: Self-pay | Admitting: Internal Medicine

## 2021-01-05 NOTE — Telephone Encounter (Signed)
Noted  

## 2021-01-05 NOTE — Telephone Encounter (Signed)
Called pt. Confirmed he wants to cancel procedure. He states he has a lot of things going on. Will call back to r/s. FYI to [employer]. Message sent to endo

## 2021-01-05 NOTE — Telephone Encounter (Signed)
Pt wants to cancel his procedure with Dr Abbey Chatters on 01/19/2021

## 2021-01-07 ENCOUNTER — Other Ambulatory Visit: Payer: Self-pay | Admitting: Family Medicine

## 2021-01-07 DIAGNOSIS — E785 Hyperlipidemia, unspecified: Secondary | ICD-10-CM

## 2021-01-15 ENCOUNTER — Encounter (HOSPITAL_COMMUNITY): Payer: Medicare HMO

## 2021-01-15 ENCOUNTER — Other Ambulatory Visit (HOSPITAL_COMMUNITY): Payer: Medicare HMO

## 2021-01-19 ENCOUNTER — Ambulatory Visit (HOSPITAL_COMMUNITY): Admit: 2021-01-19 | Payer: Medicare HMO

## 2021-01-19 ENCOUNTER — Encounter (HOSPITAL_COMMUNITY): Payer: Self-pay

## 2021-01-19 SURGERY — COLONOSCOPY WITH PROPOFOL
Anesthesia: Monitor Anesthesia Care

## 2021-02-10 ENCOUNTER — Other Ambulatory Visit: Payer: Self-pay

## 2021-02-10 ENCOUNTER — Encounter: Payer: Self-pay | Admitting: Nurse Practitioner

## 2021-02-10 ENCOUNTER — Other Ambulatory Visit: Payer: Self-pay | Admitting: Family Medicine

## 2021-02-10 ENCOUNTER — Ambulatory Visit (INDEPENDENT_AMBULATORY_CARE_PROVIDER_SITE_OTHER): Payer: Medicare HMO | Admitting: Nurse Practitioner

## 2021-02-10 VITALS — BP 141/85 | HR 74 | Temp 97.1°F | Ht 71.0 in | Wt 194.0 lb

## 2021-02-10 DIAGNOSIS — J0110 Acute frontal sinusitis, unspecified: Secondary | ICD-10-CM | POA: Diagnosis not present

## 2021-02-10 DIAGNOSIS — E785 Hyperlipidemia, unspecified: Secondary | ICD-10-CM

## 2021-02-10 MED ORDER — SALINE SPRAY 0.65 % NA SOLN: 1.0000 | NASAL | 1 refills | Status: DC | PRN

## 2021-02-10 MED ORDER — DOXYCYCLINE HYCLATE 100 MG PO TABS
100.0000 mg | ORAL_TABLET | Freq: Two times a day (BID) | ORAL | 0 refills | Status: DC
Start: 2021-02-10 — End: 2021-04-28

## 2021-02-10 MED ORDER — ACETAMINOPHEN 500 MG PO TABS: 500.0000 mg | ORAL_TABLET | Freq: Four times a day (QID) | ORAL | 0 refills | Status: DC | PRN

## 2021-02-10 NOTE — Progress Notes (Signed)
Acute Office Visit  Subjective:    Patient ID: Tommy [name], male    DOB: August 28, 1951, 70 y.o.   MRN: [id number]  Chief Complaint  Patient presents with  . Sinusitis    Sinusitis This is a new problem. The current episode started in the past 7 days. The problem has been gradually worsening since onset. There has been no fever. The pain is moderate. Associated symptoms include congestion, headaches, sinus pressure, sneezing and a sore throat. Pertinent negatives include no coughing or ear pain. Past treatments include nothing.     Past Medical History:  Diagnosis Date  . BPH with obstruction/lower urinary tract symptoms    hyperplasia  . ED (erectile dysfunction)    arterial insuff.  . History of DVT of lower extremity   . History of gastric ulcer   . Hyperlipidemia   . Hypertension   . Peripheral vascular disease ([place])   . Prostate cancer [facility]) DX 01/01/15--  urologist-  dr Jeffie Pollock   Stage T1c, Gleason 3+4, PSA 5.73, vol 80ml  . Wears glasses     Past Surgical History:  Procedure Laterality Date  . FEMORAL ARTERY STENT Left 07-22-2008  &  11-16-2010   left SFA//   In-Stent restenosis  . ORIF LEFT INDEX FINGER FX  09-28-2006  . PROSTATE BIOPSY  01/01/15  . PROSTATE SURGERY  July 2016   Seed Implant  . RADIOACTIVE SEED IMPLANT N/A 04/23/2015   Procedure: RADIOACTIVE SEED IMPLANT/BRACHYTHERAPY IMPLANT;  Surgeon: Irine Seal, MD;  Location: [facility];  Service: Urology;  Laterality: N/A;  . TONSILLECTOMY  as child    Family History  Problem Relation Age of Onset  . Heart disease Mother   . Cancer Mother   . Deep vein thrombosis Mother   . Diabetes Mother   . Hyperlipidemia Mother   . Hypertension Mother   . Heart attack Mother   . Peripheral vascular disease Mother   . Cancer Father   . Colon cancer Neg Hx     Social History   Socioeconomic History  . Marital status: Married    Spouse name: Not on file  . Number of children: Not on file  . Years  of education: Not on file  . Highest education level: Not on file  Occupational History  . Not on file  Tobacco Use  . Smoking status: Former Smoker    Packs/day: 1.00    Years: 15.00    Pack years: 15.00    Types: Cigarettes    Start date: 09/09/2010    Quit date: 04/21/2011    Years since quitting: 9.8  . Smokeless tobacco: Current User    Types: Snuff  . Tobacco comment: occasional  someday dips tobacco  Vaping Use  . Vaping Use: Never used  Substance and Sexual Activity  . Alcohol use: No    Alcohol/week: 0.0 standard drinks  . Drug use: No  . Sexual activity: Not on file  Other Topics Concern  . Not on file  Social History Narrative  . Not on file   Social Determinants of Health   Financial Resource Strain: Not on file  Food Insecurity: Not on file  Transportation Needs: Not on file  Physical Activity: Not on file  Stress: Not on file  Social Connections: Not on file  Intimate Partner Violence: Not on file    Outpatient Medications Prior to Visit  Medication Sig Dispense Refill  . amLODipine (NORVASC) 10 MG tablet TAKE 1 TABLET EVERY  DAY 90 tablet 1  . clopidogrel (PLAVIX) 75 MG tablet Take 1 tablet (75 mg total) by mouth daily. 90 tablet 3  . fenofibrate (TRICOR) 145 MG tablet Take 1 tablet (145 mg total) by mouth daily. 90 tablet 3  . hydrochlorothiazide (HYDRODIURIL) 25 MG tablet TAKE 1 TABLET EVERY DAY 90 tablet 1  . polyethylene glycol-electrolytes (NULYTELY) 420 g solution As directed 4000 mL 0  . rosuvastatin (CRESTOR) 10 MG tablet TAKE 1 TABLET (10 MG TOTAL) BY MOUTH EVERY EVENING. 90 tablet 0  . tamsulosin (FLOMAX) 0.4 MG CAPS capsule TAKE 1 CAPSULE BY MOUTH DAILY AFTER SUPPER. 90 capsule 1   No facility-administered medications prior to visit.    Allergies  Allergen Reactions  . Penicillins Swelling    Has patient had a PCN reaction causing immediate rash, facial/tongue/throat swelling, SOB or lightheadedness with hypotension: No Has patient had a  PCN reaction causing severe rash involving mucus membranes or skin necrosis: No Has patient had a PCN reaction that required hospitalization No Has patient had a PCN reaction occurring within the last 10 years: No If all of the above answers are "NO", then may proceed with Cephalosporin use.    Review of Systems  HENT: Positive for congestion, sinus pressure, sneezing and sore throat. Negative for ear pain.   Respiratory: Negative for cough.   Neurological: Positive for headaches.  All other systems reviewed and are negative.      Objective:    Physical Exam Vitals and nursing note reviewed.  Constitutional:      Appearance: Normal appearance.  HENT:     Head: Normocephalic.     Right Ear: External ear normal.     Left Ear: External ear normal.     Nose: Congestion present.  Eyes:     Conjunctiva/sclera: Conjunctivae normal.  Cardiovascular:     Rate and Rhythm: Normal rate and regular rhythm.     Pulses: Normal pulses.     Heart sounds: Normal heart sounds.  Pulmonary:     Effort: Pulmonary effort is normal.     Breath sounds: Normal breath sounds.  Abdominal:     General: Bowel sounds are normal.  Lymphadenopathy:     Cervical: Cervical adenopathy present.  Skin:    General: Skin is warm.     Findings: No rash.  Neurological:     Mental Status: He is alert and oriented to person, place, and time.  Psychiatric:        Behavior: Behavior normal.     BP (!) 141/85   Pulse 74   Temp (!) 97.1 F (36.2 C) (Temporal)   Ht 5\' 11"  (1.803 m)   Wt 194 lb (88 kg)   SpO2 96%   BMI 27.06 kg/m  Wt Readings from Last 3 Encounters:  02/10/21 194 lb (88 kg)  12/16/20 197 lb 12.8 oz (89.7 kg)  10/29/20 198 lb (89.8 kg)    Health Maintenance Due  Topic Date Due  . COLONOSCOPY (Pts 45-71yrs Insurance coverage will need to be confirmed)  07/11/2015    There are no preventive care reminders to display for this patient.   Lab Results  Component Value Date   TSH  0.961 07/03/2019   Lab Results  Component Value Date   WBC 3.6 10/30/2020   HGB 14.4 10/30/2020   HCT 41.2 10/30/2020   MCV 84 10/30/2020   PLT 215 10/30/2020   Lab Results  Component Value Date   NA 143 10/30/2020   K 5.4 (  H) 10/30/2020   CO2 25 10/30/2020   GLUCOSE 111 (H) 10/30/2020   BUN 19 10/30/2020   CREATININE 1.17 10/30/2020   BILITOT 0.7 10/30/2020   ALKPHOS 33 (L) 10/30/2020   AST 28 10/30/2020   ALT 35 10/30/2020   PROT 6.7 10/30/2020   ALBUMIN 4.4 10/30/2020   CALCIUM 11.1 (H) 10/30/2020   ANIONGAP 5 04/16/2015   Lab Results  Component Value Date   CHOL 117 10/30/2020   Lab Results  Component Value Date   HDL 36 (L) 10/30/2020   Lab Results  Component Value Date   LDLCALC 64 10/30/2020   Lab Results  Component Value Date   TRIG 89 10/30/2020   Lab Results  Component Value Date   CHOLHDL 3.3 10/30/2020   Lab Results  Component Value Date   HGBA1C 6.0 10/30/2020       Assessment & Plan:   Problem List Items Addressed This Visit      Respiratory   Subacute frontal sinusitis - Primary    Symptoms not well controlled in the last 7 days.  Patient is reporting symptoms of headache, sneezing, head pressure and congestion.  He denies fever, nausea and vomiting. Encourage patient to take medication as prescribed Increase hydration Rx doxycycline 100 mg tablet by mouth twice daily sent to pharmacy. Tylenol for headache/fever Ocean Spray Coolmist humidifier  Education provided to patient with printed handouts given Follow-up with worsening unresolved symptoms.      Relevant Medications   doxycycline (VIBRA-TABS) 100 MG tablet   acetaminophen (TYLENOL) 500 MG tablet   sodium chloride (OCEAN) 0.65 % SOLN nasal spray       Meds ordered this encounter  Medications  . doxycycline (VIBRA-TABS) 100 MG tablet    Sig: Take 1 tablet (100 mg total) by mouth 2 (two) times daily.    Dispense:  14 tablet    Refill:  0    Order Specific Question:    Supervising Provider    Answer:   Janora Norlander [[id number]]  . acetaminophen (TYLENOL) 500 MG tablet    Sig: Take 1 tablet (500 mg total) by mouth every 6 (six) hours as needed.    Dispense:  30 tablet    Refill:  0    Order Specific Question:   Supervising Provider    Answer:   Janora Norlander [[id number]]  . sodium chloride (OCEAN) 0.65 % SOLN nasal spray    Sig: Place 1 spray into both nostrils as needed for congestion.    Dispense:  60 mL    Refill:  1    Order Specific Question:   Supervising Provider    Answer:   Janora Norlander [[id number]]     Ivy Lynn, NP

## 2021-02-10 NOTE — Patient Instructions (Signed)

## 2021-02-10 NOTE — Assessment & Plan Note (Signed)
Symptoms not well controlled in the last 7 days.  Patient is reporting symptoms of headache, sneezing, head pressure and congestion.  He denies fever, nausea and vomiting. Encourage patient to take medication as prescribed Increase hydration Rx doxycycline 100 mg tablet by mouth twice daily sent to pharmacy. Tylenol for headache/fever Ocean Spray Coolmist humidifier  Education provided to patient with printed handouts given Follow-up with worsening unresolved symptoms.

## 2021-04-25 ENCOUNTER — Other Ambulatory Visit: Payer: Self-pay | Admitting: Family Medicine

## 2021-04-25 DIAGNOSIS — I10 Essential (primary) hypertension: Secondary | ICD-10-CM

## 2021-04-25 DIAGNOSIS — N400 Enlarged prostate without lower urinary tract symptoms: Secondary | ICD-10-CM

## 2021-04-28 ENCOUNTER — Encounter: Payer: Self-pay | Admitting: Family Medicine

## 2021-04-28 ENCOUNTER — Ambulatory Visit (INDEPENDENT_AMBULATORY_CARE_PROVIDER_SITE_OTHER): Payer: Medicare HMO | Admitting: Family Medicine

## 2021-04-28 ENCOUNTER — Other Ambulatory Visit: Payer: Self-pay

## 2021-04-28 VITALS — BP 138/79 | HR 66 | Ht 71.0 in | Wt 192.0 lb

## 2021-04-28 DIAGNOSIS — N400 Enlarged prostate without lower urinary tract symptoms: Secondary | ICD-10-CM

## 2021-04-28 DIAGNOSIS — E785 Hyperlipidemia, unspecified: Secondary | ICD-10-CM

## 2021-04-28 DIAGNOSIS — I10 Essential (primary) hypertension: Secondary | ICD-10-CM | POA: Diagnosis not present

## 2021-04-28 DIAGNOSIS — R7303 Prediabetes: Secondary | ICD-10-CM | POA: Diagnosis not present

## 2021-04-28 LAB — BAYER DCA HB A1C WAIVED: HB A1C (BAYER DCA - WAIVED): 5.9 % (ref <7.0)

## 2021-04-28 MED ORDER — AMLODIPINE BESYLATE 10 MG PO TABS: 10.0000 mg | ORAL_TABLET | Freq: Every day | ORAL | 3 refills | Status: DC

## 2021-04-28 MED ORDER — HYDROCHLOROTHIAZIDE 25 MG PO TABS: 25.0000 mg | ORAL_TABLET | Freq: Every day | ORAL | 3 refills | Status: DC

## 2021-04-28 MED ORDER — TAMSULOSIN HCL 0.4 MG PO CAPS: ORAL_CAPSULE | ORAL | 3 refills | Status: DC

## 2021-04-28 MED ORDER — ROSUVASTATIN CALCIUM 10 MG PO TABS: 10.0000 mg | ORAL_TABLET | Freq: Every evening | ORAL | 3 refills | Status: DC

## 2021-04-28 NOTE — Progress Notes (Signed)
BP 138/79   Pulse 66   Ht _0  (1.803 m)   Wt 192 lb (87.1 kg)   SpO2 96%   BMI 26.78 kg/m    Subjective:   Patient ID: Tommy [name], male    DOB: January 09, 1951, 70 y.o.   MRN: [id number]  HPI: Tommy [name] is a 70 y.o. male presenting on 04/28/2021 for Medical Management of Chronic Issues, Hypertension, and Back Pain (Left lower back. ? Pulled muscle)   HPI Prediabetes Patient comes in today for recheck of his diabetes. Patient has been currently taking no medication currently, will check A1c today. Patient is not currently on an ACE inhibitor/ARB. Patient has not seen an ophthalmologist this year. Patient denies any issues with their feet. The symptom started onset as an adult hypertension and hyperlipidemia ARE RELATED TO DM   Hypertension Patient is currently on amlodipine and hydrochlorothiazide, and their blood pressure today is 138/79. Patient denies any lightheadedness or dizziness. Patient denies headaches, blurred vision, chest pains, shortness of breath, or weakness. Denies any side effects from medication and is content with current medication.   Hyperlipidemia Patient is coming in for recheck of his hyperlipidemia. The patient is currently taking Crestor fenofibrate. They deny any issues with myalgias or history of liver damage from it. They deny any focal numbness or weakness or chest pain.   Relevant past medical, surgical, family and social history reviewed and updated as indicated. Interim medical history since our last visit reviewed. Allergies and medications reviewed and updated.  Review of Systems  Constitutional:  Negative for chills and fever.  Eyes:  Negative for visual disturbance.  Respiratory:  Negative for shortness of breath and wheezing.   Cardiovascular:  Negative for chest pain and leg swelling.  Musculoskeletal:  Negative for back pain and gait problem.  Skin:  Negative for rash.  Neurological:  Negative for dizziness and light-headedness.  All  other systems reviewed and are negative.  Per HPI unless specifically indicated above   Allergies as of 04/28/2021       Reactions   Penicillins Swelling   Has patient had a PCN reaction causing immediate rash, facial/tongue/throat swelling, SOB or lightheadedness with hypotension: No Has patient had a PCN reaction causing severe rash involving mucus membranes or skin necrosis: No Has patient had a PCN reaction that required hospitalization No Has patient had a PCN reaction occurring within the last 10 years: No If all of the above answers are "NO", then may proceed with Cephalosporin use.        Medication List        Accurate as of April 28, 2021  9:12 AM. If you have any questions, ask your nurse or doctor.          STOP taking these medications    doxycycline 100 MG tablet Commonly known as: VIBRA-TABS Stopped by: Fransisca Kaufmann Haleem Hanner, MD   polyethylene glycol-electrolytes 420 g solution Commonly known as: NuLYTELY Stopped by: Worthy Rancher, MD       TAKE these medications    acetaminophen 500 MG tablet Commonly known as: TYLENOL Take 1 tablet (500 mg total) by mouth every 6 (six) hours as needed.   amLODipine 10 MG tablet Commonly known as: NORVASC Take 1 tablet (10 mg total) by mouth daily.   clopidogrel 75 MG tablet Commonly known as: PLAVIX Take 1 tablet (75 mg total) by mouth daily.   fenofibrate 145 MG tablet Commonly known as: TRICOR Take 1 tablet (145 mg  total) by mouth daily.   hydrochlorothiazide 25 MG tablet Commonly known as: HYDRODIURIL Take 1 tablet (25 mg total) by mouth daily.   rosuvastatin 10 MG tablet Commonly known as: CRESTOR Take 1 tablet (10 mg total) by mouth every evening.   sodium chloride 0.65 % Soln nasal spray Commonly known as: OCEAN Place 1 spray into both nostrils as needed for congestion.   tamsulosin 0.4 MG Caps capsule Commonly known as: FLOMAX Take one capsule daily after supper What changed: See the  new instructions. Changed by: Fransisca Kaufmann Glenetta Kiger, MD         Objective:   BP 138/79   Pulse 66   Ht _0  (1.803 m)   Wt 192 lb (87.1 kg)   SpO2 96%   BMI 26.78 kg/m   Wt Readings from Last 3 Encounters:  04/28/21 192 lb (87.1 kg)  02/10/21 194 lb (88 kg)  12/16/20 197 lb 12.8 oz (89.7 kg)    Physical Exam Vitals and nursing note reviewed.  Constitutional:      General: He is not in acute distress.    Appearance: He is well-developed. He is not diaphoretic.  Eyes:     General: No scleral icterus.    Conjunctiva/sclera: Conjunctivae normal.  Neck:     Thyroid: No thyromegaly.  Cardiovascular:     Rate and Rhythm: Normal rate and regular rhythm.     Heart sounds: Normal heart sounds. No murmur heard. Pulmonary:     Effort: Pulmonary effort is normal. No respiratory distress.     Breath sounds: Normal breath sounds. No wheezing.  Musculoskeletal:        General: Normal range of motion.     Cervical back: Neck supple.  Lymphadenopathy:     Cervical: No cervical adenopathy.  Skin:    General: Skin is warm and dry.     Findings: No rash.  Neurological:     Mental Status: He is alert and oriented to person, place, and time.     Coordination: Coordination normal.  Psychiatric:        Behavior: Behavior normal.    Results for orders placed or performed in visit on 10/30/20  Bayer DCA Hb A1c Waived  Result Value Ref Range   HB A1C (BAYER DCA - WAIVED) 6.0 <7.0 %  Lipid panel  Result Value Ref Range   Cholesterol, Total 117 100 - 199 mg/dL   Triglycerides 89 0 - 149 mg/dL   HDL 36 (L) >39 mg/dL   VLDL Cholesterol Cal 17 5 - 40 mg/dL   LDL Chol Calc (NIH) 64 0 - 99 mg/dL   Chol/HDL Ratio 3.3 0.0 - 5.0 ratio  CMP14+EGFR  Result Value Ref Range   Glucose 111 (H) 65 - 99 mg/dL   BUN 19 8 - 27 mg/dL   Creatinine, Ser 1.17 0.76 - 1.27 mg/dL   GFR calc non Af Amer 62 >59 mL/min/1.73   GFR calc Af Amer 72 >59 mL/min/1.73   BUN/Creatinine Ratio 16 10 - 24    Sodium 143 134 - 144 mmol/L   Potassium 5.4 (H) 3.5 - 5.2 mmol/L   Chloride 104 96 - 106 mmol/L   CO2 25 20 - 29 mmol/L   Calcium 11.1 (H) 8.6 - 10.2 mg/dL   Total Protein 6.7 6.0 - 8.5 g/dL   Albumin 4.4 3.7 - 4.7 g/dL   Globulin, Total 2.3 1.5 - 4.5 g/dL   Albumin/Globulin Ratio 1.9 1.2 - 2.2   Bilirubin Total 0.7  0.0 - 1.2 mg/dL   Alkaline Phosphatase 33 (L) 44 - 121 IU/L   AST 28 0 - 40 IU/L   ALT 35 0 - 44 IU/L  CBC with Differential/Platelet  Result Value Ref Range   WBC 3.6 3.4 - 10.8 x10E3/uL   RBC 4.89 4.14 - 5.80 x10E6/uL   Hemoglobin 14.4 13.0 - 17.7 g/dL   Hematocrit 41.2 37.5 - 51.0 %   MCV 84 79 - 97 fL   MCH 29.4 26.6 - 33.0 pg   MCHC 35.0 31.5 - 35.7 g/dL   RDW 11.9 11.6 - 15.4 %   Platelets 215 150 - 450 x10E3/uL   Neutrophils 49 Not Estab. %   Lymphs 37 Not Estab. %   Monocytes 8 Not Estab. %   Eos 4 Not Estab. %   Basos 1 Not Estab. %   Neutrophils Absolute 1.8 1.4 - 7.0 x10E3/uL   Lymphocytes Absolute 1.4 0.7 - 3.1 x10E3/uL   Monocytes Absolute 0.3 0.1 - 0.9 x10E3/uL   EOS (ABSOLUTE) 0.1 0.0 - 0.4 x10E3/uL   Basophils Absolute 0.0 0.0 - 0.2 x10E3/uL   Immature Granulocytes 1 Not Estab. %   Immature Grans (Abs) 0.0 0.0 - 0.1 x10E3/uL    Assessment & Plan:   Problem List Items Addressed This Visit       Cardiovascular and Mediastinum   HTN (hypertension) - Primary   Relevant Medications   amLODipine (NORVASC) 10 MG tablet   hydrochlorothiazide (HYDRODIURIL) 25 MG tablet   rosuvastatin (CRESTOR) 10 MG tablet     Other   Hyperlipidemia LDL goal <130   Relevant Medications   amLODipine (NORVASC) 10 MG tablet   hydrochlorothiazide (HYDRODIURIL) 25 MG tablet   rosuvastatin (CRESTOR) 10 MG tablet   Other Relevant Orders   Lipid panel   Prediabetes   Relevant Orders   Bayer DCA Hb A1c Waived   Other Visit Diagnoses     Essential hypertension       Relevant Medications   amLODipine (NORVASC) 10 MG tablet   hydrochlorothiazide (HYDRODIURIL)  25 MG tablet   rosuvastatin (CRESTOR) 10 MG tablet   Other Relevant Orders   CBC with Differential/Platelet   CMP14+EGFR   Benign prostatic hyperplasia without lower urinary tract symptoms       Relevant Medications   tamsulosin (FLOMAX) 0.4 MG CAPS capsule   Other Relevant Orders   PSA, total and free     Continue current medication, seems to be doing well, he complained that he had a muscle strain in his back but is gone now so we will just keep an eye on it.  Likely muscle spasm.  Follow up plan: Return in about 6 months (around 10/29/2021), or if symptoms worsen or fail to improve, for Hypertension and hyperlipidemia and BPH.  Counseling provided for all of the vaccine components Orders Placed This Encounter  Procedures   CBC with Differential/Platelet   CMP14+EGFR   Lipid panel   Bayer DCA Hb A1c Waived   PSA, total and free    Caryl Pina, MD [facility] 04/28/2021, 9:12 AM

## 2021-04-29 LAB — PSA, TOTAL AND FREE
PSA, Free: <0.01 ng/mL
Prostate Specific Ag, Serum: <0.1 ng/mL (ref 0.0–4.0)

## 2021-04-29 LAB — CMP14+EGFR
ALT: 30 IU/L (ref 0–44)
AST: 23 IU/L (ref 0–40)
Albumin/Globulin Ratio: 1.8 (ref 1.2–2.2)
Albumin: 4.6 g/dL (ref 3.7–4.7)
Alkaline Phosphatase: 42 IU/L — ABNORMAL LOW (ref 44–121)
BUN/Creatinine Ratio: 13 (ref 10–24)
BUN: 16 mg/dL (ref 8–27)
Bilirubin Total: 0.7 mg/dL (ref 0.0–1.2)
CO2: 27 mmol/L (ref 20–29)
Calcium: 11.6 mg/dL — ABNORMAL HIGH (ref 8.6–10.2)
Chloride: 104 mmol/L (ref 96–106)
Creatinine, Ser: 1.22 mg/dL (ref 0.76–1.27)
Globulin, Total: 2.5 g/dL (ref 1.5–4.5)
Glucose: 111 mg/dL — ABNORMAL HIGH (ref 65–99)
Potassium: 5.5 mmol/L — ABNORMAL HIGH (ref 3.5–5.2)
Sodium: 146 mmol/L — ABNORMAL HIGH (ref 134–144)
Total Protein: 7.1 g/dL (ref 6.0–8.5)
eGFR: 63 mL/min/{1.73_m2} (ref >59)

## 2021-04-29 LAB — CBC WITH DIFFERENTIAL/PLATELET
Basophils Absolute: 0 10*3/uL (ref 0.0–0.2)
Basos: 1 %
EOS (ABSOLUTE): 0.1 10*3/uL (ref 0.0–0.4)
Eos: 3 %
Hematocrit: 45.1 % (ref 37.5–51.0)
Hemoglobin: 15.3 g/dL (ref 13.0–17.7)
Immature Grans (Abs): 0 10*3/uL (ref 0.0–0.1)
Immature Granulocytes: 1 %
Lymphocytes Absolute: 1.4 10*3/uL (ref 0.7–3.1)
Lymphs: 32 %
MCH: 29.6 pg (ref 26.6–33.0)
MCHC: 33.9 g/dL (ref 31.5–35.7)
MCV: 87 fL (ref 79–97)
Monocytes Absolute: 0.4 10*3/uL (ref 0.1–0.9)
Monocytes: 10 %
Neutrophils Absolute: 2.5 10*3/uL (ref 1.4–7.0)
Neutrophils: 53 %
Platelets: 214 10*3/uL (ref 150–450)
RBC: 5.17 x10E6/uL (ref 4.14–5.80)
RDW: 12.3 % (ref 11.6–15.4)
WBC: 4.5 10*3/uL (ref 3.4–10.8)

## 2021-04-29 LAB — LIPID PANEL
Chol/HDL Ratio: 3 ratio (ref 0.0–5.0)
Cholesterol, Total: 124 mg/dL (ref 100–199)
HDL: 42 mg/dL (ref >39)
LDL Chol Calc (NIH): 68 mg/dL (ref 0–99)
Triglycerides: 69 mg/dL (ref 0–149)
VLDL Cholesterol Cal: 14 mg/dL (ref 5–40)

## 2021-04-30 ENCOUNTER — Ambulatory Visit (INDEPENDENT_AMBULATORY_CARE_PROVIDER_SITE_OTHER): Payer: Medicare HMO

## 2021-04-30 VITALS — Ht 71.0 in | Wt 189.0 lb

## 2021-04-30 DIAGNOSIS — Z0000 Encounter for general adult medical examination without abnormal findings: Secondary | ICD-10-CM | POA: Diagnosis not present

## 2021-04-30 NOTE — Progress Notes (Signed)
Subjective:   Tommy [name] is a 70 y.o. male who presents for an Initial Medicare Annual Wellness Visit.  Virtual Visit via Telephone Note  I connected with  Tommy [name] on 04/30/21 at 10:30 AM EDT by telephone and verified that I am speaking with the correct person using two identifiers.  Location: Patient: Home Provider: [facility] Persons participating in the virtual visit: patient/Nurse Health Advisor   I discussed the limitations, risks, security and privacy concerns of performing an evaluation and management service by telephone and the availability of in person appointments. The patient expressed understanding and agreed to proceed.  Interactive audio and video telecommunications were attempted between this nurse and patient, however failed, due to patient having technical difficulties OR patient did not have access to video capability.  We continued and completed visit with audio only.  Some vital signs may be absent or patient reported.   Tommy [name] E Patriciaann Rabanal, LPN   Review of Systems     Cardiac Risk Factors include: advanced age (>65mn, >>81women);hypertension;male gender;Other (see comment), Risk factor comments: atherosclerosis, CAD     Objective:    Today's Vitals   04/30/21 1004  Weight: 189 lb (85.7 kg)  Height: '5\' 11"'$  (1.803 m)   Body mass index is 26.36 kg/m.  Advanced Directives 04/30/2021 08/04/2016 08/24/2015 04/23/2015 02/02/2015 08/15/2014  Does Patient Have a Medical Advance Directive? Yes No Yes Yes Yes No  Type of AParamed[place]of AF[place] will - [place] will [place] will [place] will -  Does patient want to make changes to medical advance directive? - - - No - Patient declined No - Patient declined -  Copy of [place]? No - copy requested - Yes No - copy requested No - copy requested -  Would patient like information on  creating a medical advance directive? - No - patient declined information - - - Yes - Educational materials given    Current Medications (verified) Outpatient Encounter Medications as of 04/30/2021  Medication Sig   acetaminophen (TYLENOL) 500 MG tablet Take 1 tablet (500 mg total) by mouth every 6 (six) hours as needed.   amLODipine (NORVASC) 10 MG tablet Take 1 tablet (10 mg total) by mouth daily.   clopidogrel (PLAVIX) 75 MG tablet Take 1 tablet (75 mg total) by mouth daily.   fenofibrate (TRICOR) 145 MG tablet Take 1 tablet (145 mg total) by mouth daily.   hydrochlorothiazide (HYDRODIURIL) 25 MG tablet Take 1 tablet (25 mg total) by mouth daily.   rosuvastatin (CRESTOR) 10 MG tablet Take 1 tablet (10 mg total) by mouth every evening.   sodium chloride (OCEAN) 0.65 % SOLN nasal spray Place 1 spray into both nostrils as needed for congestion.   tamsulosin (FLOMAX) 0.4 MG CAPS capsule Take one capsule daily after supper   No facility-administered encounter medications on file as of 04/30/2021.    Allergies (verified) Penicillins   History: Past Medical History:  Diagnosis Date   BPH with obstruction/lower urinary tract symptoms    hyperplasia   ED (erectile dysfunction)    arterial insuff.   History of DVT of lower extremity    History of gastric ulcer    Hyperlipidemia    Hypertension    [place]ipheral vascular disease ([place]    [place]--  urologist-  dr wJeffie Pollock  Stage T1c, Gleason 3+4, PSA 5.73, vol 343m  Wears glasses  Past Surgical History:  Procedure Laterality Date   FEMORAL ARTERY STENT Left 07-22-2008  &  11-16-2010   left SFA//   In-Stent restenosis   ORIF LEFT INDEX FINGER FX  09-28-2006   PROSTATE BIOPSY  01/01/15   PROSTATE SURGERY  July 2016   Seed Implant   RADIOACTIVE SEED IMPLANT N/A 04/23/2015   Procedure: RADIOACTIVE SEED IMPLANT/BRACHYTHERAPY IMPLANT;  Surgeon: Irine Seal, MD;  Location[facility];  Service: Urology;   Laterality: N/A;   TONSILLECTOMY  as child   Family History  Problem Relation Age of Onset   Heart disease Mother    Cancer Mother    Deep vein thrombosis Mother    Diabetes Mother    Hyperlipidemia Mother    Hypertension Mother    Heart attack Mother    Peripheral vascular disease Mother    Cancer Father    Colon cancer Neg Hx    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  Tobacco Use   Smoking status: Former    Packs/day: 1.00    Years: 15.00    Pack years: 15.00    Types: Cigarettes    Start date: 09/09/2010    Quit date: 04/21/2011    Years since quitting: 10.0   Smokeless tobacco: Current    Types: Snuff   Tobacco comments:    occasional  someday dips tobacco  Vaping Use   Vaping Use: Never used  Substance and Sexual Activity   Alcohol use: No    Alcohol/week: 0.0 standard drinks   Drug use: No   Sexual activity: Not on file  Other Topics Concern   Not on file  Social History Narrative   2 daughters and one son - all live nearby   Social Determinants of Health   Financial Resource Strain: Low Risk    Difficulty of Paying Living Expenses: Not hard at all  Food Insecurity: No Food Insecurity   Worried About Charity fundraiser in the Last Year: Never true   Arboriculturist in the Last Year: Never true  Transportation Needs: No Transportation Needs   Lack of Transportation (Medical): No   Lack of Transportation (Non-Medical): No  Physical Activity: Sufficiently Active   Days of Exercise per Week: 7 days   Minutes of Exercise per Session: 40 min  Stress: No Stress Concern Present   Feeling of Stress : Not at all  Social Connections: Socially Integrated   Frequency of Communication with Friends and Family: More than three  times a week   Frequency of Social Gatherings with Friends and Family: More than three times a week   Attends Religious Services: 1 to 4 times per year   Active Member of [employer] or Organizations: Yes   Attends Archivist Meetings: 1 to 4 times per year   Marital Status: Married    Tobacco Counseling Ready to quit: Not Answered Counseling given: Not Answered Tobacco comments: occasional  someday dips tobacco   Clinical Intake:  Pre-visit preparation completed: Yes  Pain : No/denies pain     BMI - recorded: 26.36 Nutritional Status: BMI 25 -29 Overweight Nutritional Risks: None Diabetes: No  How often do you need to have someone  help you when you read instructions, pamphlets, or other written materials from your doctor or pharmacy?: 1 - Never  Diabetic? No - pre-diabetic  Interpreter Needed?: No  Information entered by :: Brinly Maietta, LPN   Activities of Daily Living In your present state of health, do you have any difficulty performing the following activities: 04/30/2021  Hearing? Y  Comment mild hearing difficulties  Vision? N  Difficulty concentrating or making decisions? N  Walking or climbing stairs? N  Dressing or bathing? N  Doing errands, shopping? N  Preparing Food and eating ? N  Using the Toilet? N  In the past six months, have you accidently leaked urine? N  Do you have problems with loss of bowel control? N  Managing your Medications? N  Managing your Finances? N  Housekeeping or managing your Housekeeping? N  Some recent data might be hidden    Patient Care Team: [name], Fransisca Kaufmann, MD as PCP - General (Family Medicine) Eloise Harman, DO as Consulting Physician (Internal Medicine) Irine Seal, MD as Attending Physician (Urology)  Indicate any recent Medical Services you may have received from other than [facility] providers in the past year (date may be approximate).     Assessment:   This is a routine wellness examination for  [place].  Hearing/Vision screen Hearing Screening - Comments:: C/o  mild hearing difficulties - declines hearing aids Vision Screening - Comments:: Wears eyeglasses - up to date with annual eye exam with optometrist in [place] (forgot his name)  Dietary issues and exercise activities discussed: Current Exercise Habits: Home exercise routine, Type of exercise: walking, Time (Minutes): 45, Frequency (Times/Week): 7, Weekly Exercise (Minutes/Week): 315, Intensity: Mild, Exercise limited by: None identified   Goals Addressed             This Visit's Progress    Make and Keep All Appointments       Timeframe:  Short-Term Goal Priority:  High Start Date:                             Expected End Date:                       Follow Up Date 05/02/21    - call to cancel if needed - keep a calendar with appointment dates    Why is this important?   Part of staying healthy is seeing the doctor for follow-up care.  If you forget your appointments, there are some things you can do to stay on track.        Depression Screen PHQ 2/9 Scores 04/30/2021 04/28/2021 02/10/2021 10/29/2020 05/26/2020 02/21/2020 11/07/2019  PHQ - 2 Score 0 0 0 0 0 0 0  PHQ- 9 Score 2 - 0 - - - -    Fall Risk Fall Risk  04/30/2021 04/28/2021 10/29/2020 05/26/2020 02/21/2020  Falls in the past year? 0 0 0 0 0  Number falls in past yr: 0 - - - -  Injury with Fall? 0 - - - -  Comment - - - - -  Risk for fall due to : No Fall Risks - - - -  Follow up Falls prevention discussed - - - -    FALL RISK PREVENTION PERTAINING TO THE HOME:  Any stairs in or around the home? No  If so, are there any without handrails? No  Home free of loose throw rugs in walkways, pet  beds, electrical cords, etc? Yes  Adequate lighting in your home to reduce risk of falls? Yes   ASSISTIVE DEVICES UTILIZED TO PREVENT FALLS:  Life alert? No  Use of a cane, walker or w/c? No  Grab bars in the bathroom? Yes  Shower chair or bench in shower?  Yes  Elevated toilet seat or a handicapped toilet? Yes   TIMED UP AND GO:  Was the test performed? No . Telephonic visit  Cognitive Function:     6CIT Screen 04/30/2021  What Year? 0 points  What month? 0 points  What time? 0 points  Count back from 20 0 points  Months in reverse 0 points  Repeat phrase 0 points  Total Score 0    Immunizations Immunization History  Administered Date(s) Administered   Fluad Quad(high Dose 65+) 07/03/2019   Influenza Split 07/13/2013   Influenza Whole 07/12/2010   Influenza, High Dose Seasonal PF 07/05/2018   Influenza,inj,Quad PF,6+ Mos 07/23/2014, 07/20/2015, 07/07/2016, 07/26/2017   Moderna Sars-Covid-2 Vaccination 12/05/2019, 01/03/2020, 10/15/2020   Pneumococcal Conjugate-13 07/07/2016, 01/22/2018   Pneumococcal Polysaccharide-23 10/11/2001, 10/11/2001, 10/11/2001, 07/03/2019   Tdap 08/04/2016   Zoster, Live 08/08/2013    TDAP status: Up to date  Flu Vaccine status: Up to date  Pneumococcal vaccine status: Up to date  Covid-19 vaccine status: Completed vaccines  Qualifies for Shingles Vaccine? Yes   Zostavax completed Yes   Shingrix Completed?: No.    Education has been provided regarding the importance of this vaccine. Patient has been advised to call insurance company to determine out of pocket expense if they have not yet received this vaccine. Advised may also receive vaccine at local pharmacy or Health Dept. Verbalized acceptance and understanding.  Screening Tests Health Maintenance  Topic Date Due   Zoster Vaccines- Shingrix (1 of 2) Never done   COLONOSCOPY (Pts 45-2yr Insurance coverage will need to be confirmed)  07/11/2015   COVID-19 Vaccine (4 - Booster for Moderna series) 01/13/2021   INFLUENZA VACCINE  05/10/2021   TETANUS/TDAP  08/04/2026   Hepatitis C Screening  Completed   PNA vac Low Risk Adult  Completed   HPV VACCINES  Aged Out    Health Maintenance  Health Maintenance Due  Topic Date Due    Zoster Vaccines- Shingrix (1 of 2) Never done   COLONOSCOPY (Pts 45-475yrInsurance coverage will need to be confirmed)  07/11/2015   COVID-19 Vaccine (4 - Booster for Moderna series) 01/13/2021    Colorectal cancer screening: Type of screening: Colonoscopy. Completed 07/10/2010. Repeat every 5 years He is past due - will call [facility] and reschedule appt  Lung Cancer Screening: (Low Dose CT Chest recommended if Age 72-80ears, 30 pack-year currently smoking OR have quit w/in 15years.) does not qualify.   Additional Screening:  Hepatitis C Screening: does qualify; Completed 12/18/2015  Vision Screening: Recommended annual ophthalmology exams for early detection of glaucoma and other disorders of the eye. Is the patient up to date with their annual eye exam?  Yes  Who is the provider or what is the name of the office in which the patient attends annual eye exams? Someone in [place] pt is not established with a provider, would they like to be referred to a provider to establish care? No .   Dental Screening: Recommended annual dental exams for proper oral hygiene  Community Resource Referral / Chronic Care Management: CRR required this visit?  No   CCM required this visit?  No  Plan:     I have personally reviewed and noted the following in the patient's chart:   Medical and social history Use of alcohol, tobacco or illicit drugs  Current medications and supplements including opioid prescriptions. Patient is not currently taking opioid prescriptions. Functional ability and status Nutritional status Physical activity Advanced directives List of other physicians Hospitalizations, surgeries, and ER visits in previous 12 months Vitals Screenings to include cognitive, depression, and falls Referrals and appointments  In addition, I have reviewed and discussed with patient certain preventive protocols, quality metrics, and best practice recommendations. A written  personalized care plan for preventive services as well as general preventive health recommendations were provided to patient.     Sandrea Hammond, LPN   [id number]   Nurse Notes: None

## 2021-04-30 NOTE — Patient Instructions (Signed)
Tommy [name] , Thank you for taking time to come for your Medicare Wellness Visit. I appreciate your ongoing commitment to your health goals. Please review the following plan we discussed and let me know if I can assist you in the future.   Screening recommendations/referrals: Colonoscopy: Done 07/10/2010 - Repeat in 5 years (past due - reschedule with Marylee Floras asap) Recommended yearly ophthalmology/optometry visit for glaucoma screening and checkup Recommended yearly dental visit for hygiene and checkup  Vaccinations: Influenza vaccine: Done 07/30/2020 - Repeat annually  Pneumococcal vaccine: Done 01/22/2018 & 07/03/2019 Tdap vaccine: Done 08/04/2016 - Repeat in 10 years  Shingles vaccine: Zostavax done 08/08/2013; Due for Shingrix. discussed. Please contact your pharmacy for coverage information.     Covid-19: Done 12/05/19, 01/03/20, & 10/16/2019 - Due for second booster  Advanced directives: Please bring a copy of your health care power of attorney and living will to the office to be added to your chart at your convenience.   Conditions/risks identified: Aim for 30 minutes of exercise or brisk walking each day, drink 6-8 glasses of water and eat lots of fruits and vegetables.   Next appointment: Follow up in one year for your annual wellness visit.   Preventive Care 72 Years and Older, Male  Preventive care refers to lifestyle choices and visits with your health care provider that can promote health and wellness. What does preventive care include? A yearly physical exam. This is also called an annual well check. Dental exams once or twice a year. Routine eye exams. Ask your health care provider how often you should have your eyes checked. Personal lifestyle choices, including: Daily care of your teeth and gums. Regular physical activity. Eating a healthy diet. Avoiding tobacco and drug use. Limiting alcohol use. Practicing safe sex. Taking low doses of aspirin every day. Taking  vitamin and mineral supplements as recommended by your health care provider. What happens during an annual well check? The services and screenings done by your health care provider during your annual well check will depend on your age, overall health, lifestyle risk factors, and family history of disease. Counseling  Your health care provider may ask you questions about your: Alcohol use. Tobacco use. Drug use. Emotional well-being. Home and relationship well-being. Sexual activity. Eating habits. History of falls. Memory and ability to understand (cognition). Work and work Statistician. Screening  You may have the following tests or measurements: Height, weight, and BMI. Blood pressure. Lipid and cholesterol levels. These may be checked every 5 years, or more frequently if you are over 55 years old. Skin check. Lung cancer screening. You may have this screening every year starting at age 20 if you have a 30-pack-year history of smoking and currently smoke or have quit within the past 15 years. Fecal occult blood test (FOBT) of the stool. You may have this test every year starting at age 73. Flexible sigmoidoscopy or colonoscopy. You may have a sigmoidoscopy every 5 years or a colonoscopy every 10 years starting at age 3. Prostate cancer screening. Recommendations will vary depending on your family history and other risks. Hepatitis C blood test. Hepatitis B blood test. Sexually transmitted disease (STD) testing. Diabetes screening. This is done by checking your blood sugar (glucose) after you have not eaten for a while (fasting). You may have this done every 1-3 years. Abdominal aortic aneurysm (AAA) screening. You may need this if you are a current or former smoker. Osteoporosis. You may be screened starting at age 25 if you are  at high risk. Talk with your health care provider about your test results, treatment options, and if necessary, the need for more tests. Vaccines  Your  health care provider may recommend certain vaccines, such as: Influenza vaccine. This is recommended every year. Tetanus, diphtheria, and acellular pertussis (Tdap, Td) vaccine. You may need a Td booster every 10 years. Zoster vaccine. You may need this after age 9. Pneumococcal 13-valent conjugate (PCV13) vaccine. One dose is recommended after age 81. Pneumococcal polysaccharide (PPSV23) vaccine. One dose is recommended after age 11. Talk to your health care provider about which screenings and vaccines you need and how often you need them. This information is not intended to replace advice given to you by your health care provider. Make sure you discuss any questions you have with your health care provider. Document Released: 10/23/2015 Document Revised: 06/15/2016 Document Reviewed: 07/28/2015 Elsevier Interactive Patient Education  2017 [employer] Prevention in the Home Falls can cause injuries. They can happen to people of all ages. There are many things you can do to make your home safe and to help prevent falls. What can I do on the outside of my home? Regularly fix the edges of walkways and driveways and fix any cracks. Remove anything that might make you trip as you walk through a door, such as a raised step or threshold. Trim any bushes or trees on the path to your home. Use bright outdoor lighting. Clear any walking paths of anything that might make someone trip, such as rocks or tools. Regularly check to see if handrails are loose or broken. Make sure that both sides of any steps have handrails. Any raised decks and porches should have guardrails on the edges. Have any leaves, snow, or ice cleared regularly. Use sand or salt on walking paths during winter. Clean up any spills in your garage right away. This includes oil or grease spills. What can I do in the bathroom? Use night lights. Install grab bars by the toilet and in the tub and shower. Do not use towel bars as  grab bars. Use non-skid mats or decals in the tub or shower. If you need to sit down in the shower, use a plastic, non-slip stool. Keep the floor dry. Clean up any water that spills on the floor as soon as it happens. Remove soap buildup in the tub or shower regularly. Attach bath mats securely with double-sided non-slip rug tape. Do not have throw rugs and other things on the floor that can make you trip. What can I do in the bedroom? Use night lights. Make sure that you have a light by your bed that is easy to reach. Do not use any sheets or blankets that are too big for your bed. They should not hang down onto the floor. Have a firm chair that has side arms. You can use this for support while you get dressed. Do not have throw rugs and other things on the floor that can make you trip. What can I do in the kitchen? Clean up any spills right away. Avoid walking on wet floors. Keep items that you use a lot in easy-to-reach places. If you need to reach something above you, use a strong step stool that has a grab bar. Keep electrical cords out of the way. Do not use floor polish or wax that makes floors slippery. If you must use wax, use non-skid floor wax. Do not have throw rugs and other things on the floor  that can make you trip. What can I do with my stairs? Do not leave any items on the stairs. Make sure that there are handrails on both sides of the stairs and use them. Fix handrails that are broken or loose. Make sure that handrails are as long as the stairways. Check any carpeting to make sure that it is firmly attached to the stairs. Fix any carpet that is loose or worn. Avoid having throw rugs at the top or bottom of the stairs. If you do have throw rugs, attach them to the floor with carpet tape. Make sure that you have a light switch at the top of the stairs and the bottom of the stairs. If you do not have them, ask someone to add them for you. What else can I do to help prevent  falls? Wear shoes that: Do not have high heels. Have rubber bottoms. Are comfortable and fit you well. Are closed at the toe. Do not wear sandals. If you use a stepladder: Make sure that it is fully opened. Do not climb a closed stepladder. Make sure that both sides of the stepladder are locked into place. Ask someone to hold it for you, if possible. Clearly mark and make sure that you can see: Any grab bars or handrails. First and last steps. Where the edge of each step is. Use tools that help you move around (mobility aids) if they are needed. These include: Canes. Walkers. Scooters. Crutches. Turn on the lights when you go into a dark area. Replace any light bulbs as soon as they burn out. Set up your furniture so you have a clear path. Avoid moving your furniture around. If any of your floors are uneven, fix them. If there are any pets around you, be aware of where they are. Review your medicines with your doctor. Some medicines can make you feel dizzy. This can increase your chance of falling. Ask your doctor what other things that you can do to help prevent falls. This information is not intended to replace advice given to you by your health care provider. Make sure you discuss any questions you have with your health care provider. Document Released: 07/23/2009 Document Revised: 03/03/2016 Document Reviewed: 10/31/2014 Elsevier Interactive Patient Education  2017 [employer].

## 2021-05-05 ENCOUNTER — Other Ambulatory Visit: Payer: Self-pay

## 2021-05-05 ENCOUNTER — Ambulatory Visit (INDEPENDENT_AMBULATORY_CARE_PROVIDER_SITE_OTHER): Payer: Medicare HMO | Admitting: Family Medicine

## 2021-05-05 ENCOUNTER — Encounter: Payer: Self-pay | Admitting: Family Medicine

## 2021-05-05 VITALS — BP 121/66 | HR 51 | Temp 96.6°F | Resp 20 | Ht 71.0 in | Wt 191.0 lb

## 2021-05-05 DIAGNOSIS — M545 Low back pain, unspecified: Secondary | ICD-10-CM

## 2021-05-05 DIAGNOSIS — K219 Gastro-esophageal reflux disease without esophagitis: Secondary | ICD-10-CM

## 2021-05-05 MED ORDER — ESOMEPRAZOLE MAGNESIUM 20 MG PO CPDR: 20.0000 mg | DELAYED_RELEASE_CAPSULE | Freq: Every day | ORAL | 0 refills | Status: DC

## 2021-05-05 MED ORDER — TIZANIDINE HCL 2 MG PO CAPS: 2.0000 mg | ORAL_CAPSULE | Freq: Three times a day (TID) | ORAL | 0 refills | Status: DC

## 2021-05-05 NOTE — Progress Notes (Signed)
Acute Office Visit  Subjective:    Patient ID: Tommy [name], male    DOB: Mar 10, 1951, 70 y.o.   MRN: [id number]  Chief Complaint  Patient presents with   left side pain    HPI Patient is in today for left lower back pain x 1 week. It is constant. The pain is a 9-10/10. He has difficulty describing the pain. It does not radiate. It gets worse with activity or bending. He has been taking advil and a pain away which has tylenol, aspirin, and caffeine in it. The pain away has been helpful. It feel tight at times, like a spasm. Denies fever, injury, nausea, vomiting, saddle anesthesia, numbness, tingling, or urinary symptoms.   He is having an increase in acid reflux symptoms for the last week. He reports regurgitation and heartburn. He used to Nexium and that was helpful.   Past Medical History:  Diagnosis Date   BPH with obstruction/lower urinary tract symptoms    hyperplasia   ED (erectile dysfunction)    arterial insuff.   History of DVT of lower extremity    History of gastric ulcer    Hyperlipidemia    Hypertension    Peripheral vascular disease ([place])    Prostate cancer ([place]) DX 01/01/15--  urologist-  dr Jeffie Pollock   Stage T1c, Gleason 3+4, PSA 5.73, vol 60m   Wears glasses     Past Surgical History:  Procedure Laterality Date   FEMORAL ARTERY STENT Left 07-22-2008  &  11-16-2010   left SFA//   In-Stent restenosis   ORIF LEFT INDEX FINGER FX  09-28-2006   PROSTATE BIOPSY  01/01/15   PROSTATE SURGERY  July 2016   Seed Implant   RADIOACTIVE SEED IMPLANT N/A 04/23/2015   Procedure: RADIOACTIVE SEED IMPLANT/BRACHYTHERAPY IMPLANT;  Surgeon: JIrine Seal MD;  [facility]  Service: Urology;  Laterality: N/A;   TONSILLECTOMY  as child    Family History  Problem Relation Age of Onset   Heart disease Mother    Cancer Mother    Deep vein thrombosis Mother    Diabetes Mother    Hyperlipidemia Mother    Hypertension Mother    Heart attack Mother     Peripheral vascular disease Mother    Cancer Father    Colon cancer Neg Hx     Social History   Socioeconomic History   Marital status: Married    Spouse name: PSilva Bandy  Number of children: 3   Years of education: Not on file   Highest education level: Not on file  Occupational History    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  Tobacco Use   Smoking status: Former    Packs/day: 1.00    Years: 15.00    Pack years: 15.00    Types: Cigarettes    Start date: 09/09/2010    Quit date: 04/21/2011    Years since quitting: 10.0   Smokeless tobacco: Current    Types: Snuff   Tobacco comments:    occasional  someday dips tobacco  Vaping Use   Vaping Use: Never used  Substance and Sexual Activity   Alcohol use: No    Alcohol/week: 0.0 standard drinks   Drug use: No   Sexual activity: Not on file  Other Topics Concern   Not on file  Social History Narrative   2 daughters and one son - all live nearby   Social Determinants of HRadio broadcast assistant  Strain: Low Risk    Difficulty of Paying Living Expenses: Not hard at all  Food Insecurity: No Food Insecurity   Worried About Charity fundraiser in the Last Year: Never true   Ran Out of Food in the Last Year: Never true  Transportation Needs: No Transportation Needs   Lack of Transportation (Medical): No   Lack of Transportation (Non-Medical): No  Physical Activity: Sufficiently Active   Days of Exercise per Week: 7 days   Minutes of Exercise per Session: 40 min  Stress: No Stress Concern Present   Feeling of Stress : Not at all  Social Connections: Socially Integrated   Frequency of Communication with Friends and Family: More than three times a week   Frequency of Social Gatherings with Friends and Family: More than three times a week   Attends Religious Services: 1 to 4 times per year   Active Member of [employer] or Organizations: Yes   Attends Archivist Meetings: 1 to 4 times per year    Marital Status: Married  Human resources officer Violence: Not At Risk   Fear of Current or Ex-Partner: No   Emotionally Abused: No   Physically Abused: No   Sexually Abused: No    Outpatient Medications Prior to Visit  Medication Sig Dispense Refill   amLODipine (NORVASC) 10 MG tablet Take 1 tablet (10 mg total) by mouth daily. 90 tablet 3   clopidogrel (PLAVIX) 75 MG tablet Take 1 tablet (75 mg total) by mouth daily. 90 tablet 3   fenofibrate (TRICOR) 145 MG tablet Take 1 tablet (145 mg total) by mouth daily. 90 tablet 3   hydrochlorothiazide (HYDRODIURIL) 25 MG tablet Take 1 tablet (25 mg total) by mouth daily. 90 tablet 3   rosuvastatin (CRESTOR) 10 MG tablet Take 1 tablet (10 mg total) by mouth every evening. 90 tablet 3   sodium chloride (OCEAN) 0.65 % SOLN nasal spray Place 1 spray into both nostrils as needed for congestion. 60 mL 1   tamsulosin (FLOMAX) 0.4 MG CAPS capsule Take one capsule daily after supper 90 capsule 3   acetaminophen (TYLENOL) 500 MG tablet Take 1 tablet (500 mg total) by mouth every 6 (six) hours as needed. (Patient not taking: Reported on 05/05/2021) 30 tablet 0   No facility-administered medications prior to visit.    Allergies  Allergen Reactions   Penicillins Swelling    Has patient had a PCN reaction causing immediate rash, facial/tongue/throat swelling, SOB or lightheadedness with hypotension: No Has patient had a PCN reaction causing severe rash involving mucus membranes or skin necrosis: No Has patient had a PCN reaction that required hospitalization No Has patient had a PCN reaction occurring within the last 10 years: No If all of the above answers are "NO", then may proceed with Cephalosporin use.    Review of Systems As per HPI.     Objective:    Physical Exam Vitals and nursing note reviewed.  Constitutional:      General: He is not in acute distress.    Appearance: He is not ill-appearing, toxic-appearing or diaphoretic.  Cardiovascular:      Rate and Rhythm: Normal rate and regular rhythm.     Heart sounds: Normal heart sounds. No murmur heard. Pulmonary:     Effort: Pulmonary effort is normal. No respiratory distress.     Breath sounds: Normal breath sounds.  Abdominal:     General: Bowel sounds are normal. There is no distension.     Palpations: Abdomen  is soft.     Tenderness: There is no abdominal tenderness. There is no right CVA tenderness, left CVA tenderness, guarding or rebound.  Musculoskeletal:     Lumbar back: Tenderness (left paraspinal) present. No swelling, edema, deformity, signs of trauma, lacerations, spasms or bony tenderness. Negative right straight leg raise test and negative left straight leg raise test.     Right lower leg: No edema.     Left lower leg: No edema.  Skin:    General: Skin is warm and dry.  Neurological:     General: No focal deficit present.     Mental Status: He is alert and oriented to person, place, and time.  Psychiatric:        Mood and Affect: Mood normal.        Behavior: Behavior normal.    BP 121/66   Pulse (!) 51   Temp (!) 96.6 F (35.9 C) (Temporal)   Resp 20   Ht _0  (1.803 m)   Wt 191 lb (86.6 kg)   SpO2 95%   BMI 26.64 kg/m  Wt Readings from Last 3 Encounters:  05/05/21 191 lb (86.6 kg)  04/30/21 189 lb (85.7 kg)  04/28/21 192 lb (87.1 kg)    Health Maintenance Due  Topic Date Due   Zoster Vaccines- Shingrix (1 of 2) Never done   COLONOSCOPY (Pts 45-70yr Insurance coverage will need to be confirmed)  07/11/2015   COVID-19 Vaccine (4 - Booster for Moderna series) 01/13/2021    There are no preventive care reminders to display for this patient.   Lab Results  Component Value Date   TSH 0.961 07/03/2019   Lab Results  Component Value Date   WBC 4.5 04/28/2021   HGB 15.3 04/28/2021   HCT 45.1 04/28/2021   MCV 87 04/28/2021   PLT 214 04/28/2021   Lab Results  Component Value Date   NA 146 (H) 04/28/2021   K 5.5 (H) 04/28/2021   CO2  27 04/28/2021   GLUCOSE 111 (H) 04/28/2021   BUN 16 04/28/2021   CREATININE 1.22 04/28/2021   BILITOT 0.7 04/28/2021   ALKPHOS 42 (L) 04/28/2021   AST 23 04/28/2021   ALT 30 04/28/2021   PROT 7.1 04/28/2021   ALBUMIN 4.6 04/28/2021   CALCIUM 11.6 (H) 04/28/2021   ANIONGAP 5 04/16/2015   EGFR 63 04/28/2021   Lab Results  Component Value Date   CHOL 124 04/28/2021   Lab Results  Component Value Date   HDL 42 04/28/2021   Lab Results  Component Value Date   LDLCALC 68 04/28/2021   Lab Results  Component Value Date   TRIG 69 04/28/2021   Lab Results  Component Value Date   CHOLHDL 3.0 04/28/2021   Lab Results  Component Value Date   HGBA1C 5.9 04/28/2021       Assessment & Plan:   JUgowas seen today for left side pain.  Diagnoses and all orders for this visit:  Acute left-sided low back pain without sciatica Lumbar strain. Zanaflex as needed for spasms. Discusses stretching, heat, ice, muscle rubs, tylenol for pain.  -     tizanidine (ZANAFLEX) 2 MG capsule; Take 1 capsule (2 mg total) by mouth 3 (three) times daily.  GERD without esophagitis Recent worsening with advil use. Nexium ordered as he previously did well on this. No red flags today.  -     esomeprazole (NEXIUM) 20 MG capsule; Take 1 capsule (20 mg total) by mouth daily  before breakfast.  Return to office for new or worsening symptoms, or if symptoms persist.   The patient indicates understanding of these issues and agrees with the plan.  Gwenlyn Perking, FNP

## 2021-05-05 NOTE — Patient Instructions (Signed)
Lumbar Strain A lumbar strain, which is sometimes called a low-back strain, is a stretch or tear in a muscle or the strong cords of tissue that attach muscle to bone (tendons) in the lower back (lumbar spine). This type of injury occurs when muscles or tendons are torn or are stretchedbeyond their limits. Lumbar strains can range from mild to severe. Mild strains may involve stretching a muscle or tendon without tearing it. These may heal in 1-2 weeks. More severe strains involve tearing of muscle fibers or tendons. These willcause more pain and may take 6-8 weeks to heal. What are the causes? This condition may be caused by: Trauma, such as a fall or a hit to the body. Twisting or overstretching the back. This may result from doing activities that need a lot of energy, such as lifting heavy objects. What increases the risk? This injury is more common in: Athletes. People with obesity. People who do repeated lifting, bending, or other movements that involve their back. What are the signs or symptoms? Symptoms of this condition may include: Sharp or dull pain in the lower back that does not go away. The pain may extend to the buttocks. Stiffness or limited range of motion. Sudden muscle tightening (spasms). How is this diagnosed? This condition may be diagnosed based on: Your symptoms. Your medical history. A physical exam. Imaging tests, such as: X-rays. MRI. How is this treated? Treatment for this condition may include: Rest. Applying heat and cold to the affected area. Over-the-counter medicines to help relieve pain and inflammation, such as NSAIDs. Prescription pain medicine and muscle relaxants may be needed for a short time. Physical therapy. Follow these instructions at home: Managing pain, stiffness, and swelling     If directed, put ice on the injured area during the first 24 hours after your injury. Put ice in a plastic bag. Place a towel between your skin and the  bag. Leave the ice on for 20 minutes, 2-3 times a day. If directed, apply heat to the affected area as often as told by your health care provider. Use the heat source that your health care provider recommends, such as a moist heat pack or a heating pad. Place a towel between your skin and the heat source. Leave the heat on for 20-30 minutes. Remove the heat if your skin turns bright red. This is especially important if you are unable to feel pain, heat, or cold. You may have a greater risk of getting burned. Activity Rest and return to your normal activities as told by your health care provider. Ask your health care provider what activities are safe for you. Do exercises as told by your health care provider. Medicines Take over-the-counter and prescription medicines only as told by your health care provider. Ask your health care provider if the medicine prescribed to you: Requires you to avoid driving or using heavy machinery. Can cause constipation. You may need to take these actions to prevent or treat constipation: Drink enough fluid to keep your urine pale yellow. Take over-the-counter or prescription medicines. Eat foods that are high in fiber, such as beans, whole grains, and fresh fruits and vegetables. Limit foods that are high in fat and processed sugars, such as fried or sweet foods. Injury prevention To prevent a future low-back injury: Always warm up properly before physical activity or sports. Cool down and stretch after being active. Use correct form when playing sports and lifting heavy objects. Bend your knees before you lift heavy objects. Use  good posture when sitting and standing. Stay physically fit and keep a healthy weight. Do at least 150 minutes of moderate-intensity exercise each week, such as brisk walking or water aerobics. Do strength exercises at least 2 times each week.  General instructions Do not use any products that contain nicotine or tobacco, such as  cigarettes, e-cigarettes, and chewing tobacco. If you need help quitting, ask your health care provider. Keep all follow-up visits as told by your health care provider. This is important. Contact a health care provider if: Your back pain does not improve after 6 weeks of treatment. Your symptoms get worse. Get help right away if: Your back pain is severe. You are unable to stand or walk. You develop pain in your legs. You develop weakness in your buttocks or legs. You have difficulty controlling when you urinate or when you have a bowel movement. You have frequent, painful, or bloody urination. You have a temperature over 101.24F (38.3C) Summary A lumbar strain, which is sometimes called a low-back strain, is a stretch or tear in a muscle or the strong cords of tissue that attach muscle to bone (tendons) in the lower back (lumbar spine). This type of injury occurs when muscles or tendons are torn or are stretched beyond their limits. Rest and return to your normal activities as told by your health care provider. If directed, apply heat and ice to the affected area as often as told by your health care provider. Take over-the-counter and prescription medicines only as told by your health care provider. Contact a health care provider if you have new or worsening symptoms. This information is not intended to replace advice given to you by your health care provider. Make sure you discuss any questions you have with your healthcare provider. Document Revised: 07/26/2018 Document Reviewed: 07/26/2018 Elsevier Patient Education  2022 [employer].

## 2021-06-02 ENCOUNTER — Encounter: Payer: Self-pay | Admitting: Family Medicine

## 2021-06-02 ENCOUNTER — Other Ambulatory Visit: Payer: Self-pay

## 2021-06-02 ENCOUNTER — Ambulatory Visit (INDEPENDENT_AMBULATORY_CARE_PROVIDER_SITE_OTHER): Payer: Medicare HMO

## 2021-06-02 ENCOUNTER — Ambulatory Visit (INDEPENDENT_AMBULATORY_CARE_PROVIDER_SITE_OTHER): Payer: Medicare HMO | Admitting: Family Medicine

## 2021-06-02 VITALS — BP 129/78 | HR 63 | Temp 98.4°F | Ht 71.0 in | Wt 192.6 lb

## 2021-06-02 DIAGNOSIS — M5442 Lumbago with sciatica, left side: Secondary | ICD-10-CM

## 2021-06-02 DIAGNOSIS — G8929 Other chronic pain: Secondary | ICD-10-CM

## 2021-06-02 DIAGNOSIS — R109 Unspecified abdominal pain: Secondary | ICD-10-CM | POA: Diagnosis not present

## 2021-06-02 DIAGNOSIS — M545 Low back pain, unspecified: Secondary | ICD-10-CM

## 2021-06-02 DIAGNOSIS — M47816 Spondylosis without myelopathy or radiculopathy, lumbar region: Secondary | ICD-10-CM | POA: Diagnosis not present

## 2021-06-02 LAB — URINALYSIS, COMPLETE
Bilirubin, UA: NEGATIVE
Glucose, UA: NEGATIVE
Leukocytes,UA: NEGATIVE
Nitrite, UA: NEGATIVE
Protein,UA: NEGATIVE
RBC, UA: NEGATIVE
Specific Gravity, UA: >1.03 — ABNORMAL HIGH (ref 1.005–1.030)
Urobilinogen, Ur: 4 mg/dL — ABNORMAL HIGH (ref 0.2–1.0)
pH, UA: 5 (ref 5.0–7.5)

## 2021-06-02 LAB — MICROSCOPIC EXAMINATION
Bacteria, UA: NONE SEEN
Renal Epithel, UA: NONE SEEN /hpf
WBC, UA: NONE SEEN /hpf (ref 0–5)

## 2021-06-02 MED ORDER — PREDNISONE 20 MG PO TABS: 40.0000 mg | ORAL_TABLET | Freq: Every day | ORAL | 0 refills | Status: AC

## 2021-06-02 NOTE — Progress Notes (Signed)
Subjective:  Patient ID: Tommy [name], male    DOB: [birth year], 70 y.o.   MRN: [id number]  Patient Care Team: [name], Fransisca Kaufmann, MD as PCP - General (Family Medicine) Eloise Harman, DO as Consulting Physician (Internal Medicine) Tommy Seal, MD as Attending Physician (Urology)   Chief Complaint:  Back Pain (That radiates down left leg)   HPI: Tommy [name] is a 70 y.o. male presenting on 06/02/2021 for Back Pain (That radiates down left leg)   Pt presents today for reevaluation of back pain. He was evaluated last month for the same and was placed on muscle relaxants. He states the pain has not improved and is now radiating to his left flank and down his left leg. Upon reviewing prior imaging, it was noted in 2007 that pt may have ankylosing spondylitis. He states he has never been told this or evaluated for this. He states he did have a fall in 03/2021 but did not think he was injured.    Back Pain This is a chronic problem. The current episode started more than 1 month ago. The problem occurs constantly. The problem has been waxing and waning since onset. The pain is present in the lumbar spine, gluteal and sacro-iliac. The quality of the pain is described as aching, burning, shooting and stabbing. The pain radiates to the left thigh. The pain is at a severity of 6/10. The pain is moderate. The symptoms are aggravated by bending, position, lying down, standing and sitting. Associated symptoms include leg pain, paresthesias and tingling. Pertinent negatives include no abdominal pain, bladder incontinence, bowel incontinence, chest pain, dysuria, fever, headaches, numbness, paresis, pelvic pain, perianal numbness, weakness or weight loss. He has tried muscle relaxant for the symptoms. The treatment provided no relief.     Relevant past medical, surgical, family, and social history reviewed and updated as indicated.  Allergies and medications reviewed and updated. Data reviewed: Chart in  Epic.   Past Medical History:  Diagnosis Date   BPH with obstruction/lower urinary tract symptoms    hyperplasia   ED (erectile dysfunction)    arterial insuff.   History of DVT of lower extremity    History of gastric ulcer    Hyperlipidemia    Hypertension    Peripheral vascular disease ([place])    Prostate cancer ([place]) DX 01/01/15--  urologist-  dr Tommy [name]   Stage T1c, Gleason 3+4, PSA 5.73, vol 46m   Wears glasses     Past Surgical History:  Procedure Laterality Date   FEMORAL ARTERY STENT Left 07-22-2008  &  11-16-2010   left SFA//   In-Stent restenosis   ORIF LEFT INDEX FINGER FX  09-28-2006   PROSTATE BIOPSY  01/01/15   PROSTATE SURGERY  July 2016   Seed Implant   RADIOACTIVE SEED IMPLANT N/A 04/23/2015   Procedure: RADIOACTIVE SEED IMPLANT/BRACHYTHERAPY IMPLANT;  Surgeon: JIrine Seal MD;  Location: [facility]  Service: Urology;  Laterality: N/A;   TONSILLECTOMY  as child    Social History   Socioeconomic History   Marital status: Married    Spouse name: Tommy [name]  Number of children: 3   Years of education: Not on file   Highest education level: Not on file  Occupational History    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, multimedia Tobacco Use   Smoking status: Former    Packs/day: 1.00    Years: 15.00    Pack years: 15.00  Types: Cigarettes    Start date: 09/09/2010    Quit date: 04/21/2011    Years since quitting: 10.1   Smokeless tobacco: Current    Types: Snuff   Tobacco comments:    occasional  someday dips tobacco  Vaping Use   Vaping Use: Never used  Substance and Sexual Activity   Alcohol use: No    Alcohol/week: 0.0 standard drinks   Drug use: No   Sexual activity: Not on file  Other Topics Concern   Not on file  Social History Narrative   2 daughters and one son - all live nearby   Social Determinants of Health   Financial Resource Strain: Low Risk    Difficulty of Paying Living Expenses: Not hard at all   Food Insecurity: No Food Insecurity   Worried About Charity fundraiser in the Last Year: Never true   Arboriculturist in the Last Year: Never true  Transportation Needs: No Transportation Needs   Lack of Transportation (Medical): No   Lack of Transportation (Non-Medical): No  Physical Activity: Sufficiently Active   Days of Exercise per Week: 7 days   Minutes of Exercise per Session: 40 min  Stress: No Stress Concern Present   Feeling of Stress : Not at all  Social Connections: Socially Integrated   Frequency of Communication with Friends and Family: More than three times a week   Frequency of Social Gatherings with Friends and Family: More than three times a week   Attends Religious Services: 1 to 4 times per year   Active Member of [employer] or Organizations: Yes   Attends Archivist Meetings: 1 to 4 times per year   Marital Status: Married  Human resources officer Violence: Not At Risk   Fear of Current or Ex-Partner: No   Emotionally Abused: No   Physically Abused: No   Sexually Abused: No    Outpatient Encounter Medications as of 06/02/2021  Medication Sig   amLODipine (NORVASC) 10 MG tablet Take 1 tablet (10 mg total) by mouth daily.   clopidogrel (PLAVIX) 75 MG tablet Take 1 tablet (75 mg total) by mouth daily.   esomeprazole (NEXIUM) 20 MG capsule Take 1 capsule (20 mg total) by mouth daily before breakfast.   fenofibrate (TRICOR) 145 MG tablet Take 1 tablet (145 mg total) by mouth daily.   hydrochlorothiazide (HYDRODIURIL) 25 MG tablet Take 1 tablet (25 mg total) by mouth daily.   predniSONE (DELTASONE) 20 MG tablet Take 2 tablets (40 mg total) by mouth daily with breakfast for 5 days.   rosuvastatin (CRESTOR) 10 MG tablet Take 1 tablet (10 mg total) by mouth every evening.   sodium chloride (OCEAN) 0.65 % SOLN nasal spray Place 1 spray into both nostrils as needed for congestion.   tamsulosin (FLOMAX) 0.4 MG CAPS capsule Take one capsule daily after supper   tizanidine  (ZANAFLEX) 2 MG capsule Take 1 capsule (2 mg total) by mouth 3 (three) times daily.   acetaminophen (TYLENOL) 500 MG tablet Take 1 tablet (500 mg total) by mouth every 6 (six) hours as needed. (Patient not taking: Reported on 06/02/2021)   No facility-administered encounter medications on file as of 06/02/2021.    Allergies  Allergen Reactions   Penicillins Swelling    Has patient had a PCN reaction causing immediate rash, facial/tongue/throat swelling, SOB or lightheadedness with hypotension: No Has patient had a PCN reaction causing severe rash involving mucus membranes or skin necrosis: No Has patient had a  PCN reaction that required hospitalization No Has patient had a PCN reaction occurring within the last 10 years: No If all of the above answers are "NO", then may proceed with Cephalosporin use.    Review of Systems  Constitutional:  Negative for activity change, appetite change, chills, diaphoresis, fatigue, fever, unexpected weight change and weight loss.  HENT: Negative.    Eyes: Negative.   Respiratory:  Negative for cough, chest tightness and shortness of breath.   Cardiovascular:  Negative for chest pain, palpitations and leg swelling.  Gastrointestinal:  Negative for abdominal pain, blood in stool, bowel incontinence, constipation, diarrhea, nausea and vomiting.  Endocrine: Negative.   Genitourinary:  Negative for bladder incontinence, decreased urine volume, difficulty urinating, dysuria, frequency, pelvic pain and urgency.  Musculoskeletal:  Positive for arthralgias, back pain, gait problem and myalgias. Negative for joint swelling, neck pain and neck stiffness.  Skin: Negative.   Allergic/Immunologic: Negative.   Neurological:  Positive for tingling and paresthesias. Negative for dizziness, tremors, seizures, syncope, facial asymmetry, speech difficulty, weakness, light-headedness, numbness and headaches.  Hematological: Negative.   Psychiatric/Behavioral:  Negative for  confusion, hallucinations, sleep disturbance and suicidal ideas.   All other systems reviewed and are negative.      Objective:  BP 129/78   Pulse 63   Temp 98.4 F (36.9 C) (Temporal)   Ht '5\' 11"'$  (1.803 m)   Wt 192 lb 9.6 oz (87.4 kg)   SpO2 96%   BMI 26.86 kg/m    Wt Readings from Last 3 Encounters:  06/02/21 192 lb 9.6 oz (87.4 kg)  05/05/21 191 lb (86.6 kg)  04/30/21 189 lb (85.7 kg)    Physical Exam Vitals and nursing note reviewed.  Constitutional:      General: He is not in acute distress.    Appearance: Normal appearance. He is well-developed, well-groomed and overweight. He is not ill-appearing, toxic-appearing or diaphoretic.  HENT:     Head: Normocephalic and atraumatic.     Jaw: There is normal jaw occlusion.     Right Ear: Hearing normal.     Left Ear: Hearing normal.     Nose: Nose normal.     Mouth/Throat:     Lips: Pink.     Mouth: Mucous membranes are moist.     Pharynx: Oropharynx is clear. Uvula midline.  Eyes:     General: Lids are normal.     Extraocular Movements: Extraocular movements intact.     Conjunctiva/sclera: Conjunctivae normal.     Pupils: Pupils are equal, round, and reactive to light.  Neck:     Thyroid: No thyroid mass, thyromegaly or thyroid tenderness.     Vascular: No carotid bruit or JVD.     Trachea: Trachea and phonation normal.  Cardiovascular:     Rate and Rhythm: Normal rate and regular rhythm.     Chest Wall: PMI is not displaced.     Pulses: Normal pulses.     Heart sounds: Normal heart sounds. No murmur heard.   No friction rub. No gallop.  Pulmonary:     Effort: Pulmonary effort is normal. No respiratory distress.     Breath sounds: Normal breath sounds. No wheezing.  Abdominal:     General: Bowel sounds are normal. There is no distension or abdominal bruit.     Palpations: Abdomen is soft. There is no hepatomegaly or splenomegaly.     Tenderness: There is no abdominal tenderness. There is no right CVA  tenderness or left CVA tenderness.  Hernia: No hernia is present.  Musculoskeletal:     Cervical back: Normal, normal range of motion and neck supple.     Thoracic back: Normal.     Lumbar back: Tenderness present. No swelling, edema, deformity, signs of trauma, lacerations, spasms or bony tenderness. Decreased range of motion. Positive left straight leg raise test. Negative right straight leg raise test. No scoliosis.     Right hip: Normal.     Left hip: Tenderness present. No deformity, lacerations, bony tenderness or crepitus. Decreased range of motion. Normal strength.     Right upper leg: Normal.     Left upper leg: Normal.     Right lower leg: No edema.     Left lower leg: No edema.  Lymphadenopathy:     Cervical: No cervical adenopathy.  Skin:    General: Skin is warm and dry.     Capillary Refill: Capillary refill takes less than 2 seconds.     Coloration: Skin is not cyanotic, jaundiced or pale.     Findings: No rash.  Neurological:     General: No focal deficit present.     Mental Status: He is alert and oriented to person, place, and time.     Cranial Nerves: Cranial nerves are intact. No cranial nerve deficit.     Sensory: Sensation is intact. No sensory deficit.     Motor: Motor function is intact. No weakness.     Coordination: Coordination is intact. Coordination normal.     Gait: Gait abnormal (antalgic).     Deep Tendon Reflexes: Reflexes are normal and symmetric. Reflexes normal.  Psychiatric:        Attention and Perception: Attention and perception normal.        Mood and Affect: Mood and affect normal.        Speech: Speech normal.        Behavior: Behavior normal. Behavior is cooperative.        Thought Content: Thought content normal.        Cognition and Memory: Cognition and memory normal.        Judgment: Judgment normal.    Results for orders placed or performed in visit on 06/02/21  Microscopic Examination   Urine  Result Value Ref Range   WBC,  UA None seen 0 - 5 /hpf   RBC 0-2 0 - 2 /hpf   Epithelial Cells (non renal) 0-10 0 - 10 /hpf   Renal Epithel, UA None seen None seen /hpf   Mucus, UA Present (A) Not Estab.   Bacteria, UA None seen None seen/Few  Urinalysis, Complete  Result Value Ref Range   Specific Gravity, UA >1.030 (H) 1.005 - 1.030   pH, UA 5.0 5.0 - 7.5   Color, UA Yellow Yellow   Appearance Ur Clear Clear   Leukocytes,UA Negative Negative   Protein,UA Negative Negative/Trace   Glucose, UA Negative Negative   Ketones, UA Trace (A) Negative   RBC, UA Negative Negative   Bilirubin, UA Negative Negative   Urobilinogen, Ur 4.0 (H) 0.2 - 1.0 mg/dL   Nitrite, UA Negative Negative   Microscopic Examination See below:      Urinalysis in office unremarkable.  X-Ray: lumbar spine: No acute findings, degenerative changes noted. Preliminary x-ray reading by Monia Pouch, FNP-C, [facility].   Pertinent labs & imaging results that were available during my care of the patient were reviewed by me and considered in my medical decision making.  Assessment & Plan:  Sonia Side  was seen today for back pain.  Diagnoses and all orders for this visit:  Left flank pain Urinalysis unremarkable in office.  -     Urinalysis, Complete -     Microscopic Examination  Chronic left-sided low back pain with left-sided sciatica Imaging without acute findings, degenerative changes noted. No red flags concerning for cauda equina syndrome. Prior CT pelvis concerning for ankylosing spondylitis. Will burst with steroids and refer to orthopedic for further evaluation and management due to ongoing symptoms.  -     DG Lumbar Spine 2-3 Views; Future -     Ambulatory referral to Orthopedic Surgery -     predniSONE (DELTASONE) 20 MG tablet; Take 2 tablets (40 mg total) by mouth daily with breakfast for 5 days.    Continue all other maintenance medications.  Follow up plan: Return in about 6 weeks (around 07/14/2021), or if symptoms worsen or fail to  improve.   Educational handout given for chronic back pain  The above assessment and management plan was discussed with the patient. The patient verbalized understanding of and has agreed to the management plan. Patient is aware to call the clinic if they develop any new symptoms or if symptoms persist or worsen. Patient is aware when to return to the clinic for a follow-up visit. Patient educated on when it is appropriate to go to the emergency department.   Monia Pouch, FNP-C [facility] [phone]

## 2021-06-02 NOTE — Patient Instructions (Signed)
ch

## 2021-06-04 ENCOUNTER — Telehealth: Payer: Self-pay | Admitting: Family Medicine

## 2021-06-04 NOTE — Telephone Encounter (Signed)
Yvone Neu had tried to call patient about xray results.  Tried to call again, line busy.

## 2021-06-08 NOTE — Telephone Encounter (Signed)
Was never able to reach patient by phone, his brother was informed of results per [facility]

## 2021-06-17 ENCOUNTER — Other Ambulatory Visit: Payer: Self-pay

## 2021-06-17 ENCOUNTER — Encounter: Payer: Self-pay | Admitting: Orthopaedic Surgery

## 2021-06-17 ENCOUNTER — Ambulatory Visit: Payer: Medicare HMO | Admitting: Orthopaedic Surgery

## 2021-06-17 ENCOUNTER — Telehealth: Payer: Self-pay | Admitting: Orthopaedic Surgery

## 2021-06-17 VITALS — BP 180/96 | HR 65 | Ht 71.5 in | Wt 189.4 lb

## 2021-06-17 DIAGNOSIS — M545 Low back pain, unspecified: Secondary | ICD-10-CM | POA: Diagnosis not present

## 2021-06-17 DIAGNOSIS — M79605 Pain in left leg: Secondary | ICD-10-CM | POA: Diagnosis not present

## 2021-06-17 NOTE — Progress Notes (Signed)
Subjective:    Patient ID: Tommy [name], male    DOB: Apr 21, 1951, 70 y.o.   MRN: [id number]  HPI He has seven to eight week history of increasing lower back pain with left sided paresthesias.  He has no trauma.  He said he just got up one morning and had the pain.  He is not improving.  He has tried Advil, heat and ice.  He has seen his primary care and had prednisone which helped.  He had X-rays of the lumbar spine which showed: IMPRESSION: 1. No convincing acute fracture or acute finding. 2. Mild wedge-shaped compression deformity of L1 appears chronic. 3. Degenerative changes throughout the visualized spine as described.   He denies any prior trauma.  I have independently reviewed and interpreted x-rays of this patient done at another site by another physician or qualified health professional.  He has no weakness, no bowel or bladder problems.  He has more pain with activity and prolonged sitting.   Review of Systems  Constitutional:  Positive for activity change.  Musculoskeletal:  Positive for arthralgias and back pain.  All other systems reviewed and are negative. For Review of Systems, all other systems reviewed and are negative.  The following is a summary of the past history medically, past history surgically, known current medicines, social history and family history.  This information is gathered electronically by the computer from prior information and documentation.  I review this each visit and have found including this information at this point in the chart is beneficial and informative.   Past Medical History:  Diagnosis Date   BPH with obstruction/lower urinary tract symptoms    hyperplasia   ED (erectile dysfunction)    arterial insuff.   History of DVT of lower extremity    History of gastric ulcer    Hyperlipidemia    Hypertension    Peripheral vascular disease ([place])    Prostate cancer ([place]) DX 01/01/15--  urologist-  dr Jeffie Pollock   Stage T1c, Gleason 3+4, PSA  5.73, vol 24m   Wears glasses     Past Surgical History:  Procedure Laterality Date   FEMORAL ARTERY STENT Left 07-22-2008  &  11-16-2010   left SFA//   In-Stent restenosis   ORIF LEFT INDEX FINGER FX  09-28-2006   PROSTATE BIOPSY  01/01/15   PROSTATE SURGERY  July 2016   Seed Implant   RADIOACTIVE SEED IMPLANT N/A 04/23/2015   Procedure: RADIOACTIVE SEED IMPLANT/BRACHYTHERAPY IMPLANT;  Surgeon: JIrine Seal MD;  Location: [facility]  Service: Urology;  Laterality: N/A;   TONSILLECTOMY  as child    Current Outpatient Medications on File Prior to Visit  Medication Sig Dispense Refill   acetaminophen (TYLENOL) 500 MG tablet Take 1 tablet (500 mg total) by mouth every 6 (six) hours as needed. 30 tablet 0   amLODipine (NORVASC) 10 MG tablet Take 1 tablet (10 mg total) by mouth daily. 90 tablet 3   clopidogrel (PLAVIX) 75 MG tablet Take 1 tablet (75 mg total) by mouth daily. 90 tablet 3   esomeprazole (NEXIUM) 20 MG capsule Take 1 capsule (20 mg total) by mouth daily before breakfast. 90 capsule 0   fenofibrate (TRICOR) 145 MG tablet Take 1 tablet (145 mg total) by mouth daily. 90 tablet 3   hydrochlorothiazide (HYDRODIURIL) 25 MG tablet Take 1 tablet (25 mg total) by mouth daily. 90 tablet 3   rosuvastatin (CRESTOR) 10 MG tablet Take 1 tablet (10 mg total) by mouth  every evening. 90 tablet 3   sodium chloride (OCEAN) 0.65 % SOLN nasal spray Place 1 spray into both nostrils as needed for congestion. 60 mL 1   tamsulosin (FLOMAX) 0.4 MG CAPS capsule Take one capsule daily after supper 90 capsule 3   tizanidine (ZANAFLEX) 2 MG capsule Take 1 capsule (2 mg total) by mouth 3 (three) times daily. 45 capsule 0   No current facility-administered medications on file prior to visit.    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  Tobacco Use   Smoking status: Former    Packs/day: 1.00    Years: 15.00    Pack years: 15.00    Types: Cigarettes    Start date: 09/09/2010    Quit date: 04/21/2011    Years since quitting: 10.1   Smokeless tobacco: Current    Types: Snuff   Tobacco comments:    occasional  someday dips tobacco  Vaping Use   Vaping Use: Never used  Substance and Sexual Activity   Alcohol use: No    Alcohol/week: 0.0 standard drinks   Drug use: No   Sexual activity: Not on file  Other Topics Concern   Not on file  Social History Narrative   2 daughters and one son - all live nearby   Social Determinants of Health   Financial Resource Strain: Low Risk    Difficulty of Paying Living Expenses: Not hard at all  Food Insecurity: No Food Insecurity   Worried About Charity fundraiser in the Last Year: Never true   Arboriculturist in the Last Year: Never true  Transportation Needs: No Transportation Needs   Lack of Transportation (Medical): No   Lack of Transportation (Non-Medical): No  Physical Activity: Sufficiently Active   Days of Exercise per Week: 7 days   Minutes of Exercise per Session: 40 min  Stress: No Stress Concern Present   Feeling of Stress : Not at all  Social Connections: Socially Integrated   Frequency of Communication with Friends and Family: More than three times a week   Frequency of Social Gatherings with Friends and Family: More than three times a week   Attends Religious Services: 1 to 4 times per year   Active Member of [employer] or Organizations: Yes   Attends Archivist Meetings: 1 to 4 times per year   Marital Status: Married  Human resources officer Violence: Not At Risk   Fear of Current or Ex-Partner: No   Emotionally Abused: No   Physically Abused: No   Sexually Abused: No    Family History  Problem Relation Age of Onset   Heart disease Mother    Cancer Mother     Deep vein thrombosis Mother    Diabetes Mother    Hyperlipidemia Mother    Hypertension Mother    Heart attack Mother    Peripheral vascular disease Mother    Cancer Father    Colon cancer Neg Hx     BP (!) 180/96   Pulse 65   Ht 5' 11.5" (1.816 m)   Wt 189 lb 6.4 oz (85.9 kg)   BMI 26.05 kg/m   Body mass index is 26.05 kg/m.  Objective:   Physical Exam Vitals and nursing note reviewed. Exam conducted with a chaperone present.  Constitutional:      Appearance: He is well-developed.  HENT:     Head: Normocephalic and atraumatic.  Eyes:     Conjunctiva/sclera: Conjunctivae normal.     Pupils: Pupils are equal, round, and reactive to light.  Cardiovascular:     Rate and Rhythm: Normal rate and regular rhythm.  Pulmonary:     Effort: Pulmonary effort is normal.  Abdominal:     Palpations: Abdomen is soft.  Musculoskeletal:       Arms:     Cervical back: Normal range of motion and neck supple.  Skin:    General: Skin is warm and dry.  Neurological:     Mental Status: He is alert and oriented to person, place, and time.     Cranial Nerves: No cranial nerve deficit.     Motor: No abnormal muscle tone.     Coordination: Coordination normal.     Deep Tendon Reflexes: Reflexes are normal and symmetric. Reflexes normal.  Psychiatric:        Behavior: Behavior normal.        Thought Content: Thought content normal.        Judgment: Judgment normal.          Assessment & Plan:   Encounter Diagnosis  Name Primary?   Lumbar pain with radiation down left leg Yes   Begin Naprosyn 500 po bid pc.  Get MRI of lumbar spine.  I am concerned about HNP.  Call if any problem.  Precautions discussed.  Electronically Signed Sanjuana Kava, MD 9/8/20228:24 AM

## 2021-06-17 NOTE — Telephone Encounter (Signed)
Patient was seen this morning.  He was under the assumption that you were going to send in a prescription for him but the pharmacy states they dont have anything for him.  I reviewed the chart and it looks like he is to begin Naprosyn 500 po bid pc.  Could you send prescription to [facility]?

## 2021-06-21 MED ORDER — NAPROXEN 500 MG PO TABS: 500.0000 mg | ORAL_TABLET | Freq: Two times a day (BID) | ORAL | 5 refills | Status: DC

## 2021-06-25 ENCOUNTER — Telehealth: Payer: Self-pay | Admitting: Orthopaedic Surgery

## 2021-06-25 NOTE — Telephone Encounter (Signed)
Patient's MRI has been scheduled for 07/06/21.    He requests medication to take before he has the MRI for anxiety/possible claustrophobia.  He states he uses [facility].

## 2021-06-28 MED ORDER — DIAZEPAM 10 MG PO TABS: 10.0000 mg | ORAL_TABLET | Freq: Once | ORAL | 0 refills | Status: AC

## 2021-07-06 ENCOUNTER — Other Ambulatory Visit: Payer: Self-pay

## 2021-07-06 ENCOUNTER — Ambulatory Visit (HOSPITAL_COMMUNITY)
Admission: RE | Admit: 2021-07-06 | Discharge: 2021-07-06 | Disposition: A | Discharge status: Home / Self Care | Payer: Medicare HMO | Source: Ambulatory Visit | Attending: Orthopaedic Surgery | Admitting: Orthopaedic Surgery

## 2021-07-06 ENCOUNTER — Ambulatory Visit: Payer: Medicare HMO | Admitting: Orthopaedic Surgery

## 2021-07-06 DIAGNOSIS — M79605 Pain in left leg: Secondary | ICD-10-CM | POA: Diagnosis not present

## 2021-07-06 DIAGNOSIS — M545 Low back pain, unspecified: Secondary | ICD-10-CM | POA: Diagnosis not present

## 2021-07-06 DIAGNOSIS — M47816 Spondylosis without myelopathy or radiculopathy, lumbar region: Secondary | ICD-10-CM | POA: Diagnosis not present

## 2021-07-10 ENCOUNTER — Encounter: Payer: Self-pay | Admitting: Family Medicine

## 2021-07-14 ENCOUNTER — Ambulatory Visit (INDEPENDENT_AMBULATORY_CARE_PROVIDER_SITE_OTHER): Payer: Medicare HMO

## 2021-07-14 ENCOUNTER — Other Ambulatory Visit: Payer: Self-pay

## 2021-07-14 DIAGNOSIS — Z23 Encounter for immunization: Secondary | ICD-10-CM

## 2021-07-15 ENCOUNTER — Ambulatory Visit: Payer: Medicare HMO | Admitting: Orthopaedic Surgery

## 2021-07-15 ENCOUNTER — Encounter: Payer: Self-pay | Admitting: Orthopaedic Surgery

## 2021-07-15 VITALS — BP 120/72 | HR 76 | Ht 71.0 in | Wt 189.0 lb

## 2021-07-15 DIAGNOSIS — M79605 Pain in left leg: Secondary | ICD-10-CM | POA: Diagnosis not present

## 2021-07-15 DIAGNOSIS — M545 Low back pain, unspecified: Secondary | ICD-10-CM

## 2021-07-15 NOTE — Progress Notes (Signed)
My back is not as tender.  He has done well with the anti-inflammatory.  He has less lower back pain.  He had the MRI which showed: IMPRESSION: 1. Multilevel lumbar spondylosis, as described above. No significant canal stenosis at any level. 2. Moderate-severe bilateral foraminal stenosis at L3-L4 through L5-S1. 3. Fusiform infrarenal abdominal aortic aneurysm measuring 4.2 cm in AP diameter. Recommend follow-up every 12 months and vascular consultation. This recommendation follows ACR consensus guidelines: White Paper of the ACR Incidental Findings Committee II on Vascular Findings. J Am Coll Radiol 2013; 10:789-794. 4. Bilateral common iliac artery aneurysms, each measuring approximately 2.5 cm in diameter.  I have explained the findings to him.  I have independently reviewed the MRI.    He has already seen his vascular surgeon this past week and was told he will be seen next year.  I have also explained the need to have follow-up.  He has less back pain, no numbness.  He is active and is doing his exercises.  Spine/Pelvis examination:  Inspection:  Overall, sacoiliac joint benign and hips nontender; without crepitus or defects.   Thoracic spine inspection: Alignment normal without kyphosis present   Lumbar spine inspection:  Alignment  with normal lumbar lordosis, without scoliosis apparent.   Thoracic spine palpation:  without tenderness of spinal processes   Lumbar spine palpation: without tenderness of lumbar area; without tightness of lumbar muscles    Range of Motion:   Lumbar flexion, forward flexion is normal without pain or tenderness    Lumbar extension is full without pain or tenderness   Left lateral bend is normal without pain or tenderness   Right lateral bend is normal without pain or tenderness   Straight leg raising is normal  Strength & tone: normal   Stability overall normal stability  Encounter Diagnosis  Name Primary?   Lumbar pain with radiation  down left leg Yes   I will see him in six weeks.  Continue medicine.  Call if any problem.  Precautions discussed.  Electronically Signed Sanjuana Kava, MD 10/6/20228:42 AM

## 2021-08-11 ENCOUNTER — Other Ambulatory Visit: Payer: Self-pay | Admitting: Family Medicine

## 2021-08-11 DIAGNOSIS — E785 Hyperlipidemia, unspecified: Secondary | ICD-10-CM

## 2021-08-20 ENCOUNTER — Telehealth: Payer: Self-pay

## 2021-08-23 MED ORDER — NAPROXEN 500 MG PO TABS: 500.0000 mg | ORAL_TABLET | Freq: Two times a day (BID) | ORAL | 5 refills | Status: DC

## 2021-08-26 ENCOUNTER — Ambulatory Visit: Payer: Medicare HMO | Admitting: Orthopaedic Surgery

## 2021-09-07 ENCOUNTER — Ambulatory Visit: Payer: Medicare HMO | Admitting: Orthopaedic Surgery

## 2021-09-09 ENCOUNTER — Encounter: Payer: Self-pay | Admitting: Orthopaedic Surgery

## 2021-09-09 ENCOUNTER — Other Ambulatory Visit: Payer: Self-pay

## 2021-09-09 ENCOUNTER — Ambulatory Visit: Payer: Medicare HMO | Admitting: Orthopaedic Surgery

## 2021-09-09 VITALS — BP 175/102 | HR 76 | Ht 71.0 in | Wt 202.8 lb

## 2021-09-09 DIAGNOSIS — M545 Low back pain, unspecified: Secondary | ICD-10-CM | POA: Diagnosis not present

## 2021-09-09 DIAGNOSIS — M79605 Pain in left leg: Secondary | ICD-10-CM | POA: Diagnosis not present

## 2021-09-09 MED ORDER — HYDROCODONE-ACETAMINOPHEN 5-325 MG PO TABS: 1.0000 | ORAL_TABLET | ORAL | 0 refills | Status: DC | PRN

## 2021-09-09 NOTE — Progress Notes (Signed)
My back is tender  He has had more lower back pain with the cooler weather.  He is doing his exercises.  He has no weakness, no new trauma.  He will see his family doctor about his aorta aneurysm.    Spine/Pelvis examination:  Inspection:  Overall, sacoiliac joint benign and hips nontender; without crepitus or defects.   Thoracic spine inspection: Alignment normal without kyphosis present   Lumbar spine inspection:  Alignment  with normal lumbar lordosis, without scoliosis apparent.   Thoracic spine palpation:  without tenderness of spinal processes   Lumbar spine palpation: without tenderness of lumbar area; without tightness of lumbar muscles    Range of Motion:   Lumbar flexion, forward flexion is normal without pain or tenderness    Lumbar extension is full without pain or tenderness   Left lateral bend is normal without pain or tenderness   Right lateral bend is normal without pain or tenderness   Straight leg raising is normal  Strength & tone: normal   Stability overall normal stability  Encounter Diagnosis  Name Primary?   Lumbar pain with radiation down left leg Yes   I have reviewed the [place] web site prior to prescribing narcotic medicine for this patient.  Return in two months.  Call if any problem.  Precautions discussed.  Electronically [place], MD 12/1/20229:25 AM

## 2021-09-14 ENCOUNTER — Ambulatory Visit (INDEPENDENT_AMBULATORY_CARE_PROVIDER_SITE_OTHER): Payer: Medicare HMO | Admitting: Family Medicine

## 2021-09-14 ENCOUNTER — Encounter: Payer: Self-pay | Admitting: Family Medicine

## 2021-09-14 ENCOUNTER — Other Ambulatory Visit: Payer: Self-pay

## 2021-09-14 ENCOUNTER — Ambulatory Visit (HOSPITAL_COMMUNITY)
Admission: RE | Admit: 2021-09-14 | Discharge: 2021-09-14 | Disposition: A | Discharge status: Home / Self Care | Payer: Medicare HMO | Source: Ambulatory Visit | Attending: Family Medicine | Admitting: Family Medicine

## 2021-09-14 VITALS — BP 152/78 | HR 60 | Temp 97.6°F | Resp 18 | Ht 71.0 in | Wt 202.0 lb

## 2021-09-14 DIAGNOSIS — M79661 Pain in right lower leg: Secondary | ICD-10-CM | POA: Diagnosis not present

## 2021-09-14 DIAGNOSIS — M7989 Other specified soft tissue disorders: Secondary | ICD-10-CM

## 2021-09-14 DIAGNOSIS — R6889 Other general symptoms and signs: Secondary | ICD-10-CM | POA: Diagnosis not present

## 2021-09-14 DIAGNOSIS — R600 Localized edema: Secondary | ICD-10-CM | POA: Diagnosis not present

## 2021-09-14 NOTE — Patient Instructions (Signed)
Forestine Na at 10:30 today for [place]

## 2021-09-14 NOTE — Progress Notes (Signed)
Subjective:  Patient ID: Tommy [name], male    DOB: 1951-02-19, 70 y.o.   MRN: [id number]  Patient Care Team: [name], Fransisca Kaufmann, MD as PCP - General (Family Medicine) Eloise Harman, DO as Consulting Physician (Internal Medicine) Irine Seal, MD as Attending Physician (Urology)   Chief Complaint:  bilateral leg swelling   HPI: Tommy [name] is a 70 y.o. male presenting on 09/14/2021 for bilateral leg swelling   Pt presents today with complaints of BLE swelling, right significantly worse than left. Denies recent injury. States swelling started a few days ago and is getting worse. No recent long travel or procedures. Denies shortness of breath, palpitations, orthostasis, syncope, chest pain, fever, chills, or weakness. No     Relevant past medical, surgical, family, and social history reviewed and updated as indicated.  Allergies and medications reviewed and updated. Data reviewed: Chart in Epic.   Past Medical History:  Diagnosis Date   BPH with obstruction/lower urinary tract symptoms    hyperplasia   ED (erectile dysfunction)    arterial insuff.   History of DVT of lower extremity    History of gastric ulcer    Hyperlipidemia    Hypertension    Peripheral vascular disease ([place])    Prostate cancer ([place]) DX 01/01/15--  urologist-  dr Jeffie Pollock   Stage T1c, Gleason 3+4, PSA 5.73, vol 33ml   Wears glasses     Past Surgical History:  Procedure Laterality Date   FEMORAL ARTERY STENT Left 07-22-2008  &  11-16-2010   left SFA//   In-Stent restenosis   ORIF LEFT INDEX FINGER FX  09-28-2006   PROSTATE BIOPSY  01/01/15   PROSTATE SURGERY  July 2016   Seed Implant   RADIOACTIVE SEED IMPLANT N/A 04/23/2015   Procedure: RADIOACTIVE SEED IMPLANT/BRACHYTHERAPY IMPLANT;  Surgeon: Irine Seal, MD;  Location: [facility];  Service: Urology;  Laterality: N/A;   TONSILLECTOMY  as child    Social History   Socioeconomic History   Marital status: Married    Spouse name:  Silva Bandy   Number of children: 3   Years of education: Not on file   Highest education level: Not on file  Occupational History    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  Tobacco Use   Smoking status: Former    Packs/day: 1.00    Years: 15.00    Pack years: 15.00    Types: Cigarettes    Start date: 09/09/2010    Quit date: 04/21/2011    Years since quitting: 10.4   Smokeless tobacco: Current    Types: Snuff   Tobacco comments:    occasional  someday dips tobacco  Vaping Use   Vaping Use: Never used  Substance and Sexual Activity   Alcohol use: No    Alcohol/week: 0.0 standard drinks   Drug use: No   Sexual activity: Not on file  Other Topics Concern   Not on file  Social History Narrative   2 daughters and one son - all live nearby   Social Determinants of Health   Financial Resource Strain: Low Risk    Difficulty of Paying Living Expenses: Not hard at all  Food Insecurity: No Food Insecurity   Worried About Charity fundraiser in the Last Year: Never true   Arboriculturist in the Last Year: Never true  Transportation Needs: No Transportation Needs   Lack of Transportation (Medical): No   Lack  of Transportation (Non-Medical): No  Physical Activity: Sufficiently Active   Days of Exercise per Week: 7 days   Minutes of Exercise per Session: 40 min  Stress: No Stress Concern Present   Feeling of Stress : Not at all  Social Connections: Socially Integrated   Frequency of Communication with Friends and Family: More than three times a week   Frequency of Social Gatherings with Friends and Family: More than three times a week   Attends Religious Services: 1 to 4 times per year   Active Member of [employer] or Organizations: Yes   Attends Archivist Meetings: 1 to 4 times per year   Marital Status: Married  Human resources officer Violence: Not At Risk   Fear of Current or Ex-Partner: No   Emotionally Abused: No   Physically Abused: No   Sexually  Abused: No    Outpatient Encounter Medications as of 09/14/2021  Medication Sig   acetaminophen (TYLENOL) 500 MG tablet Take 1 tablet (500 mg total) by mouth every 6 (six) hours as needed.   amLODipine (NORVASC) 10 MG tablet Take 1 tablet (10 mg total) by mouth daily.   clopidogrel (PLAVIX) 75 MG tablet Take 1 tablet (75 mg total) by mouth daily.   esomeprazole (NEXIUM) 20 MG capsule Take 1 capsule (20 mg total) by mouth daily before breakfast.   fenofibrate (TRICOR) 145 MG tablet Take 1 tablet (145 mg total) by mouth daily.   hydrochlorothiazide (HYDRODIURIL) 25 MG tablet Take 1 tablet (25 mg total) by mouth daily.   HYDROcodone-acetaminophen (NORCO/VICODIN) 5-325 MG tablet Take 1 tablet by mouth every 4 (four) hours as needed for up to 5 days for moderate pain.   naproxen (NAPROSYN) 500 MG tablet Take 1 tablet (500 mg total) by mouth 2 (two) times daily with a meal.   rosuvastatin (CRESTOR) 10 MG tablet TAKE 1 TABLET EVERY EVENING   sodium chloride (OCEAN) 0.65 % SOLN nasal spray Place 1 spray into both nostrils as needed for congestion.   tamsulosin (FLOMAX) 0.4 MG CAPS capsule Take one capsule daily after supper   tizanidine (ZANAFLEX) 2 MG capsule Take 1 capsule (2 mg total) by mouth 3 (three) times daily.   No facility-administered encounter medications on file as of 09/14/2021.    Allergies  Allergen Reactions   Penicillins Swelling    Has patient had a PCN reaction causing immediate rash, facial/tongue/throat swelling, SOB or lightheadedness with hypotension: No Has patient had a PCN reaction causing severe rash involving mucus membranes or skin necrosis: No Has patient had a PCN reaction that required hospitalization No Has patient had a PCN reaction occurring within the last 10 years: No If all of the above answers are "NO", then may proceed with Cephalosporin use.    Review of Systems  Constitutional:  Positive for fatigue. Negative for activity change, appetite change,  chills, diaphoresis, fever and unexpected weight change.  HENT: Negative.    Eyes: Negative.   Respiratory:  Negative for cough, chest tightness and shortness of breath.   Cardiovascular:  Positive for leg swelling. Negative for chest pain and palpitations.  Gastrointestinal:  Negative for abdominal pain, blood in stool, constipation, diarrhea, nausea and vomiting.  Endocrine: Negative.   Genitourinary:  Negative for decreased urine volume, difficulty urinating, dysuria, frequency and urgency.  Musculoskeletal:  Negative for arthralgias and myalgias.  Skin: Negative.   Allergic/Immunologic: Negative.   Neurological:  Negative for dizziness, tremors, seizures, syncope, facial asymmetry, speech difficulty, weakness, light-headedness, numbness and headaches.  Hematological:  Negative.   Psychiatric/Behavioral:  Negative for confusion, hallucinations, sleep disturbance and suicidal ideas.   All other systems reviewed and are negative.      Objective:  BP (!) 152/78   Pulse 60   Temp 97.6 F (36.4 C)   Resp 18   Ht 5\' 11"  (1.803 m)   Wt 202 lb (91.6 kg)   SpO2 98%   BMI 28.17 kg/m    Wt Readings from Last 3 Encounters:  09/14/21 202 lb (91.6 kg)  09/09/21 202 lb 12.8 oz (92 kg)  07/15/21 189 lb (85.7 kg)    Physical Exam Vitals and nursing note reviewed.  Constitutional:      General: He is not in acute distress.    Appearance: Normal appearance. He is obese. He is not ill-appearing, toxic-appearing or diaphoretic.  HENT:     Head: Normocephalic and atraumatic.     Mouth/Throat:     Mouth: Mucous membranes are moist.  Eyes:     Pupils: Pupils are equal, round, and reactive to light.  Cardiovascular:     Rate and Rhythm: Normal rate and regular rhythm.     Heart sounds: Normal heart sounds. No murmur heard.   No friction rub. No gallop.     Comments: Right calf 38 with positive Homan's Sign Left calf 36 Pulmonary:     Effort: Pulmonary effort is normal. No respiratory  distress.     Breath sounds: Normal breath sounds.  Musculoskeletal:     Right lower leg: 2+ Edema present.     Left lower leg: 1+ Edema present.  Skin:    General: Skin is warm and dry.     Capillary Refill: Capillary refill takes less than 2 seconds.  Neurological:     General: No focal deficit present.     Mental Status: He is alert and oriented to person, place, and time.  Psychiatric:        Mood and Affect: Mood normal.        Behavior: Behavior normal.        Thought Content: Thought content normal.        Judgment: Judgment normal.    Results for orders placed or performed in visit on 06/02/21  Microscopic Examination   Urine  Result Value Ref Range   WBC, UA None seen 0 - 5 /hpf   RBC 0-2 0 - 2 /hpf   Epithelial Cells (non renal) 0-10 0 - 10 /hpf   Renal Epithel, UA None seen None seen /hpf   Mucus, UA Present (A) Not Estab.   Bacteria, UA None seen None seen/Few  Urinalysis, Complete  Result Value Ref Range   Specific Gravity, UA >1.030 (H) 1.005 - 1.030   pH, UA 5.0 5.0 - 7.5   Color, UA Yellow Yellow   Appearance Ur Clear Clear   Leukocytes,UA Negative Negative   Protein,UA Negative Negative/Trace   Glucose, UA Negative Negative   Ketones, UA Trace (A) Negative   RBC, UA Negative Negative   Bilirubin, UA Negative Negative   Urobilinogen, Ur 4.0 (H) 0.2 - 1.0 mg/dL   Nitrite, UA Negative Negative   Microscopic Examination See below:        Pertinent labs & imaging results that were available during my care of the patient were reviewed by me and considered in my medical decision making.  Assessment & Plan:  Tommy [name] was seen today for bilateral leg swelling.  Diagnoses and all orders for this visit:  Pain and swelling  of right lower leg Concerning for RLE DVT, STAT US DVT study ordered. Further treatment pending results. Will obtain BNP, CBC, and BMP.  -     US Venous Img Lower Unilateral Right; Future     Continue all other maintenance  medications.  Follow up plan: Return if symptoms worsen or fail to improve.   Continue healthy lifestyle choices, including diet (rich in fruits, vegetables, and lean proteins, and low in salt and simple carbohydrates) and exercise (at least 30 minutes of moderate physical activity daily).   The above assessment and management plan was discussed with the patient. The patient verbalized understanding of and has agreed to the management plan. Patient is aware to call the clinic if they develop any new symptoms or if symptoms persist or worsen. Patient is aware when to return to the clinic for a follow-up visit. Patient educated on when it is appropriate to go to the emergency department.   Monia Pouch, FNP-C [facility] [phone]

## 2021-09-15 LAB — BMP8+EGFR
BUN/Creatinine Ratio: 14 (ref 10–24)
BUN: 16 mg/dL (ref 8–27)
CO2: 26 mmol/L (ref 20–29)
Calcium: 10.6 mg/dL — ABNORMAL HIGH (ref 8.6–10.2)
Chloride: 105 mmol/L (ref 96–106)
Creatinine, Ser: 1.14 mg/dL (ref 0.76–1.27)
Glucose: 123 mg/dL — ABNORMAL HIGH (ref 70–99)
Potassium: 4.9 mmol/L (ref 3.5–5.2)
Sodium: 144 mmol/L (ref 134–144)
eGFR: 68 mL/min/{1.73_m2} (ref >59)

## 2021-09-15 LAB — CBC WITH DIFFERENTIAL/PLATELET
Basophils Absolute: 0 10*3/uL (ref 0.0–0.2)
Basos: 1 %
EOS (ABSOLUTE): 0.2 10*3/uL (ref 0.0–0.4)
Eos: 6 %
Hematocrit: 41 % (ref 37.5–51.0)
Hemoglobin: 14.1 g/dL (ref 13.0–17.7)
Immature Grans (Abs): 0 10*3/uL (ref 0.0–0.1)
Immature Granulocytes: 1 %
Lymphocytes Absolute: 1.2 10*3/uL (ref 0.7–3.1)
Lymphs: 34 %
MCH: 29.6 pg (ref 26.6–33.0)
MCHC: 34.4 g/dL (ref 31.5–35.7)
MCV: 86 fL (ref 79–97)
Monocytes Absolute: 0.3 10*3/uL (ref 0.1–0.9)
Monocytes: 9 %
Neutrophils Absolute: 1.8 10*3/uL (ref 1.4–7.0)
Neutrophils: 49 %
Platelets: 176 10*3/uL (ref 150–450)
RBC: 4.77 x10E6/uL (ref 4.14–5.80)
RDW: 12 % (ref 11.6–15.4)
WBC: 3.6 10*3/uL (ref 3.4–10.8)

## 2021-09-15 LAB — BRAIN NATRIURETIC PEPTIDE: BNP: 15 pg/mL (ref 0.0–100.0)

## 2021-09-19 ENCOUNTER — Other Ambulatory Visit: Payer: Self-pay | Admitting: Family Medicine

## 2021-09-19 DIAGNOSIS — N400 Enlarged prostate without lower urinary tract symptoms: Secondary | ICD-10-CM

## 2021-09-19 DIAGNOSIS — I10 Essential (primary) hypertension: Secondary | ICD-10-CM

## 2021-10-01 ENCOUNTER — Other Ambulatory Visit: Payer: Self-pay | Admitting: Orthopaedic Surgery

## 2021-10-01 NOTE — Telephone Encounter (Signed)
Please advise in Dr. Luna Glasgow absence. Office manager advised to route to you since you are in office today.

## 2021-10-29 ENCOUNTER — Ambulatory Visit: Payer: Medicare HMO | Admitting: Family Medicine

## 2021-11-08 ENCOUNTER — Telehealth: Payer: Self-pay | Admitting: Orthopedic Surgery

## 2021-11-08 MED ORDER — HYDROCODONE-ACETAMINOPHEN 5-325 MG PO TABS: ORAL_TABLET | ORAL | 0 refills | Status: DC

## 2021-11-11 ENCOUNTER — Ambulatory Visit: Payer: Medicare HMO | Admitting: Orthopaedic Surgery

## 2021-11-18 ENCOUNTER — Ambulatory Visit: Payer: Medicare HMO | Admitting: Orthopaedic Surgery

## 2021-11-18 ENCOUNTER — Ambulatory Visit: Payer: Medicare HMO

## 2021-11-18 ENCOUNTER — Other Ambulatory Visit: Payer: Self-pay

## 2021-11-18 VITALS — BP 152/93 | HR 60 | Ht 71.0 in | Wt 196.0 lb

## 2021-11-18 DIAGNOSIS — M542 Cervicalgia: Secondary | ICD-10-CM | POA: Diagnosis not present

## 2021-11-18 MED ORDER — DIAZEPAM 10 MG PO TABS: 10.0000 mg | ORAL_TABLET | Freq: Once | ORAL | 0 refills | Status: AC

## 2021-11-18 NOTE — Patient Instructions (Signed)
While we are working on your approval please go ahead and call to schedule your appointment with [facility] in at least one (1) week.  ° °Central Scheduling °[phone] °

## 2021-11-18 NOTE — Progress Notes (Signed)
My arm is tingling.  He has developed pain in the left shoulder that radiates to the hand on the left.  He has no trauma.  He has full motion of the shoulder but has neck pain.    ROM of neck is limited in all directions.  ROM of shoulder is full.  He has slight weakness of left triceps.  Grip is slightly decreased on left but he has had amputations of two fingers on the right which makes that grip less.    Encounter Diagnosis  Name Primary?   Neck pain Yes   X-rays were done of the cervical spine, reported separately.  I will get MRI.  Return in two weeks.  He has pain medicine given to him recently by me.  Call if any problem.  Precautions discussed.  Electronically Signed Sanjuana Kava, MD 2/9/20239:08 AM

## 2021-11-18 NOTE — Addendum Note (Signed)
Addended by: Elizabeth Sauer on: 11/18/2021 09:59 AM   Modules accepted: Orders

## 2021-11-22 ENCOUNTER — Encounter: Payer: Self-pay | Admitting: Family Medicine

## 2021-11-22 ENCOUNTER — Ambulatory Visit (INDEPENDENT_AMBULATORY_CARE_PROVIDER_SITE_OTHER): Payer: Medicare HMO | Admitting: Family Medicine

## 2021-11-22 ENCOUNTER — Telehealth: Payer: Self-pay | Admitting: Family Medicine

## 2021-11-22 VITALS — BP 152/89 | HR 66 | Ht 71.0 in | Wt 196.0 lb

## 2021-11-22 DIAGNOSIS — I10 Essential (primary) hypertension: Secondary | ICD-10-CM

## 2021-11-22 DIAGNOSIS — Z23 Encounter for immunization: Secondary | ICD-10-CM

## 2021-11-22 DIAGNOSIS — K219 Gastro-esophageal reflux disease without esophagitis: Secondary | ICD-10-CM | POA: Diagnosis not present

## 2021-11-22 DIAGNOSIS — I739 Peripheral vascular disease, unspecified: Secondary | ICD-10-CM

## 2021-11-22 DIAGNOSIS — I7143 Infrarenal abdominal aortic aneurysm, without rupture: Secondary | ICD-10-CM

## 2021-11-22 DIAGNOSIS — E785 Hyperlipidemia, unspecified: Secondary | ICD-10-CM

## 2021-11-22 DIAGNOSIS — N400 Enlarged prostate without lower urinary tract symptoms: Secondary | ICD-10-CM

## 2021-11-22 DIAGNOSIS — R7303 Prediabetes: Secondary | ICD-10-CM | POA: Diagnosis not present

## 2021-11-22 LAB — LIPID PANEL
Chol/HDL Ratio: 3.5 ratio (ref 0.0–5.0)
Cholesterol, Total: 121 mg/dL (ref 100–199)
HDL: 35 mg/dL — ABNORMAL LOW (ref >39)
LDL Chol Calc (NIH): 69 mg/dL (ref 0–99)
Triglycerides: 90 mg/dL (ref 0–149)
VLDL Cholesterol Cal: 17 mg/dL (ref 5–40)

## 2021-11-22 LAB — CBC WITH DIFFERENTIAL/PLATELET
Basophils Absolute: 0 10*3/uL (ref 0.0–0.2)
Basos: 1 %
EOS (ABSOLUTE): 0.1 10*3/uL (ref 0.0–0.4)
Eos: 3 %
Hematocrit: 42.3 % (ref 37.5–51.0)
Hemoglobin: 14.6 g/dL (ref 13.0–17.7)
Immature Grans (Abs): 0 10*3/uL (ref 0.0–0.1)
Immature Granulocytes: 1 %
Lymphocytes Absolute: 1.4 10*3/uL (ref 0.7–3.1)
Lymphs: 34 %
MCH: 29.4 pg (ref 26.6–33.0)
MCHC: 34.5 g/dL (ref 31.5–35.7)
MCV: 85 fL (ref 79–97)
Monocytes Absolute: 0.3 10*3/uL (ref 0.1–0.9)
Monocytes: 8 %
Neutrophils Absolute: 2.2 10*3/uL (ref 1.4–7.0)
Neutrophils: 53 %
Platelets: 206 10*3/uL (ref 150–450)
RBC: 4.96 x10E6/uL (ref 4.14–5.80)
RDW: 12.4 % (ref 11.6–15.4)
WBC: 4.1 10*3/uL (ref 3.4–10.8)

## 2021-11-22 LAB — CMP14+EGFR
ALT: 31 IU/L (ref 0–44)
AST: 24 IU/L (ref 0–40)
Albumin/Globulin Ratio: 2.2 (ref 1.2–2.2)
Albumin: 4.6 g/dL (ref 3.7–4.7)
Alkaline Phosphatase: 38 IU/L — ABNORMAL LOW (ref 44–121)
BUN/Creatinine Ratio: 20 (ref 10–24)
BUN: 21 mg/dL (ref 8–27)
Bilirubin Total: 0.7 mg/dL (ref 0.0–1.2)
CO2: 27 mmol/L (ref 20–29)
Calcium: 10.7 mg/dL — ABNORMAL HIGH (ref 8.6–10.2)
Chloride: 104 mmol/L (ref 96–106)
Creatinine, Ser: 1.04 mg/dL (ref 0.76–1.27)
Globulin, Total: 2.1 g/dL (ref 1.5–4.5)
Glucose: 127 mg/dL — ABNORMAL HIGH (ref 70–99)
Potassium: 4.8 mmol/L (ref 3.5–5.2)
Sodium: 144 mmol/L (ref 134–144)
Total Protein: 6.7 g/dL (ref 6.0–8.5)
eGFR: 76 mL/min/{1.73_m2} (ref >59)

## 2021-11-22 LAB — BAYER DCA HB A1C WAIVED: HB A1C (BAYER DCA - WAIVED): 6.6 % — ABNORMAL HIGH (ref 4.8–5.6)

## 2021-11-22 MED ORDER — HYDROCHLOROTHIAZIDE 25 MG PO TABS: 25.0000 mg | ORAL_TABLET | Freq: Every day | ORAL | 3 refills | Status: DC

## 2021-11-22 MED ORDER — ROSUVASTATIN CALCIUM 10 MG PO TABS: 10.0000 mg | ORAL_TABLET | Freq: Every evening | ORAL | 3 refills | Status: DC

## 2021-11-22 MED ORDER — TAMSULOSIN HCL 0.4 MG PO CAPS: 0.4000 mg | ORAL_CAPSULE | Freq: Every day | ORAL | 3 refills | Status: DC

## 2021-11-22 MED ORDER — AMLODIPINE BESYLATE 10 MG PO TABS: 10.0000 mg | ORAL_TABLET | Freq: Every day | ORAL | 3 refills | Status: DC

## 2021-11-22 MED ORDER — CLOPIDOGREL BISULFATE 75 MG PO TABS: 75.0000 mg | ORAL_TABLET | Freq: Every day | ORAL | 3 refills | Status: DC

## 2021-11-22 MED ORDER — FENOFIBRATE 145 MG PO TABS: 145.0000 mg | ORAL_TABLET | Freq: Every day | ORAL | 3 refills | Status: DC

## 2021-11-22 MED ORDER — ESOMEPRAZOLE MAGNESIUM 20 MG PO CPDR: 20.0000 mg | DELAYED_RELEASE_CAPSULE | Freq: Every day | ORAL | 3 refills | Status: DC

## 2021-11-22 NOTE — Telephone Encounter (Signed)
Pt returned missed call. Made pt aware that all Rxs were sent to [facility]. Pt voiced understanding.

## 2021-11-22 NOTE — Telephone Encounter (Signed)
All rx's from today resent to [facility] but TTC pt and let him know but just kept ringing and no VM.

## 2021-11-22 NOTE — Progress Notes (Signed)
BP (!) 152/89    Pulse 66    Ht 5\' 11"  (1.803 m)    Wt 196 lb (88.9 kg)    SpO2 96%    BMI 27.34 kg/m    Subjective:   Patient ID: Tommy [name], male    DOB: [birth year], 71 y.o.   MRN: [id number]  HPI: Tommy [name] is a 71 y.o. male presenting on 11/22/2021 for Medical Management of Chronic Issues and Hypertension   HPI Hypertension Patient is currently on amlodipine and hydrochlorothiazide, he admits he did not take them yet this morning, and their blood pressure today is 152/89. Patient denies any lightheadedness or dizziness. Patient denies headaches, blurred vision, chest pains, shortness of breath, or weakness. Denies any side effects from medication and is content with current medication.   Hyperlipidemia Patient is coming in for recheck of his hyperlipidemia. The patient is currently taking fenofibrate and Crestor. They deny any issues with myalgias or history of liver damage from it. They deny any focal numbness or weakness or chest pain.   PAD and AAA Patient has known PAD and PVD and AAA.  We will refer back to vascular.  He has not followed up with them in a couple of years.  Relevant past medical, surgical, family and social history reviewed and updated as indicated. Interim medical history since our last visit reviewed. Allergies and medications reviewed and updated.  Review of Systems  Constitutional:  Negative for chills and fever.  Eyes:  Negative for visual disturbance.  Respiratory:  Negative for shortness of breath and wheezing.   Cardiovascular:  Negative for chest pain and leg swelling.  Musculoskeletal:  Negative for back pain and gait problem.  Skin:  Negative for rash.  Neurological:  Negative for dizziness, weakness and light-headedness.  All other systems reviewed and are negative.  Per HPI unless specifically indicated above   Allergies as of 11/22/2021       Reactions   Penicillins Swelling   Has patient had a PCN reaction causing immediate rash,  facial/tongue/throat swelling, SOB or lightheadedness with hypotension: No Has patient had a PCN reaction causing severe rash involving mucus membranes or skin necrosis: No Has patient had a PCN reaction that required hospitalization No Has patient had a PCN reaction occurring within the last 10 years: No If all of the above answers are "NO", then may proceed with Cephalosporin use.        Medication List        Accurate as of November 22, 2021  9:09 AM. If you have any questions, ask your nurse or doctor.          acetaminophen 500 MG tablet Commonly known as: TYLENOL Take 1 tablet (500 mg total) by mouth every 6 (six) hours as needed.   amLODipine 10 MG tablet Commonly known as: NORVASC Take 1 tablet (10 mg total) by mouth daily.   clopidogrel 75 MG tablet Commonly known as: PLAVIX Take 1 tablet (75 mg total) by mouth daily.   esomeprazole 20 MG capsule Commonly known as: NexIUM Take 1 capsule (20 mg total) by mouth daily before breakfast.   fenofibrate 145 MG tablet Commonly known as: TRICOR Take 1 tablet (145 mg total) by mouth daily.   hydrochlorothiazide 25 MG tablet Commonly known as: HYDRODIURIL Take 1 tablet (25 mg total) by mouth daily.   HYDROcodone-acetaminophen 5-325 MG tablet Commonly known as: NORCO/VICODIN TAKE ONE TABLET EVERY 4 HOURS AS NEEDED FOR MODERATE PAIN   HYDROcodone-acetaminophen 5-325  MG tablet Commonly known as: NORCO/VICODIN One tablet every six hours for pain.  Limit 7 days.   naproxen 500 MG tablet Commonly known as: NAPROSYN Take 1 tablet (500 mg total) by mouth 2 (two) times daily with a meal.   rosuvastatin 10 MG tablet Commonly known as: CRESTOR Take 1 tablet (10 mg total) by mouth every evening.   sodium chloride 0.65 % Soln nasal spray Commonly known as: OCEAN Place 1 spray into both nostrils as needed for congestion.   tamsulosin 0.4 MG Caps capsule Commonly known as: FLOMAX Take 1 capsule (0.4 mg total) by mouth  daily after supper. What changed:  how much to take how to take this when to take this additional instructions Changed by: Fransisca Kaufmann Addysyn Fern, MD   tizanidine 2 MG capsule Commonly known as: ZANAFLEX Take 1 capsule (2 mg total) by mouth 3 (three) times daily.         Objective:   BP (!) 152/89    Pulse 66    Ht $R'5\' 11"'Ba$  (1.803 m)    Wt 196 lb (88.9 kg)    SpO2 96%    BMI 27.34 kg/m   Wt Readings from Last 3 Encounters:  11/22/21 196 lb (88.9 kg)  11/18/21 196 lb (88.9 kg)  09/14/21 202 lb (91.6 kg)    Physical Exam Vitals and nursing note reviewed.  Constitutional:      General: He is not in acute distress.    Appearance: He is well-developed. He is not diaphoretic.  Eyes:     General: No scleral icterus.    Conjunctiva/sclera: Conjunctivae normal.  Neck:     Thyroid: No thyromegaly.  Cardiovascular:     Rate and Rhythm: Normal rate and regular rhythm.     Heart sounds: Normal heart sounds. No murmur heard. Pulmonary:     Effort: Pulmonary effort is normal. No respiratory distress.     Breath sounds: Normal breath sounds. No wheezing.  Musculoskeletal:        General: No swelling. Normal range of motion.     Cervical back: Neck supple.  Lymphadenopathy:     Cervical: No cervical adenopathy.  Skin:    General: Skin is warm and dry.     Findings: No rash.  Neurological:     Mental Status: He is alert and oriented to person, place, and time.     Coordination: Coordination normal.  Psychiatric:        Behavior: Behavior normal.      Assessment & Plan:   Problem List Items Addressed This Visit       Cardiovascular and Mediastinum   Essential hypertension - Primary   Relevant Medications   amLODipine (NORVASC) 10 MG tablet   fenofibrate (TRICOR) 145 MG tablet   hydrochlorothiazide (HYDRODIURIL) 25 MG tablet   rosuvastatin (CRESTOR) 10 MG tablet   Other Relevant Orders   CMP14+EGFR   Lipid panel   PAD (peripheral artery disease) (HCC)   Relevant  Medications   clopidogrel (PLAVIX) 75 MG tablet   amLODipine (NORVASC) 10 MG tablet   fenofibrate (TRICOR) 145 MG tablet   hydrochlorothiazide (HYDRODIURIL) 25 MG tablet   rosuvastatin (CRESTOR) 10 MG tablet   Other Relevant Orders   CBC with Differential/Platelet   Ambulatory referral to Vascular Surgery     Digestive   GERD without esophagitis   Relevant Medications   esomeprazole (NEXIUM) 20 MG capsule   Other Relevant Orders   CBC with Differential/Platelet     Other  Hyperlipidemia LDL goal <130   Relevant Medications   amLODipine (NORVASC) 10 MG tablet   fenofibrate (TRICOR) 145 MG tablet   hydrochlorothiazide (HYDRODIURIL) 25 MG tablet   rosuvastatin (CRESTOR) 10 MG tablet   Other Relevant Orders   CMP14+EGFR   Lipid panel   Prediabetes   Relevant Orders   Bayer DCA Hb A1c Waived   Other Visit Diagnoses     Enlarged prostate       Relevant Medications   tamsulosin (FLOMAX) 0.4 MG CAPS capsule   Infrarenal abdominal aortic aneurysm (AAA) without rupture       Relevant Medications   amLODipine (NORVASC) 10 MG tablet   fenofibrate (TRICOR) 145 MG tablet   hydrochlorothiazide (HYDRODIURIL) 25 MG tablet   rosuvastatin (CRESTOR) 10 MG tablet   Other Relevant Orders   Ambulatory referral to Vascular Surgery   Need for shingles vaccine       Relevant Orders   Varicella-zoster vaccine IM (Shingrix) (Completed)       Over the past year in our office he has been about 50% elevated and about 50% normal.  Instructed the patient to take more blood pressures at home, not just in the morning but at different times throughout the day.  Will not add another blood pressure medicine for now but will keep a close eye.  Continue other medicine we will check blood work.  Placed referral to vein and vascular to go back for yearly checkups. Follow up plan: Return in about 6 months (around 05/22/2022), or if symptoms worsen or fail to improve, for Hypertension and cholesterol  recheck.  Counseling provided for all of the vaccine components Orders Placed This Encounter  Procedures   Varicella-zoster vaccine IM (Shingrix)   Bayer DCA Hb A1c Waived   CBC with Differential/Platelet   CMP14+EGFR   Lipid panel   Ambulatory referral to Vascular Surgery    Caryl Pina, MD [facility] 11/22/2021, 9:09 AM

## 2021-11-23 ENCOUNTER — Telehealth: Payer: Self-pay | Admitting: Orthopaedic Surgery

## 2021-11-23 NOTE — Telephone Encounter (Signed)
Patient left voice message requesting medication, said in order to be able to do his MRI, which is scheduled 12/01/21. Patient uses [facility].

## 2021-11-24 MED ORDER — DIAZEPAM 10 MG PO TABS: 10.0000 mg | ORAL_TABLET | Freq: Once | ORAL | 0 refills | Status: AC

## 2021-11-24 NOTE — Telephone Encounter (Signed)
Called patient to notify;left message at cell phone number, left message.

## 2021-12-01 ENCOUNTER — Other Ambulatory Visit: Payer: Self-pay

## 2021-12-01 ENCOUNTER — Ambulatory Visit (HOSPITAL_COMMUNITY)
Admission: RE | Admit: 2021-12-01 | Discharge: 2021-12-01 | Disposition: A | Discharge status: Home / Self Care | Payer: Medicare HMO | Source: Ambulatory Visit | Attending: Orthopaedic Surgery | Admitting: Orthopaedic Surgery

## 2021-12-01 DIAGNOSIS — M4802 Spinal stenosis, cervical region: Secondary | ICD-10-CM | POA: Diagnosis not present

## 2021-12-01 DIAGNOSIS — M542 Cervicalgia: Secondary | ICD-10-CM | POA: Insufficient documentation

## 2021-12-01 DIAGNOSIS — M2578 Osteophyte, vertebrae: Secondary | ICD-10-CM | POA: Diagnosis not present

## 2021-12-02 ENCOUNTER — Ambulatory Visit: Payer: Medicare HMO | Admitting: Orthopaedic Surgery

## 2021-12-16 ENCOUNTER — Encounter: Payer: Self-pay | Admitting: Orthopaedic Surgery

## 2021-12-16 ENCOUNTER — Other Ambulatory Visit: Payer: Self-pay

## 2021-12-16 ENCOUNTER — Other Ambulatory Visit: Payer: Self-pay | Admitting: *Deleted

## 2021-12-16 ENCOUNTER — Ambulatory Visit: Payer: Medicare HMO | Admitting: Orthopaedic Surgery

## 2021-12-16 DIAGNOSIS — I739 Peripheral vascular disease, unspecified: Secondary | ICD-10-CM

## 2021-12-16 DIAGNOSIS — M542 Cervicalgia: Secondary | ICD-10-CM

## 2021-12-16 MED ORDER — HYDROCODONE-ACETAMINOPHEN 5-325 MG PO TABS
ORAL_TABLET | ORAL | 0 refills | Status: DC
Start: 2021-12-16 — End: 2022-05-23

## 2021-12-16 NOTE — Progress Notes (Signed)
My neck is sore. ? ?He has had neck pain and some paresthesias and weakness on the left side.  He had MRI which showed: ?IMPRESSION: ?1. No acute osseous abnormality. Chronic ankylosis of C2-C3. And new ?degenerative ankylosis of the right C3-C4 facet along with mild ?anterolisthesis there since a 2019 CT. ?  ?2. Subsequent advanced C4-C5 and C5-C6 disc and posterior element ?degeneration with mild spinal stenosis and mild cord mass effect at ?both levels. No cord signal abnormality. Associated moderate to ?severe C4 through C6 neural foraminal stenosis. ? ?I have explained the findings to him.  I will have neurosurgeon see him and evaluate him.  He has had more paresthesias to the left and is not improving. ? ?I have independently reviewed the MRI.   ? ?Neck has decreased ROM.  His left triceps is not weak this morning as last visit.  Grips are good.  NV intact. ? ?Encounter Diagnosis  ?Name Primary?  ? Neck pain Yes  ? ?I will have him see the neurosurgeon. ? ?I have reviewed the [place] web site prior to prescribing narcotic medicine for this patient. ? ?Call if any problem. ? ?Precautions discussed. ? ?Electronically Signed ?Sanjuana Kava, MD ?3/9/20238:15 AM ? ?

## 2021-12-21 DIAGNOSIS — Z6827 Body mass index (BMI) 27.0-27.9, adult: Secondary | ICD-10-CM | POA: Diagnosis not present

## 2021-12-21 DIAGNOSIS — M4722 Other spondylosis with radiculopathy, cervical region: Secondary | ICD-10-CM | POA: Diagnosis not present

## 2021-12-29 ENCOUNTER — Other Ambulatory Visit: Payer: Self-pay

## 2021-12-29 ENCOUNTER — Ambulatory Visit: Payer: Medicare HMO | Attending: Neurosurgery | Admitting: Physical Therapy

## 2021-12-29 ENCOUNTER — Encounter: Payer: Self-pay | Admitting: Physical Therapy

## 2021-12-29 DIAGNOSIS — R293 Abnormal posture: Secondary | ICD-10-CM | POA: Insufficient documentation

## 2021-12-29 DIAGNOSIS — M542 Cervicalgia: Secondary | ICD-10-CM | POA: Diagnosis not present

## 2021-12-29 NOTE — Therapy (Signed)
[place] ?[facility] ?[address] ?[place], [place], [address] ?Phone: [phone]   Fax:  [phone] ? ?Physical Therapy Evaluation ? ?Patient Details  ?Name: Tommy [name] ?MRN: [id number] ?Date of Birth: Jul 02, 1951 ?Referring Provider (PT): Ashok Pall MD ? ? ?Encounter Date: 12/29/2021 ? ? PT End of Session - 12/29/21 1155   ? ? Visit Number 1   ? Number of Visits 12   ? Date for PT Re-Evaluation 02/09/22   ? Authorization Type FOTO AT LEAST EVERY 5TH VISIT.  PROGRESS NOTE AT 10TH VISIT.  KX MODIFIER AFTER 15 VISITS.   ? PT Start Time 6967   ? PT Stop Time 1029   ? PT Time Calculation (min) 44 min   ? Activity Tolerance Patient tolerated treatment well   ? Behavior During Therapy [facility] for tasks assessed/performed   ? ?  ?  ? ?  ? ? ?Past Medical History:  ?Diagnosis Date  ? BPH with obstruction/lower urinary tract symptoms   ? hyperplasia  ? ED (erectile dysfunction)   ? arterial insuff.  ? History of DVT of lower extremity   ? History of gastric ulcer   ? Hyperlipidemia   ? Hypertension   ? Peripheral vascular disease ([place])   ? Prostate cancer [facility]) DX 01/01/15--  urologist-  dr Jeffie Pollock  ? Stage T1c, Gleason 3+4, PSA 5.73, vol 12m  ? Wears glasses   ? ? ?Past Surgical History:  ?Procedure Laterality Date  ? FEMORAL ARTERY STENT Left 07-22-2008  &  11-16-2010  ? left SFA//   In-Stent restenosis  ? ORIF LEFT INDEX FINGER FX  09-28-2006  ? PROSTATE BIOPSY  01/01/15  ? PROSTATE SURGERY  July 2016  ? Seed Implant  ? RADIOACTIVE SEED IMPLANT N/A 04/23/2015  ? Procedure: RADIOACTIVE SEED IMPLANT/BRACHYTHERAPY IMPLANT;  Surgeon: JIrine Seal MD;  [facility]  Service: Urology;  Laterality: N/A;  ? TONSILLECTOMY  as child  ? ? ?There were no vitals filed for this visit. ? ? ? Subjective Assessment - 12/29/21 1012   ? ? Subjective COVID-19 screen performed prior to patient entering clinic.  The patient presents to the clinic today with c/o bilateral neck pain and  radiation of pain into his left biceps to his elbow.  He states he woke up with the pain about 2 months ago.  He did relate a fall some time ago in which he was carrying a cooler for someone and tripped and fell and caught himself with extended arms on a pinic table.  He is not sure that this is related to his pain.   ? Pertinent History HTN, PVD, left femoral arthery stent, ORIF left index finger.   ? How long can you sit comfortably? Varies.   ? How long can you stand comfortably? Varies.   ? How long can you walk comfortably? No problem but by the end of a long work shift he is in a lot of pain.   ? Diagnostic tests MRI: 1.) No acute osseous abnormality. Chronic ankylosis of C2-C3. And new  degenerative ankylosis of the right C3-C4 facet along with mild  anterolisthesis there since a 2019 CT.     2. Subsequent advanced C4-C5 and C5-C6 disc and posterior element  degeneration with mild spinal stenosis and mild cord mass effect at  both levels. No cord signal abnormality. Associated moderate to  severe C4 through C6 neural foraminal stenosis.   ? Patient Stated Goals Get out o fpain.   ?  Currently in Pain? Yes   ? Pain Score 8    ? Pain Location Neck   ? Pain Orientation Right;Left   ? Pain Descriptors / Indicators Aching;Shooting   ? Pain Type Acute pain   ? Pain Radiating Towards Into left biceps to elbow.   ? Pain Onset More than a month ago   ? Pain Frequency Constant   ? Aggravating Factors  See above.   ? Pain Relieving Factors Rest.   ? ?  ?  ? ?  ? ? ? ? ? [facility] PT Assessment - 12/29/21 0001   ? ?  ? Assessment  ? Medical Diagnosis Cervical spondylosis with radiculopathy.   ? Referring Provider (PT) Ashok Pall MD   ? Onset Date/Surgical Date --   ~2 months.  ?  ? Precautions  ? Precautions None   ?  ? Restrictions  ? Weight Bearing Restrictions No   ?  ? Balance Screen  ? Has the patient fallen in the past 6 months Yes   ? How many times? 1.   ? Has the patient had a decrease in activity level because of a  fear of falling?  No   ? Is the patient reluctant to leave their home because of a fear of falling?  No   ?  ? Home Environment  ? Living Environment Private residence   ?  ? Prior Function  ? Level of Independence Independent   ?  ? Observation/Other Assessments  ? Focus on Therapeutic Outcomes (FOTO)  Complete.   ?  ? Posture/Postural Control  ? Posture/Postural Control Postural limitations   ? Postural Limitations Rounded Shoulders;Forward head   ? Posture Comments Flexed neck posture.  Left shoulder depression.   ?  ? Deep Tendon Reflexes  ? DTR Assessment Site Biceps;Brachioradialis;Triceps   ? Biceps DTR 0   ? Brachioradialis DTR 0   ? Triceps DTR 0   ?  ? ROM / Strength  ? AROM / PROM / Strength AROM;Strength   ?  ? AROM  ? Overall AROM Comments Active right cervical rotation to 52 degrees and left to 45 degrees.   ?  ? Strength  ? Overall Strength Comments Patient exhibits essentially normal UE strength.   ?  ? Palpation  ? Palpation comment tender to palpation diffusely over patient's bilateral cervical paraspinal musculature that is very taut to palpation.   ?  ? Ambulation/Gait  ? Gait Comments WNL.   ? ?  ?  ? ?  ? ? ? ? ? ? ? ? ? ? ? ? ? ?Objective measurements completed on examination: See above findings.  ? ? ? ? ? [facility] Adult PT Treatment/Exercise - 12/29/21 0001   ? ?  ? Modalities  ? Modalities Electrical Stimulation   ?  ? Electrical Stimulation  ? Electrical Stimulation Location Bilateral cervical musculature   ? Electrical Stimulation Action IFC at 80-150 Hz.   ? Electrical Stimulation Parameters 40% scan x 20 minutes.   ? Electrical Stimulation Goals Tone;Pain   ? ?  ?  ? ?  ? ? ? ? ? ? ? ? ? ? ? ? ? ? ? ? ? ? ? ? ? ? ? Plan - 12/29/21 1048   ? ? Clinical Impression Statement The patient presents to [facility] with c/o bilateral neck pain with radiation into his left biceps to his elbow.  The pain came on about 2 months ago.  He holds his head in  a flexed neck posture and he exhibits left shoulder  depression.  He has limited active cervical rotation.  He has diffuse bilateral cervical paraspinal musculature tendeness with increased tone and tautness.  He is in severe pain at the end of his work day.  He has normal UE strength.  UE DTR's wereessentially absent.  Patient will benefit from skilled physical therapy intervention to address pain and deficits.   ? Personal Factors and Comorbidities Comorbidity 1;Other   ? Examination-Activity Limitations Other   ? Examination-Participation Restrictions Other   ? Stability/Clinical Decision Making Stable/Uncomplicated   ? Rehab Potential Good   ? PT Frequency 2x / week   ? PT Duration 6 weeks   ? PT Treatment/Interventions ADLs/Self Care Home Management;Cryotherapy;Electrical Stimulation;Ultrasound;Moist Heat;Therapeutic activities;Therapeutic exercise;Manual techniques;Patient/family education;Passive range of motion;Dry needling   ? PT Next Visit Plan Postural exercises and try to improve chin tuck and cervical extension, combo e'stim/US and STW/M.   ? Consulted and Agree with Plan of Care Patient   ? ?  ?  ? ?  ? ? ?Patient will benefit from skilled therapeutic intervention in order to improve the following deficits and impairments:  Decreased activity tolerance, Decreased range of motion, Increased muscle spasms, Postural dysfunction, Pain ? ?Visit Diagnosis: ?Cervicalgia - Plan: PT plan of care cert/re-cert ? ?Abnormal posture - Plan: PT plan of care cert/re-cert ? ? ? ? ?Problem List ?Patient Active Problem List  ? Diagnosis Date Noted  ? History of colonic polyps 12/16/2020  ? CAD (coronary artery disease) 07/03/2019  ? GERD without esophagitis 07/03/2019  ? Prediabetes 07/05/2018  ? Overweight (BMI 25.0-29.9) 05/09/2018  ? Hyperlipidemia LDL goal <130 05/18/2015  ? Stage T1c adenocarcinoma of the prostate with a Gleason's Score of 3+4 and a PSA of 5.73 02/01/2015  ? Peripheral vascular disease, unspecified ([place]) 06/27/2012  ? PAD (peripheral artery disease)  ([place]) 02/09/2011  ? Essential hypertension 02/09/2011  ? ? ?Josy Peaden, [place], PT ?12/29/2021, 12:02 PM ? ?[place] ?[facility] ?[address] ?[place], [place], [address] ?Phon

## 2021-12-30 NOTE — Progress Notes (Signed)
?Office Note  ? ? ? ?CC:  follow up ?Requesting Provider:  [name], Fransisca Kaufmann, MD ? ?HPI: Tommy [name] is a 71 y.o. (07/01/1951) male who presents for follow up of peripheral artery disease. He was last seen on 2019. He has remote history of left SFA stenting in 2009 by Dr. Trula Slade. At his last visit he was without any lower extremity claudication, rest pain or tissue loss. His ABI's were 1.3 on the Right 1.3, 1.14 on the left.  ? ?Today he reports overall he has been feeling good. He denies any lower extremity claudication, rest pain or tissue loss. He was having some lower back issues, which then moved to his shoulders. He has been doing PT and now his shoulders are feeling better as well. He says he had MRI by his orthopedist, Dr. Luna Glasgow that showed AAA. He reports that he had 7 maternal uncles with Aneurysms. He has never had his previously evaluated. He has no abdominal pain and presently no back pain.  ? ?The pt is on a statin for cholesterol management.  ?The pt no on a daily aspirin.   Other AC:  Plavix ?The pt is on HCTZ, CCB for hypertension.   ?The pt not diabetic.   ?Tobacco hx:  Former, 2012 ? ?Past Medical History:  ?Diagnosis Date  ? BPH with obstruction/lower urinary tract symptoms   ? hyperplasia  ? ED (erectile dysfunction)   ? arterial insuff.  ? History of DVT of lower extremity   ? History of gastric ulcer   ? Hyperlipidemia   ? Hypertension   ? Peripheral vascular disease ([place])   ? Prostate cancer [facility]) DX 01/01/15--  urologist-  dr Jeffie Pollock  ? Stage T1c, Gleason 3+4, PSA 5.73, vol 34m  ? Wears glasses   ? ? ?Past Surgical History:  ?Procedure Laterality Date  ? FEMORAL ARTERY STENT Left 07-22-2008  &  11-16-2010  ? left SFA//   In-Stent restenosis  ? ORIF LEFT INDEX FINGER FX  09-28-2006  ? PROSTATE BIOPSY  01/01/15  ? PROSTATE SURGERY  July 2016  ? Seed Implant  ? RADIOACTIVE SEED IMPLANT N/A 04/23/2015  ? Procedure: RADIOACTIVE SEED IMPLANT/BRACHYTHERAPY IMPLANT;  Surgeon: JIrine Seal MD;   [facility]  Service: Urology;  Laterality: N/A;  ? TONSILLECTOMY  as child  ? ? ?Social History  ? ?Socioeconomic History  ? Marital status: Married  ?  Spouse name: PSilva Bandy ? Number of children: 3  ? Years of education: Not on file  ? Highest education level: Not on file  ?Occupational History  ?  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 Smoking status: Former  ?  Packs/day: 1.00  ?  Years: 15.00  ?  Pack years: 15.00  ?  Types: Cigarettes  ?  Start date: 09/09/2010  ?  Quit date: 04/21/2011  ?  Years since quitting: 10.7  ? Smokeless tobacco: Current  ?  Types: Snuff  ? Tobacco comments:  ?  occasional  someday dips tobacco  ?Vaping Use  ? Vaping Use: Never used  ?Substance and Sexual Activity  ? Alcohol use: No  ?  Alcohol/week: 0.0 standard drinks  ? Drug use: No  ? Sexual activity: Not on file  ?Other Topics Concern  ? Not on file  ?Social History Narrative  ? 2 daughters and one son - all live nearby  ? ?Social Determinants of Health  ? ?Financial Resource Strain:  Low Risk   ? Difficulty of Paying Living Expenses: Not hard at all  ?Food Insecurity: No Food Insecurity  ? Worried About Charity fundraiser in the Last Year: Never true  ? Ran Out of Food in the Last Year: Never true  ?Transportation Needs: No Transportation Needs  ? Lack of Transportation (Medical): No  ? Lack of Transportation (Non-Medical): No  ?Physical Activity: Sufficiently Active  ? Days of Exercise per Week: 7 days  ? Minutes of Exercise per Session: 40 min  ?Stress: No Stress Concern Present  ? Feeling of Stress : Not at all  ?Social Connections: Socially Integrated  ? Frequency of Communication with Friends and Family: More than three times a week  ? Frequency of Social Gatherings with Friends and Family: More than three times a week  ? Attends Religious Services: 1 to 4 times per year  ? Active Member of Clubs or Organizations: Yes  ? Attends Archivist  Meetings: 1 to 4 times per year  ? Marital Status: Married  ?Intimate Partner Violence: Not At Risk  ? Fear of Current or Ex-Partner: No  ? Emotionally Abused: No  ? Physically Abused: No  ? Sexually Abused: No  ? ? ?Family History  ?Problem Relation Age of Onset  ? Heart disease Mother   ? Cancer Mother   ? Deep vein thrombosis Mother   ? Diabetes Mother   ? Hyperlipidemia Mother   ? Hypertension Mother   ? Heart attack Mother   ? Peripheral vascular disease Mother   ? Cancer Father   ? Colon cancer Neg Hx   ? ? ?Current Outpatient Medications  ?Medication Sig Dispense Refill  ? acetaminophen (TYLENOL) 500 MG tablet Take 1 tablet (500 mg total) by mouth every 6 (six) hours as needed. 30 tablet 0  ? amLODipine (NORVASC) 10 MG tablet Take 1 tablet (10 mg total) by mouth daily. 90 tablet 3  ? clopidogrel (PLAVIX) 75 MG tablet Take 1 tablet (75 mg total) by mouth daily. 90 tablet 3  ? fenofibrate (TRICOR) 145 MG tablet Take 1 tablet (145 mg total) by mouth daily. 90 tablet 3  ? hydrochlorothiazide (HYDRODIURIL) 25 MG tablet Take 1 tablet (25 mg total) by mouth daily. 90 tablet 3  ? HYDROcodone-acetaminophen (NORCO/VICODIN) 5-325 MG tablet One tablet every six hours for pain.  Limit 7 days. 28 tablet 0  ? naproxen (NAPROSYN) 500 MG tablet Take 1 tablet (500 mg total) by mouth 2 (two) times daily with a meal. 60 tablet 5  ? rosuvastatin (CRESTOR) 10 MG tablet Take 1 tablet (10 mg total) by mouth every evening. 90 tablet 3  ? sodium chloride (OCEAN) 0.65 % SOLN nasal spray Place 1 spray into both nostrils as needed for congestion. 60 mL 1  ? tamsulosin (FLOMAX) 0.4 MG CAPS capsule Take 1 capsule (0.4 mg total) by mouth daily after supper. 90 capsule 3  ? esomeprazole (NEXIUM) 20 MG capsule Take 1 capsule (20 mg total) by mouth daily before breakfast. (Patient not taking: Reported on 01/03/2022) 90 capsule 3  ? tizanidine (ZANAFLEX) 2 MG capsule Take 1 capsule (2 mg total) by mouth 3 (three) times daily. (Patient not  taking: Reported on 01/03/2022) 45 capsule 0  ? ?No current facility-administered medications for this visit.  ? ? ?Allergies  ?Allergen Reactions  ? Penicillins Swelling  ?  Has patient had a PCN reaction causing immediate rash, facial/tongue/throat swelling, SOB or lightheadedness with hypotension: No ?Has patient had a  PCN reaction causing severe rash involving mucus membranes or skin necrosis: No ?Has patient had a PCN reaction that required hospitalization No ?Has patient had a PCN reaction occurring within the last 10 years: No ?If all of the above answers are "NO", then may proceed with Cephalosporin use.  ? ? ? ?REVIEW OF SYSTEMS:  ? ?'[X]'$  denotes positive finding, '[ ]'$  denotes negative finding ?Cardiac  Comments:  ?Chest pain or chest pressure:    ?Shortness of breath upon exertion:    ?Short of breath when lying flat:    ?Irregular heart rhythm:    ?    ?Vascular    ?Pain in calf, thigh, or hip brought on by ambulation:    ?Pain in feet at night that wakes you up from your sleep:     ?Blood clot in your veins:    ?Leg swelling:  X   ?    ?Pulmonary    ?Oxygen at home:    ?Productive cough:     ?Wheezing:     ?    ?Neurologic    ?Sudden weakness in arms or legs:     ?Sudden numbness in arms or legs:     ?Sudden onset of difficulty speaking or slurred speech:    ?Temporary loss of vision in one eye:     ?Problems with dizziness:     ?    ?Gastrointestinal    ?Blood in stool:     ?Vomited blood:     ?    ?Genitourinary    ?Burning when urinating:     ?Blood in urine:    ?    ?Psychiatric    ?Major depression:     ?    ?Hematologic    ?Bleeding problems:    ?Problems with blood clotting too easily:    ?    ?Skin    ?Rashes or ulcers:    ?    ?Constitutional    ?Fever or chills:    ? ? ?PHYSICAL EXAMINATION: ? ?Vitals:  ? 01/03/22 0838  ?BP: 130/70  ?Pulse: 70  ?Resp: (!) 22  ?Temp: 98.7 ?F (37.1 ?C)  ?TempSrc: Temporal  ?SpO2: 97%  ?Weight: 195 lb 12.8 oz (88.8 kg)  ?Height: '5\' 10"'$  (1.778 m)  ? ? ?General:  WDWN  in NAD; vital signs documented above ?Gait: Normal ?HENT: WNL, normocephalic ?Pulmonary: normal non-labored breathing , without wheezing ?Cardiac: regular HR, without  Murmurs without carotid bruit ?Abdomen: distended,

## 2022-01-03 ENCOUNTER — Other Ambulatory Visit: Payer: Self-pay

## 2022-01-03 ENCOUNTER — Encounter: Payer: Self-pay | Admitting: Physician Assistant

## 2022-01-03 ENCOUNTER — Ambulatory Visit (HOSPITAL_COMMUNITY)
Admission: RE | Admit: 2022-01-03 | Discharge: 2022-01-03 | Disposition: A | Discharge status: Home / Self Care | Payer: Medicare HMO | Source: Ambulatory Visit | Attending: Surgery | Admitting: Surgery

## 2022-01-03 ENCOUNTER — Ambulatory Visit: Payer: Medicare HMO | Admitting: Physician Assistant

## 2022-01-03 VITALS — BP 130/70 | HR 70 | Temp 98.7°F | Resp 22 | Ht 70.0 in | Wt 195.8 lb

## 2022-01-03 DIAGNOSIS — I739 Peripheral vascular disease, unspecified: Secondary | ICD-10-CM | POA: Diagnosis not present

## 2022-01-04 ENCOUNTER — Encounter: Payer: Self-pay | Admitting: Physical Therapy

## 2022-01-04 ENCOUNTER — Other Ambulatory Visit: Payer: Self-pay | Admitting: *Deleted

## 2022-01-04 ENCOUNTER — Ambulatory Visit: Payer: Medicare HMO | Admitting: Physical Therapy

## 2022-01-04 DIAGNOSIS — R293 Abnormal posture: Secondary | ICD-10-CM | POA: Diagnosis not present

## 2022-01-04 DIAGNOSIS — M542 Cervicalgia: Secondary | ICD-10-CM

## 2022-01-04 DIAGNOSIS — I739 Peripheral vascular disease, unspecified: Secondary | ICD-10-CM

## 2022-01-04 NOTE — Therapy (Addendum)
[place] ?[facility] ?[address] ?[place], [place], [address] ?Phone: [phone]   Fax:  [phone] ? ?Physical Therapy Treatment ? ?Patient Details  ?Name: Tommy [name] ?MRN: [id number] ?Date of Birth: [birth year] ?Referring Provider (PT): Ashok Pall MD ? ? ?Encounter Date: 01/04/2022 ? ? PT End of Session - 01/04/22 1142   ? ? Visit Number 2   ? Number of Visits 12   ? Date for PT Re-Evaluation 02/09/22   ? Authorization Type FOTO AT LEAST EVERY 5TH VISIT.  PROGRESS NOTE AT 10TH VISIT.  KX MODIFIER AFTER 15 VISITS.   ? PT Start Time 2952   ? PT Stop Time 8413   ? PT Time Calculation (min) 48 min   ? Activity Tolerance Patient tolerated treatment well   ? Behavior During Therapy [facility] for tasks assessed/performed   ? ?  ?  ? ?  ? ? ?Past Medical History:  ?Diagnosis Date  ? BPH with obstruction/lower urinary tract symptoms   ? hyperplasia  ? ED (erectile dysfunction)   ? arterial insuff.  ? History of DVT of lower extremity   ? History of gastric ulcer   ? Hyperlipidemia   ? Hypertension   ? Peripheral vascular disease ([place])   ? Prostate cancer [facility]) DX 01/01/15--  urologist-  dr Jeffie Pollock  ? Stage T1c, Gleason 3+4, PSA 5.73, vol 9m  ? Wears glasses   ? ? ?Past Surgical History:  ?Procedure Laterality Date  ? FEMORAL ARTERY STENT Left 07-22-2008  &  11-16-2010  ? left SFA//   In-Stent restenosis  ? ORIF LEFT INDEX FINGER FX  09-28-2006  ? PROSTATE BIOPSY  01/01/15  ? PROSTATE SURGERY  July 2016  ? Seed Implant  ? RADIOACTIVE SEED IMPLANT N/A 04/23/2015  ? Procedure: RADIOACTIVE SEED IMPLANT/BRACHYTHERAPY IMPLANT;  Surgeon: JIrine Seal MD;  [facility]  Service: Urology;  Laterality: N/A;  ? TONSILLECTOMY  as child  ? ? ?There were no vitals filed for this visit. ? ? Subjective Assessment - 01/04/22 1138   ? ? Subjective Neck felt better after first treatment.   ? Pertinent History HTN, PVD, left femoral arthery stent, ORIF left index finger.   ? How long can  you sit comfortably? Varies.   ? How long can you stand comfortably? Varies.   ? How long can you walk comfortably? No problem but by the end of a long work shift he is in a lot of pain.   ? Diagnostic tests MRI: 1.) No acute osseous abnormality. Chronic ankylosis of C2-C3. And new  degenerative ankylosis of the right C3-C4 facet along with mild  anterolisthesis there since a 2019 CT.     2. Subsequent advanced C4-C5 and C5-C6 disc and posterior element  degeneration with mild spinal stenosis and mild cord mass effect at  both levels. No cord signal abnormality. Associated moderate to  severe C4 through C6 neural foraminal stenosis.   ? Patient Stated Goals Get out of pain.   ? Currently in Pain? Yes   ? Pain Score 3    ? Pain Location Neck   ? Pain Orientation Right;Left   ? Pain Descriptors / Indicators Aching   ? Pain Onset More than a month ago   ? ?  ?  ? ?  ? ? ? ? ? ? ? ? ? ? ? ? ? ? ? ? ? ? ? ? [place] [place] Treatment[facility]Exercise - 01/04/22 0001   ? ?  ?  Modalities  ? Modalities Electrical Stimulation;Ultrasound   ?  ? Electrical Stimulation  ? Electrical Stimulation Location Bilateral cervical.   ? Electrical Stimulation Action IFC at 80-150 Hz.   ? Electrical Stimulation Parameters 40% scan x 17 minutes.   ? Electrical Stimulation Goals Tone;Pain   ?  ? Ultrasound  ? Ultrasound Location Affected cervical musculature.   ? Ultrasound Parameters Combo e'stim/US at 1.50 W/CM2 x 11 minutes.   ?  ? Manual Therapy  ? Manual Therapy Soft tissue mobilization   ? Manual therapy comments STW/M x 12 minutes to patient's affected cervical musculature.   ? ?  ?  ? ?  ? ? ? ? ? ? ? ? ? ? ? ? ? ? ? ? ? ? ? ? ? ? Plan - 01/04/22 1117   ? ? Clinical Impression Statement The patient pleased with his progress thus far and already reporting a significant decrease in his pain-level.  Normal modality response following removal of modality.   ? Personal Factors and Comorbidities Comorbidity 1;Other   ? Examination-Activity  Limitations Other   ? Examination-Participation Restrictions Other   ? Stability/Clinical Decision Making Stable/Uncomplicated   ? Rehab Potential Good   ? PT Frequency 2x / week   ? PT Duration 6 weeks   ? PT Treatment/Interventions ADLs/Self Care Home Management;Cryotherapy;Electrical Stimulation;Ultrasound;Moist Heat;Therapeutic activities;Therapeutic exercise;Manual techniques;Patient/family education;Passive range of motion;Dry needling   ? PT Next Visit Plan Postural exercises and try to improve chin tuck and cervical extension, combo e'stim/US and STW/M.   ? Consulted and Agree with Plan of Care Patient   ? ?  ?  ? ?  ? ? ?Patient will benefit from skilled therapeutic intervention in order to improve the following deficits and impairments:  Decreased activity tolerance, Decreased range of motion, Increased muscle spasms, Postural dysfunction, Pain ? ?Visit Diagnosis: ?Cervicalgia ? ? ? ? ?Problem List ?Patient Active Problem List  ? Diagnosis Date Noted  ? History of colonic polyps 12/16/2020  ? CAD (coronary artery disease) 07/03/2019  ? GERD without esophagitis 07/03/2019  ? Prediabetes 07/05/2018  ? Overweight (BMI 25.0-29.9) 05/09/2018  ? Hyperlipidemia LDL goal <130 05/18/2015  ? Stage T1c adenocarcinoma of the prostate with a Gleason's Score of 3+4 and a PSA of 5.73 02/01/2015  ? Peripheral vascular disease, unspecified ([place]) 06/27/2012  ? PAD (peripheral artery disease) ([place]) 02/09/2011  ? Essential hypertension 02/09/2011  ? ? ?Tommy [name], Tommy [name], PT ?01/04/2022, 3:31 PM ? ?[place] ?[facility] ?[address] ?[place], [place], [address] ?Phone: [phone]   Fax:  [phone] ? ?Name: Tommy [name] ?MRN: [id number] ?Date of Birth: [birth year] ? ? ? ?

## 2022-01-06 ENCOUNTER — Other Ambulatory Visit: Payer: Self-pay | Admitting: Family Medicine

## 2022-01-06 DIAGNOSIS — I739 Peripheral vascular disease, unspecified: Secondary | ICD-10-CM

## 2022-01-07 ENCOUNTER — Ambulatory Visit: Payer: Medicare HMO | Admitting: *Deleted

## 2022-01-07 DIAGNOSIS — R293 Abnormal posture: Secondary | ICD-10-CM | POA: Diagnosis not present

## 2022-01-07 DIAGNOSIS — M542 Cervicalgia: Secondary | ICD-10-CM

## 2022-01-07 NOTE — Therapy (Signed)
[place] ?[facility] ?[address] ?[place], [place], [address] ?Phone: [phone]   Fax:  [phone] ? ?Physical Therapy Treatment ? ?Patient Details  ?Name: Tommy [name] ?MRN: [id number] ?Date of Birth: [birth year] ?Referring Provider (PT): Ashok Pall MD ? ? ?Encounter Date: 01/07/2022 ? ? PT End of Session - 01/07/22 1034   ? ? Visit Number 3   ? Number of Visits 12   ? Date for PT Re-Evaluation 02/09/22   ? Authorization Type FOTO AT LEAST EVERY 5TH VISIT.  PROGRESS NOTE AT 10TH VISIT.  KX MODIFIER AFTER 15 VISITS.   ? PT Start Time 2876   ? PT Stop Time 1034   ? PT Time Calculation (min) 49 min   ? ?  ?  ? ?  ? ? ?Past Medical History:  ?Diagnosis Date  ? BPH with obstruction/lower urinary tract symptoms   ? hyperplasia  ? ED (erectile dysfunction)   ? arterial insuff.  ? History of DVT of lower extremity   ? History of gastric ulcer   ? Hyperlipidemia   ? Hypertension   ? Peripheral vascular disease ([place])   ? Prostate cancer [facility]) DX 01/01/15--  urologist-  dr Jeffie Pollock  ? Stage T1c, Gleason 3+4, PSA 5.73, vol 64m  ? Wears glasses   ? ? ?Past Surgical History:  ?Procedure Laterality Date  ? FEMORAL ARTERY STENT Left 07-22-2008  &  11-16-2010  ? left SFA//   In-Stent restenosis  ? ORIF LEFT INDEX FINGER FX  09-28-2006  ? PROSTATE BIOPSY  01/01/15  ? PROSTATE SURGERY  July 2016  ? Seed Implant  ? RADIOACTIVE SEED IMPLANT N/A 04/23/2015  ? Procedure: RADIOACTIVE SEED IMPLANT/BRACHYTHERAPY IMPLANT;  Surgeon: JIrine Seal MD;  [facility]  Service: Urology;  Laterality: N/A;  ? TONSILLECTOMY  as child  ? ? ?There were no vitals filed for this visit. ? ? Subjective Assessment - 01/07/22 1045   ? ? Subjective Doing better with no neck pain this AM. 30-40% better over all.   ? Pertinent History HTN, PVD, left femoral arthery stent, ORIF left index finger.   ? Limitations House hold activities   ? How long can you sit comfortably? Varies.   ? How long can you stand  comfortably? Varies.   ? How long can you walk comfortably? No problem but by the end of a long work shift he is in a lot of pain.   ? Diagnostic tests MRI: 1.) No acute osseous abnormality. Chronic ankylosis of C2-C3. And new  degenerative ankylosis of the right C3-C4 facet along with mild  anterolisthesis there since a 2019 CT.     2. Subsequent advanced C4-C5 and C5-C6 disc and posterior element  degeneration with mild spinal stenosis and mild cord mass effect at  both levels. No cord signal abnormality. Associated moderate to  severe C4 through C6 neural foraminal stenosis.   ? ?  ?  ? ?  ? ? ? ? ? ? ? ? ? ? ? ? ? ? ? ? ? ? ? ? [facility]/Exercise - 01/07/22 0001   ? ?  ? Exercises  ? Exercises Neck   ?  ? Neck Exercises: Standing  ? Neck Retraction 5 reps   gentle cervical retractions  ? Other Standing Exercises cervical rotations AROM x 5 to each side to maintain ROM   ?  ? Modalities  ? Modalities Electrical Stimulation;Ultrasound   ?  ? Electrical Stimulation  ?  Electrical Stimulation Location Bilateral cervical.   ? Electrical Stimulation Action IFC x15 mins   ? Electrical Stimulation Parameters 40% scan 80-'150hz'$    ? Electrical Stimulation Goals Tone;Pain   ?  ? Ultrasound  ? Ultrasound Location RT cerv paras and UT   ? Ultrasound Parameters Combo at 1.5 w/cm2 x 12 mins   ? Ultrasound Goals Pain   ?  ? Manual Therapy  ? Manual Therapy Soft tissue mobilization   ? Manual therapy comments STW/M x 12 minutes to patient's Bil cerv paras and RT UT cervical musculature.   ? ?  ?  ? ?  ? ? ? ? ? ? ? ? ? ? ? ? ? ? ? ? ? ? ? ? ? ? Plan - 01/07/22 1034   ? ? Clinical Impression Statement Pt arrived today doing fairly well with no pain this AM. He reports doing 30-40% better since starting PT. He was instructed in chin tucks and cervical rotation today for HEP and given handout. STW and [place] combo were also performed and tolerated well. Pt with notable tightness Bil cerv paras and RT UT before Rx and a  notable decrease end of session   ? Personal Factors and Comorbidities Comorbidity 1;Other   ? Examination-Activity Limitations Other   ? Examination-Participation Restrictions Other   ? Stability/Clinical Decision Making Stable/Uncomplicated   ? Rehab Potential Good   ? PT Frequency 2x / week   ? PT Duration 6 weeks   ? PT Treatment/Interventions ADLs/Self Care Home Management;Cryotherapy;Electrical Stimulation;Ultrasound;Moist Heat;Therapeutic activities;Therapeutic exercise;Manual techniques;Patient/family education;Passive range of motion;Dry needling   ? PT Next Visit Plan Postural exercises and try to improve chin tuck and cervical extension, combo e'stim/US and STW/M.   ? Consulted and Agree with Plan of Care Patient   ? ?  ?  ? ?  ? ? ?Patient will benefit from skilled therapeutic intervention in order to improve the following deficits and impairments:  Decreased activity tolerance, Decreased range of motion, Increased muscle spasms, Postural dysfunction, Pain ? ?Visit Diagnosis: ?Cervicalgia ? ?Abnormal posture ? ? ? ? ?Problem List ?Patient Active Problem List  ? Diagnosis Date Noted  ? History of colonic polyps 12/16/2020  ? CAD (coronary artery disease) 07/03/2019  ? GERD without esophagitis 07/03/2019  ? Prediabetes 07/05/2018  ? Overweight (BMI 25.0-29.9) 05/09/2018  ? Hyperlipidemia LDL goal <130 05/18/2015  ? Stage T1c adenocarcinoma of the prostate with a Gleason's Score of 3+4 and a PSA of 5.73 02/01/2015  ? Peripheral vascular disease, unspecified ([place]) 06/27/2012  ? PAD (peripheral artery disease) ([place]) 02/09/2011  ? Essential hypertension 02/09/2011  ? ? ?Kiarrah Rausch,CHRIS, PTA ?01/07/2022, 10:53 AM ? ?[place] ?[facility] ?[address] ?[place], [place], [address] ?Phone: [phone]   Fax:  [phone] ? ?Name: Tommy [name] ?MRN: [id number] ?Date of Birth: 1951/01/17 ? ? ? ?

## 2022-01-12 ENCOUNTER — Ambulatory Visit: Payer: Medicare HMO | Attending: Neurosurgery

## 2022-01-12 DIAGNOSIS — R293 Abnormal posture: Secondary | ICD-10-CM | POA: Insufficient documentation

## 2022-01-12 DIAGNOSIS — M542 Cervicalgia: Secondary | ICD-10-CM | POA: Diagnosis not present

## 2022-01-12 NOTE — Therapy (Signed)
[place] ?[facility] ?[address] ?[place], [place], [address] ?Phone: [phone]   Fax:  [phone] ? ?Physical Therapy Treatment ? ?Patient Details  ?Name: Tommy [name] ?MRN: [id number] ?Date of Birth: 14-Jan-1951 ?Referring Provider (PT): Ashok Pall MD ? ? ?Encounter Date: 01/12/2022 ? ? PT End of Session - 01/12/22 0950   ? ? Visit Number 4   ? Number of Visits 12   ? Date for PT Re-Evaluation 02/09/22   ? Authorization Type FOTO AT LEAST EVERY 5TH VISIT.  PROGRESS NOTE AT 10TH VISIT.  KX MODIFIER AFTER 15 VISITS.   ? PT Start Time 4650   ? PT Stop Time 1028   ? PT Time Calculation (min) 43 min   ? ?  ?  ? ?  ? ? ?Past Medical History:  ?Diagnosis Date  ? BPH with obstruction/lower urinary tract symptoms   ? hyperplasia  ? ED (erectile dysfunction)   ? arterial insuff.  ? History of DVT of lower extremity   ? History of gastric ulcer   ? Hyperlipidemia   ? Hypertension   ? Peripheral vascular disease ([place])   ? Prostate cancer [facility]) DX 01/01/15--  urologist-  dr Jeffie Pollock  ? Stage T1c, Gleason 3+4, PSA 5.73, vol 33m  ? Wears glasses   ? ? ?Past Surgical History:  ?Procedure Laterality Date  ? FEMORAL ARTERY STENT Left 07-22-2008  &  11-16-2010  ? left SFA//   In-Stent restenosis  ? ORIF LEFT INDEX FINGER FX  09-28-2006  ? PROSTATE BIOPSY  01/01/15  ? PROSTATE SURGERY  July 2016  ? Seed Implant  ? RADIOACTIVE SEED IMPLANT N/A 04/23/2015  ? Procedure: RADIOACTIVE SEED IMPLANT/BRACHYTHERAPY IMPLANT;  Surgeon: JIrine Seal MD;  [facility]  Service: Urology;  Laterality: N/A;  ? TONSILLECTOMY  as child  ? ? ?There were no vitals filed for this visit. ? ? Subjective Assessment - 01/12/22 0949   ? ? Subjective Pt arrives for today's treatment session denying any pain.   ? Pertinent History HTN, PVD, left femoral arthery stent, ORIF left index finger.   ? Limitations House hold activities   ? How long can you sit comfortably? Varies.   ? How long can you stand  comfortably? Varies.   ? How long can you walk comfortably? No problem but by the end of a long work shift he is in a lot of pain.   ? Diagnostic tests MRI: 1.) No acute osseous abnormality. Chronic ankylosis of C2-C3. And new  degenerative ankylosis of the right C3-C4 facet along with mild  anterolisthesis there since a 2019 CT.     2. Subsequent advanced C4-C5 and C5-C6 disc and posterior element  degeneration with mild spinal stenosis and mild cord mass effect at  both levels. No cord signal abnormality. Associated moderate to  severe C4 through C6 neural foraminal stenosis.   ? Currently in Pain? No/denies   ? ?  ?  ? ?  ? ? ? ? ? ? ? ? ? ? ? ? ? ? ? ? ? ? ? ? [facility] Treatment/Exercise - 01/12/22 0001   ? ?  ? Exercises  ? Exercises Neck;Shoulder   ?  ? Neck Exercises: Machines for Strengthening  ? UBE (Upper Arm Bike) 8 mins (forward/backward)   ?  ? Neck Exercises: Theraband  ? Shoulder Extension 20 reps;Red   ? Rows 20 reps;Red   ? Shoulder External Rotation 20 reps;Red   ?  Horizontal ABduction 20 reps;Red   ?  ? Neck Exercises: Seated  ? Cervical Rotation 20 reps   ? X to V 20 reps   ? Shoulder Flexion Both;20 reps   red tband  ?  ? Modalities  ? Modalities Electrical Stimulation   ?  ? Electrical Stimulation  ? Electrical Stimulation Location Right upper trap   ? Electrical Stimulation Action IFC   ? Electrical Stimulation Parameters 80-150 Hz x 15 mins   ? Electrical Stimulation Goals Tone   ?  ? Manual Therapy  ? Manual Therapy Soft tissue mobilization   ? Soft tissue mobilization STW/M to right upper trap to decrease tone   ? ?  ?  ? ?  ? ? ? ? ? ? ? ? ? ? ? ? ? ? ? ? ? ? ? ? ? ? Plan - 01/12/22 0951   ? ? Clinical Impression Statement Pt arrives for today's treatment session denying any pain.  Pt able to tolerate warm-up on UBE to increase activity tolerance and endurance.  Pt instructed in numerous resisted seated exercises to increase strength and function while decreasing pain.  Pt requiring  min cues for proper technique and posture and well as cues to not rush through reps.  STW/M performed to right upper trap to decrease tone.  Normal responses to estim noted upon removal.  Pt reported 0/10 neck pain at completion of today's treatment session.   ? Personal Factors and Comorbidities Comorbidity 1;Other   ? Examination-Activity Limitations Other   ? Examination-Participation Restrictions Other   ? Stability/Clinical Decision Making Stable/Uncomplicated   ? Rehab Potential Good   ? PT Frequency 2x / week   ? PT Duration 6 weeks   ? PT Treatment/Interventions ADLs/Self Care Home Management;Cryotherapy;Electrical Stimulation;Ultrasound;Moist Heat;Therapeutic activities;Therapeutic exercise;Manual techniques;Patient/family education;Passive range of motion;Dry needling   ? PT Next Visit Plan Postural exercises and try to improve chin tuck and cervical extension, combo e'stim/US and STW/M.   ? Consulted and Agree with Plan of Care Patient   ? ?  ?  ? ?  ? ? ?Patient will benefit from skilled therapeutic intervention in order to improve the following deficits and impairments:  Decreased activity tolerance, Decreased range of motion, Increased muscle spasms, Postural dysfunction, Pain ? ?Visit Diagnosis: ?Cervicalgia ? ?Abnormal posture ? ? ? ? ?Problem List ?Patient Active Problem List  ? Diagnosis Date Noted  ? History of colonic polyps 12/16/2020  ? CAD (coronary artery disease) 07/03/2019  ? GERD without esophagitis 07/03/2019  ? Prediabetes 07/05/2018  ? Overweight (BMI 25.0-29.9) 05/09/2018  ? Hyperlipidemia LDL goal <130 05/18/2015  ? Stage T1c adenocarcinoma of the prostate with a Gleason's Score of 3+4 and a PSA of 5.73 02/01/2015  ? Peripheral vascular disease, unspecified ([place]) 06/27/2012  ? PAD (peripheral artery disease) ([place]) 02/09/2011  ? Essential hypertension 02/09/2011  ? ? ?Kathrynn Ducking, PTA ?01/12/2022, 10:31 AM ? ?[place] ?[facility] ?[address] ?[place], [place], [address] ?Phone: [phone]   Fax:  [phone] ? ?Name: CELEDONIO SORTINO ?MRN: [id number] ?Date of Birth: 07-14-1951 ? ? ? ?

## 2022-01-17 ENCOUNTER — Other Ambulatory Visit: Payer: Self-pay | Admitting: Family Medicine

## 2022-01-17 ENCOUNTER — Ambulatory Visit: Payer: Medicare HMO | Admitting: *Deleted

## 2022-01-17 DIAGNOSIS — M542 Cervicalgia: Secondary | ICD-10-CM | POA: Diagnosis not present

## 2022-01-17 DIAGNOSIS — R293 Abnormal posture: Secondary | ICD-10-CM

## 2022-01-17 DIAGNOSIS — E785 Hyperlipidemia, unspecified: Secondary | ICD-10-CM

## 2022-01-17 NOTE — Therapy (Signed)
[place] ?[facility] ?[address] ?[place], [place], [address] ?Phone: [phone]   Fax:  [phone] ? ?Physical Therapy Treatment ? ?Patient Details  ?Name: Tommy [name] ?MRN: [id number] ?Date of Birth: 07-23-1951 ?Referring Provider (PT): Ashok Pall MD ? ? ?Encounter Date: 01/17/2022 ? ? PT End of Session - 01/17/22 1043   ? ? Visit Number 5   ? Number of Visits 12   ? Date for PT Re-Evaluation 02/09/22   ? Authorization Type FOTO AT LEAST EVERY 5TH VISIT.  PROGRESS NOTE AT 10TH VISIT.  KX MODIFIER AFTER 15 VISITS.   ? PT Start Time 1572   ? PT Stop Time 1035   ? PT Time Calculation (min) 50 min   ? ?  ?  ? ?  ? ? ?Past Medical History:  ?Diagnosis Date  ? BPH with obstruction/lower urinary tract symptoms   ? hyperplasia  ? ED (erectile dysfunction)   ? arterial insuff.  ? History of DVT of lower extremity   ? History of gastric ulcer   ? Hyperlipidemia   ? Hypertension   ? Peripheral vascular disease ([place])   ? Prostate cancer [facility]) DX 01/01/15--  urologist-  dr Jeffie Pollock  ? Stage T1c, Gleason 3+4, PSA 5.73, vol 18m  ? Wears glasses   ? ? ?Past Surgical History:  ?Procedure Laterality Date  ? FEMORAL ARTERY STENT Left 07-22-2008  &  11-16-2010  ? left SFA//   In-Stent restenosis  ? ORIF LEFT INDEX FINGER FX  09-28-2006  ? PROSTATE BIOPSY  01/01/15  ? PROSTATE SURGERY  July 2016  ? Seed Implant  ? RADIOACTIVE SEED IMPLANT N/A 04/23/2015  ? Procedure: RADIOACTIVE SEED IMPLANT/BRACHYTHERAPY IMPLANT;  Surgeon: JIrine Seal MD;  [facility]  Service: Urology;  Laterality: N/A;  ? TONSILLECTOMY  as child  ? ? ?There were no vitals filed for this visit. ? ? Subjective Assessment - 01/17/22 1001   ? ? Subjective Pt arrives for today's treatment session with minimal pain.Says he did good after last Rx   ? Pertinent History HTN, PVD, left femoral arthery stent, ORIF left index finger.   ? How long can you sit comfortably? Varies.   ? How long can you stand comfortably?  Varies.   ? How long can you walk comfortably? No problem but by the end of a long work shift he is in a lot of pain.   ? Diagnostic tests MRI: 1.) No acute osseous abnormality. Chronic ankylosis of C2-C3. And new  degenerative ankylosis of the right C3-C4 facet along with mild  anterolisthesis there since a 2019 CT.     2. Subsequent advanced C4-C5 and C5-C6 disc and posterior element  degeneration with mild spinal stenosis and mild cord mass effect at  both levels. No cord signal abnormality. Associated moderate to  severe C4 through C6 neural foraminal stenosis.   ? Patient Stated Goals Get out of pain.   ? Currently in Pain? Yes   ? Pain Score 3    ? Pain Location Neck   ? Pain Orientation Right;Left   ? Pain Descriptors / Indicators Sore   ? ?  ?  ? ?  ? ? ? ? ? ? ? ? ? ? ? ? ? ? ? ? ? ? ? ? [facility] Adult PT Treatment/Exercise - 01/17/22 0001   ? ?  ? Exercises  ? Exercises Neck;Shoulder   ?  ? Neck Exercises: Machines for Strengthening  ? UBE (  Upper Arm Bike) 6 mins (forward/backward)   ?  ? Neck Exercises: Theraband  ? Horizontal ABduction 20 reps;Red;10 reps   ?  ? Modalities  ? Modalities Electrical Stimulation   ?  ? Electrical Stimulation  ? Electrical Stimulation Location Bilateral cervical.paras   ? Electrical Stimulation Action IFC   ? Electrical Stimulation Parameters 80-'150hz'$  x 15 mins   ? Electrical Stimulation Goals Tone   ?  ? Ultrasound  ? Ultrasound Location RT cerv paras   ? Ultrasound Parameters Combo 1.5 w/cm2 x 8 mins   ? Ultrasound Goals Pain   ?  ? Manual Therapy  ? Manual Therapy Soft tissue mobilization   ? Soft tissue mobilization STW/M to right upper trap and cerv paras to decrease tone   ? ?  ?  ? ?  ? ? ? ? ? ? ? ? ? ? ? ? ? ? ? ? ? ? ? ? ? ? Plan - 01/17/22 1535   ? ? Clinical Impression Statement Pt arrived today doing fairly well with minimal c/o pain.He was able to perform postural exs without increased pain.Combo and STW also performed to cervical paras with notable tenderness RT  side. Pt reports no pain end of session.   ? Personal Factors and Comorbidities Comorbidity 1;Other   ? Examination-Activity Limitations Other   ? Stability/Clinical Decision Making Stable/Uncomplicated   ? Rehab Potential Good   ? PT Duration 6 weeks   ? PT Treatment/Interventions ADLs/Self Care Home Management;Cryotherapy;Electrical Stimulation;Ultrasound;Moist Heat;Therapeutic activities;Therapeutic exercise;Manual techniques;Patient/family education;Passive range of motion;Dry needling   ? PT Next Visit Plan Postural exercises and try to improve chin tuck and cervical extension, combo e'stim/US and STW/M.   ? Consulted and Agree with Plan of Care Patient   ? ?  ?  ? ?  ? ? ?Patient will benefit from skilled therapeutic intervention in order to improve the following deficits and impairments:  Decreased activity tolerance, Decreased range of motion, Increased muscle spasms, Postural dysfunction, Pain ? ?Visit Diagnosis: ?Cervicalgia ? ?Abnormal posture ? ? ? ? ?Problem List ?Patient Active Problem List  ? Diagnosis Date Noted  ? History of colonic polyps 12/16/2020  ? CAD (coronary artery disease) 07/03/2019  ? GERD without esophagitis 07/03/2019  ? Prediabetes 07/05/2018  ? Overweight (BMI 25.0-29.9) 05/09/2018  ? Hyperlipidemia LDL goal <130 05/18/2015  ? Stage T1c adenocarcinoma of the prostate with a Gleason's Score of 3+4 and a PSA of 5.73 02/01/2015  ? Peripheral vascular disease, unspecified ([place]) 06/27/2012  ? PAD (peripheral artery disease) ([place]) 02/09/2011  ? Essential hypertension 02/09/2011  ? ? ?Terris Germano,CHRIS, PTA ?01/17/2022, 3:48 PM ? ?[place] ?[facility] ?[place] ?[place], [place], [address] ?Phone: [phone]   Fax:  [phone] ? ?Name: Tommy [name] ?MRN: [id number] ?Date of Birth: 23-Dec-1950 ? ? ? ?

## 2022-01-21 ENCOUNTER — Ambulatory Visit: Payer: Medicare HMO

## 2022-01-21 DIAGNOSIS — R293 Abnormal posture: Secondary | ICD-10-CM

## 2022-01-21 DIAGNOSIS — M542 Cervicalgia: Secondary | ICD-10-CM

## 2022-01-21 NOTE — Therapy (Signed)
[place] ?[facility] ?[address] ?[place], [place], [address] ?Phone: [phone]   Fax:  [phone] ? ?Physical Therapy Treatment ? ?Patient Details  ?Name: Tommy [name] ?MRN: [id number] ?Date of Birth: 18-Aug-1951 ?Referring Provider (PT): Ashok Pall MD ? ? ?Encounter Date: 01/21/2022 ? ? PT End of Session - 01/21/22 0948   ? ? Visit Number 6   ? Number of Visits 12   ? Date for PT Re-Evaluation 02/09/22   ? Authorization Type FOTO AT LEAST EVERY 5TH VISIT.  PROGRESS NOTE AT 10TH VISIT.  KX MODIFIER AFTER 15 VISITS.   ? PT Start Time 3976   ? PT Stop Time 1029   ? PT Time Calculation (min) 44 min   ? ?  ?  ? ?  ? ? ?Past Medical History:  ?Diagnosis Date  ? BPH with obstruction/lower urinary tract symptoms   ? hyperplasia  ? ED (erectile dysfunction)   ? arterial insuff.  ? History of DVT of lower extremity   ? History of gastric ulcer   ? Hyperlipidemia   ? Hypertension   ? Peripheral vascular disease ([place])   ? Prostate cancer [facility]) DX 01/01/15--  urologist-  dr Jeffie Pollock  ? Stage T1c, Gleason 3+4, PSA 5.73, vol 69m  ? Wears glasses   ? ? ?Past Surgical History:  ?Procedure Laterality Date  ? FEMORAL ARTERY STENT Left 07-22-2008  &  11-16-2010  ? left SFA//   In-Stent restenosis  ? ORIF LEFT INDEX FINGER FX  09-28-2006  ? PROSTATE BIOPSY  01/01/15  ? PROSTATE SURGERY  July 2016  ? Seed Implant  ? RADIOACTIVE SEED IMPLANT N/A 04/23/2015  ? Procedure: RADIOACTIVE SEED IMPLANT/BRACHYTHERAPY IMPLANT;  Surgeon: JIrine Seal MD;  [facility]  Service: Urology;  Laterality: N/A;  ? TONSILLECTOMY  as child  ? ? ?There were no vitals filed for this visit. ? ? Subjective Assessment - 01/21/22 0947   ? ? Subjective Pt arrives for today's treatment session denying any pain.   ? Pertinent History HTN, PVD, left femoral arthery stent, ORIF left index finger.   ? How long can you sit comfortably? Varies.   ? How long can you stand comfortably? Varies.   ? How long can you  walk comfortably? No problem but by the end of a long work shift he is in a lot of pain.   ? Diagnostic tests MRI: 1.) No acute osseous abnormality. Chronic ankylosis of C2-C3. And new  degenerative ankylosis of the right C3-C4 facet along with mild  anterolisthesis there since a 2019 CT.     2. Subsequent advanced C4-C5 and C5-C6 disc and posterior element  degeneration with mild spinal stenosis and mild cord mass effect at  both levels. No cord signal abnormality. Associated moderate to  severe C4 through C6 neural foraminal stenosis.   ? Patient Stated Goals Get out of pain.   ? Currently in Pain? No/denies   ? ?  ?  ? ?  ? ? ? ? ? ? ? ? ? ? ? ? ? ? ? ? ? ? ? ? [facility] Treatment/Exercise - 01/21/22 0001   ? ?  ? Neck Exercises: Machines for Strengthening  ? UBE (Upper Arm Bike) 10 min (forward/backward)   ?  ? Neck Exercises: Theraband  ? Shoulder Extension 20 reps;Red   ? Rows 20 reps;Red   ? Shoulder External Rotation 20 reps;Red   ? Horizontal ABduction 20 reps;Red   ?  ?  Neck Exercises: Seated  ? Shoulder Flexion Both;20 reps   ? Shoulder Flexion Limitations red tband   ?  ? Modalities  ? Modalities Electrical Stimulation   ?  ? Electrical Stimulation  ? Electrical Stimulation Location Right upper trap   ? Electrical Stimulation Action IFC   ? Electrical Stimulation Parameters 80-150 Hz x 15 mins   ? Electrical Stimulation Goals Tone   ? ?  ?  ? ?  ? ? ? ? ? ? ? ? ? ? ? ? ? ? ? ? ? ? ? ? ? ? Plan - 01/21/22 0948   ? ? Clinical Impression Statement Pt arrives for today's treatment session denying any pain.  Pt reports that his shoulder is feeling much better, but expects a little pain today due to the rainy weather.  Pt requiring min cues for proper technique with various exercises, but exhibits good carryover with cues.  Pt denied any pain at completion of today's treatment session.   ? Personal Factors and Comorbidities Comorbidity 1;Other   ? Examination-Activity Limitations Other   ?  Stability/Clinical Decision Making Stable/Uncomplicated   ? Rehab Potential Good   ? PT Duration 6 weeks   ? PT Treatment/Interventions ADLs/Self Care Home Management;Cryotherapy;Electrical Stimulation;Ultrasound;Moist Heat;Therapeutic activities;Therapeutic exercise;Manual techniques;Patient/family education;Passive range of motion;Dry needling   ? PT Next Visit Plan Postural exercises and try to improve chin tuck and cervical extension, combo e'stim/US and STW/M.   ? Consulted and Agree with Plan of Care Patient   ? ?  ?  ? ?  ? ? ?Patient will benefit from skilled therapeutic intervention in order to improve the following deficits and impairments:  Decreased activity tolerance, Decreased range of motion, Increased muscle spasms, Postural dysfunction, Pain ? ?Visit Diagnosis: ?Cervicalgia ? ?Abnormal posture ? ? ? ? ?Problem List ?Patient Active Problem List  ? Diagnosis Date Noted  ? History of colonic polyps 12/16/2020  ? CAD (coronary artery disease) 07/03/2019  ? GERD without esophagitis 07/03/2019  ? Prediabetes 07/05/2018  ? Overweight (BMI 25.0-29.9) 05/09/2018  ? Hyperlipidemia LDL goal <130 05/18/2015  ? Stage T1c adenocarcinoma of the prostate with a Gleason's Score of 3+4 and a PSA of 5.73 02/01/2015  ? Peripheral vascular disease, unspecified ([place]) 06/27/2012  ? PAD (peripheral artery disease) ([place]) 02/09/2011  ? Essential hypertension 02/09/2011  ? ? ?Kathrynn Ducking, PTA ?01/21/2022, 10:30 AM ? ?[place] ?[facility] ?[address] ?[place], [place], [address] ?Phone: [phone]   Fax:  [phone] ? ?Name: Tommy [name] ?MRN: [id number] ?Date of Birth: 02-22-1951 ? ? ? ?

## 2022-01-26 ENCOUNTER — Ambulatory Visit: Payer: Medicare HMO | Admitting: Physical Therapy

## 2022-01-26 ENCOUNTER — Encounter: Payer: Self-pay | Admitting: Physical Therapy

## 2022-01-26 DIAGNOSIS — R293 Abnormal posture: Secondary | ICD-10-CM | POA: Diagnosis not present

## 2022-01-26 DIAGNOSIS — M542 Cervicalgia: Secondary | ICD-10-CM | POA: Diagnosis not present

## 2022-01-26 NOTE — Therapy (Addendum)
[place] [facility] ?[address] ?[place], [place], [address] ?Phone: [phone]   Fax:  [phone] ? ?Physical Therapy Treatment ? ?Patient Details  ?Name: Tommy [name] ?MRN: [id number] ?Date of Birth: 11-02-1950 ?Referring Provider (PT): Ashok Pall MD ? ? ?Encounter Date: 01/26/2022 ? ? PT End of Session - 01/26/22 0905   ? ? Visit Number 7   ? Number of Visits 12   ? Date for PT Re-Evaluation 02/09/22   ? Authorization Type FOTO AT LEAST EVERY 5TH VISIT.  PROGRESS NOTE AT 10TH VISIT.  KX MODIFIER AFTER 15 VISITS.   ? PT Start Time 0901   ? PT Stop Time 0940   ? PT Time Calculation (min) 39 min   ? Activity Tolerance Patient tolerated treatment well   ? Behavior During Therapy [facility] for tasks assessed/performed   ? ?  ?  ? ?  ? ? ?Past Medical History:  ?Diagnosis Date  ? BPH with obstruction/lower urinary tract symptoms   ? hyperplasia  ? ED (erectile dysfunction)   ? arterial insuff.  ? History of DVT of lower extremity   ? History of gastric ulcer   ? Hyperlipidemia   ? Hypertension   ? Peripheral vascular disease ([place])   ? Prostate cancer [facility]) DX 01/01/15--  urologist-  dr Jeffie Pollock  ? Stage T1c, Gleason 3+4, PSA 5.73, vol 70m  ? Wears glasses   ? ? ?Past Surgical History:  ?Procedure Laterality Date  ? FEMORAL ARTERY STENT Left 07-22-2008  &  11-16-2010  ? left SFA//   In-Stent restenosis  ? ORIF LEFT INDEX FINGER FX  09-28-2006  ? PROSTATE BIOPSY  01/01/15  ? PROSTATE SURGERY  July 2016  ? Seed Implant  ? RADIOACTIVE SEED IMPLANT N/A 04/23/2015  ? Procedure: RADIOACTIVE SEED IMPLANT/BRACHYTHERAPY IMPLANT;  Surgeon: JIrine Seal MD;  [facility]  Service: Urology;  Laterality: N/A;  ? TONSILLECTOMY  as child  ? ? ?There were no vitals filed for this visit. ? ? Subjective Assessment - 01/26/22 0904   ? ? Subjective Denies pain.   ? Pertinent History HTN, PVD, left femoral arthery stent, ORIF left index finger.   ? Limitations House hold activities   ? How  long can you sit comfortably? Varies.   ? How long can you stand comfortably? Varies.   ? How long can you walk comfortably? No problem but by the end of a long work shift he is in a lot of pain.   ? Diagnostic tests MRI: 1.) No acute osseous abnormality. Chronic ankylosis of C2-C3. And new  degenerative ankylosis of the right C3-C4 facet along with mild  anterolisthesis there since a 2019 CT.     2. Subsequent advanced C4-C5 and C5-C6 disc and posterior element  degeneration with mild spinal stenosis and mild cord mass effect at  both levels. No cord signal abnormality. Associated moderate to  severe C4 through C6 neural foraminal stenosis.   ? Patient Stated Goals Get out of pain.   ? Currently in Pain? No/denies   ? ?  ?  ? ?  ? ? ? ? ? [facility] PT Assessment - 01/26/22 0001   ? ?  ? Assessment  ? Medical Diagnosis Cervical spondylosis with radiculopathy.   ? Referring Provider (PT) KAshok PallMD   ? Next MD Visit 01/31/2022   ?  ? Precautions  ? Precautions None   ?  ? Restrictions  ? Weight Bearing Restrictions No   ?  ?  Observation/Other Assessments  ? Focus on Therapeutic Outcomes (FOTO)  28% limitation at 7th visit   ? ?  ?  ? ?  ? ? ? ? ? ? ? ? ? ? ? ? ? ? ? ? [facility] Adult PT Treatment/Exercise - 01/26/22 0001   ? ?  ? Neck Exercises: Machines for Strengthening  ? UBE (Upper Arm Bike) 10 min (forward/backward)   ?  ? Shoulder Exercises: Seated  ? Horizontal ABduction Strengthening;Both;20 reps;Theraband   ? Theraband Level (Shoulder Horizontal ABduction) Level 2 (Red)   ? External Rotation Strengthening;Both;20 reps;Theraband   ? Theraband Level (Shoulder External Rotation) Level 2 (Red)   ? Flexion Strengthening;Both;20 reps;Weights   ? Flexion Weight (lbs) 2   ? Flexion Limitations Hor. abd with flexion red theraband x15 reps   ? Abduction Strengthening;Both;20 reps;Weights   ? ABduction Weight (lbs) 2   ? Diagonals Strengthening;Both;15 reps;Theraband   ? Theraband Level (Shoulder Diagonals) Level 2 (Red)   ?  Other Seated Exercises B shoulder scaption 2# x15 reps   ?  ? Shoulder Exercises: Standing  ? Extension Strengthening;Both;20 reps;Limitations   ? Extension Limitations Blue XTS   ? Row Strengthening;Both;20 reps;Limitations   ? Row Limitations Blkue XTS   ?  ? Shoulder Exercises: ROM/Strengthening  ? UBE (Upper Arm Bike) 90 RPM x8 min   ? Wall Pushups 20 reps   ?  ? Modalities  ? Modalities Electrical Stimulation   ?  ? Electrical Stimulation  ? Electrical Stimulation Location B UT   ? Electrical Stimulation Action Pre-Mod   ? Electrical Stimulation Parameters 80-150 hz x10 min   ? Electrical Stimulation Goals Tone   ? ?  ?  ? ?  ? ? ? ? ? ? ? ? ? ? ? ? ? ? ? PT Long Term Goals - 01/26/22 1106   ? ?  ? PT LONG TERM GOAL #1  ? Title Ind with an HEP.   ? Time 6   ? Period Weeks   ? Status New   ?  ? PT LONG TERM GOAL #2  ? Title Perform ADL's with pain not > 2-3/10.   ? Time 6   ? Period Weeks   ? Status New   ?  ? PT LONG TERM GOAL #3  ? Title Increase active cervical rotation to 65 degrees+ so patient can turn head more easily while driving.   ? Time 6   ? Period Weeks   ? Status New   ? ?  ?  ? ?  ? ? ? ? ? ? ? ? Plan - 01/26/22 1002   ? ? Clinical Impression Statement Patient presented in clinic with reports of no pain. Patient guided through postural strengthening and upper shoulder strengthening. Patient reports feeling 60-70% improvement since PT and now having less limitations with ADLs such as looking up or with prolonged activity. Patient indicated muscle fatigue with all therex. Normal stimulation response noted following removal of the modality.   ? Personal Factors and Comorbidities Comorbidity 1;Other   ? Examination-Activity Limitations Other   ? Examination-Participation Restrictions Other   ? Stability/Clinical Decision Making Stable/Uncomplicated   ? Rehab Potential Good   ? PT Frequency 2x / week   ? PT Duration 6 weeks   ? PT Treatment/Interventions ADLs/Self Care Home  Management;Cryotherapy;Electrical Stimulation;Ultrasound;Moist Heat;Therapeutic activities;Therapeutic exercise;Manual techniques;Patient/family education;Passive range of motion;Dry needling   ? PT Next Visit Plan Postural exercises and try to improve chin tuck and  cervical extension, combo e'stim/US and STW/M.   ? Consulted and Agree with Plan of Care Patient   ? ?  ?  ? ?  ? ? ?Patient will benefit from skilled therapeutic intervention in order to improve the following deficits and impairments:  Decreased activity tolerance, Decreased range of motion, Increased muscle spasms, Postural dysfunction, Pain ? ?Visit Diagnosis: ?Cervicalgia ? ?Abnormal posture ? ? ? ? ?Problem List ?Patient Active Problem List  ? Diagnosis Date Noted  ? History of colonic polyps 12/16/2020  ? CAD (coronary artery disease) 07/03/2019  ? GERD without esophagitis 07/03/2019  ? Prediabetes 07/05/2018  ? Overweight (BMI 25.0-29.9) 05/09/2018  ? Hyperlipidemia LDL goal <130 05/18/2015  ? Stage T1c adenocarcinoma of the prostate with a Gleason's Score of 3+4 and a PSA of 5.73 02/01/2015  ? Peripheral vascular disease, unspecified ([place]) 06/27/2012  ? PAD (peripheral artery disease) ([place]) 02/09/2011  ? Essential hypertension 02/09/2011  ? ? ?APPLEGATE, [place], PT ?01/26/2022, 11:09 AM ? ?[place] ?[facility] ?[place] ?[place], [place], [address] ?Phone: [phone]   Fax:  [phone] ? ?Name: Tommy [name] ?MRN: [id number] ?Date of Birth: 1951/03/17 ? ? ? ?

## 2022-02-10 DIAGNOSIS — M4722 Other spondylosis with radiculopathy, cervical region: Secondary | ICD-10-CM | POA: Diagnosis not present

## 2022-02-10 DIAGNOSIS — Z6827 Body mass index (BMI) 27.0-27.9, adult: Secondary | ICD-10-CM | POA: Diagnosis not present

## 2022-03-16 ENCOUNTER — Ambulatory Visit (HOSPITAL_COMMUNITY)
Admission: RE | Admit: 2022-03-16 | Discharge: 2022-03-16 | Disposition: A | Discharge status: Home / Self Care | Payer: Medicare HMO | Source: Ambulatory Visit | Attending: Surgery | Admitting: Surgery

## 2022-03-16 DIAGNOSIS — I739 Peripheral vascular disease, unspecified: Secondary | ICD-10-CM | POA: Diagnosis not present

## 2022-03-24 ENCOUNTER — Telehealth: Payer: Self-pay | Admitting: Family Medicine

## 2022-03-24 NOTE — Telephone Encounter (Signed)
Advised that it is on pts medication list and he should take '20mg'$  once per day.

## 2022-04-25 ENCOUNTER — Other Ambulatory Visit: Payer: Self-pay | Admitting: Family Medicine

## 2022-04-25 DIAGNOSIS — I10 Essential (primary) hypertension: Secondary | ICD-10-CM

## 2022-05-02 ENCOUNTER — Ambulatory Visit: Payer: Medicare HMO

## 2022-05-23 ENCOUNTER — Ambulatory Visit (INDEPENDENT_AMBULATORY_CARE_PROVIDER_SITE_OTHER): Payer: Medicare HMO | Admitting: Family Medicine

## 2022-05-23 ENCOUNTER — Encounter: Payer: Self-pay | Admitting: Family Medicine

## 2022-05-23 VITALS — BP 140/78 | HR 58 | Temp 98.0°F | Ht 70.0 in | Wt 199.0 lb

## 2022-05-23 DIAGNOSIS — E785 Hyperlipidemia, unspecified: Secondary | ICD-10-CM | POA: Diagnosis not present

## 2022-05-23 DIAGNOSIS — I10 Essential (primary) hypertension: Secondary | ICD-10-CM | POA: Diagnosis not present

## 2022-05-23 DIAGNOSIS — R7303 Prediabetes: Secondary | ICD-10-CM

## 2022-05-23 LAB — BAYER DCA HB A1C WAIVED: HB A1C (BAYER DCA - WAIVED): 6.4 % — ABNORMAL HIGH (ref 4.8–5.6)

## 2022-05-23 NOTE — Progress Notes (Signed)
BP (!) 140/78   Pulse (!) 58   Temp 98 F (36.7 C)   Ht $R'5\' 10"'qQ$  (1.778 m)   Wt 199 lb (90.3 kg)   SpO2 97%   BMI 28.55 kg/m    Subjective:   Patient ID: Tommy [name], male    DOB: 08-06-1951, 71 y.o.   MRN: [id number]  HPI: Tommy [name] is a 71 y.o. male presenting on 05/23/2022 for Medical Management of Chronic Issues, Hypertension, and Hyperlipidemia   HPI Hypertension Patient is currently on amlodipine and hydrochlorothiazide, and their blood pressure today is 140/78. Patient denies any lightheadedness or dizziness. Patient denies headaches, blurred vision, chest pains, shortness of breath, or weakness. Denies any side effects from medication and is content with current medication.   Prediabetes Patient comes in today for recheck of his diabetes. Patient has been currently taking no medication currently says his blood sugars are running high. Patient is not currently on an ACE inhibitor/ARB. Patient has not seen an ophthalmologist this year. Patient denies any issues with their feet. The symptom started onset as an adult hyperlipidemia and PAD and CAD and hypertension ARE RELATED TO DM   Hyperlipidemia and PAD and CAD Patient is coming in for recheck of his hyperlipidemia. The patient is currently taking fenofibrate and Crestor. They deny any issues with myalgias or history of liver damage from it. They deny any focal numbness or weakness or chest pain.   Relevant past medical, surgical, family and social history reviewed and updated as indicated. Interim medical history since our last visit reviewed. Allergies and medications reviewed and updated.  Review of Systems  Constitutional:  Negative for chills and fever.  Eyes:  Negative for visual disturbance.  Respiratory:  Negative for shortness of breath and wheezing.   Cardiovascular:  Negative for chest pain and leg swelling.  Musculoskeletal:  Negative for back pain and gait problem.  Skin:  Negative for rash.  Neurological:   Negative for dizziness, weakness and light-headedness.  All other systems reviewed and are negative.   Per HPI unless specifically indicated above   Allergies as of 05/23/2022       Reactions   Penicillins Swelling   Has patient had a PCN reaction causing immediate rash, facial/tongue/throat swelling, SOB or lightheadedness with hypotension: No Has patient had a PCN reaction causing severe rash involving mucus membranes or skin necrosis: No Has patient had a PCN reaction that required hospitalization No Has patient had a PCN reaction occurring within the last 10 years: No If all of the above answers are "NO", then may proceed with Cephalosporin use.        Medication List        Accurate as of May 23, 2022  8:24 AM. If you have any questions, ask your nurse or doctor.          STOP taking these medications    acetaminophen 500 MG tablet Commonly known as: TYLENOL Stopped by: Fransisca Kaufmann Wasim Hurlbut, MD   esomeprazole 20 MG capsule Commonly known as: NexIUM Stopped by: Worthy Rancher, MD   HYDROcodone-acetaminophen 5-325 MG tablet Commonly known as: NORCO/VICODIN Stopped by: Worthy Rancher, MD   naproxen 500 MG tablet Commonly known as: NAPROSYN Stopped by: Fransisca Kaufmann Arine Foley, MD   sodium chloride 0.65 % Soln nasal spray Commonly known as: OCEAN Stopped by: Worthy Rancher, MD   tizanidine 2 MG capsule Commonly known as: ZANAFLEX Stopped by: Worthy Rancher, MD  TAKE these medications    amLODipine 10 MG tablet Commonly known as: NORVASC Take 1 tablet (10 mg total) by mouth daily.   clopidogrel 75 MG tablet Commonly known as: PLAVIX TAKE 1 TABLET EVERY DAY   fenofibrate 145 MG tablet Commonly known as: TRICOR TAKE 1 TABLET EVERY DAY   hydrochlorothiazide 25 MG tablet Commonly known as: HYDRODIURIL TAKE 1 TABLET EVERY DAY   rosuvastatin 10 MG tablet Commonly known as: CRESTOR TAKE 1 TABLET EVERY EVENING   tamsulosin 0.4 MG  Caps capsule Commonly known as: FLOMAX Take 1 capsule (0.4 mg total) by mouth daily after supper.         Objective:   BP (!) 140/78   Pulse (!) 58   Temp 98 F (36.7 C)   Ht $R'5\' 10"'ID$  (1.778 m)   Wt 199 lb (90.3 kg)   SpO2 97%   BMI 28.55 kg/m   Wt Readings from Last 3 Encounters:  05/23/22 199 lb (90.3 kg)  01/03/22 195 lb 12.8 oz (88.8 kg)  11/22/21 196 lb (88.9 kg)    Physical Exam Vitals and nursing note reviewed.  Constitutional:      General: He is not in acute distress.    Appearance: He is well-developed. He is not diaphoretic.  Eyes:     General: No scleral icterus.    Conjunctiva/sclera: Conjunctivae normal.  Neck:     Thyroid: No thyromegaly.  Cardiovascular:     Rate and Rhythm: Normal rate and regular rhythm.     Heart sounds: Normal heart sounds. No murmur heard. Pulmonary:     Effort: Pulmonary effort is normal. No respiratory distress.     Breath sounds: Normal breath sounds. No wheezing.  Musculoskeletal:        General: No swelling. Normal range of motion.     Cervical back: Neck supple.  Lymphadenopathy:     Cervical: No cervical adenopathy.  Skin:    General: Skin is warm and dry.     Findings: No rash.  Neurological:     Mental Status: He is alert and oriented to person, place, and time.     Coordination: Coordination normal.  Psychiatric:        Behavior: Behavior normal.       Assessment & Plan:   Problem List Items Addressed This Visit       Cardiovascular and Mediastinum   Essential hypertension - Primary   Relevant Orders   CBC with Differential/Platelet   CMP14+EGFR     Other   Hyperlipidemia LDL goal <130   Relevant Orders   CBC with Differential/Platelet   CMP14+EGFR   Lipid panel   Prediabetes   Relevant Orders   Bayer DCA Hb A1c Waived    Continue current medicine, no changes, will do blood work on the way out Follow up plan: Return in about 3 months (around 08/23/2022), or if symptoms worsen or fail to  improve, for Prediabetes hypertension.  Counseling provided for all of the vaccine components Orders Placed This Encounter  Procedures   [facility] [place] Hb A1c Waived   CBC with Differential/Platelet   CMP14+EGFR   Lipid panel    Caryl Pina, MD [facility] 05/23/2022, 8:24 AM

## 2022-05-24 LAB — CBC WITH DIFFERENTIAL/PLATELET
Basophils Absolute: 0 10*3/uL (ref 0.0–0.2)
Basos: 1 %
EOS (ABSOLUTE): 0.1 10*3/uL (ref 0.0–0.4)
Eos: 3 %
Hematocrit: 42.3 % (ref 37.5–51.0)
Hemoglobin: 14.5 g/dL (ref 13.0–17.7)
Immature Grans (Abs): 0 10*3/uL (ref 0.0–0.1)
Immature Granulocytes: 1 %
Lymphocytes Absolute: 1.3 10*3/uL (ref 0.7–3.1)
Lymphs: 35 %
MCH: 28.8 pg (ref 26.6–33.0)
MCHC: 34.3 g/dL (ref 31.5–35.7)
MCV: 84 fL (ref 79–97)
Monocytes Absolute: 0.4 10*3/uL (ref 0.1–0.9)
Monocytes: 10 %
Neutrophils Absolute: 1.8 10*3/uL (ref 1.4–7.0)
Neutrophils: 50 %
Platelets: 177 10*3/uL (ref 150–450)
RBC: 5.03 x10E6/uL (ref 4.14–5.80)
RDW: 12.4 % (ref 11.6–15.4)
WBC: 3.6 10*3/uL (ref 3.4–10.8)

## 2022-05-24 LAB — CMP14+EGFR
ALT: 28 IU/L (ref 0–44)
AST: 22 IU/L (ref 0–40)
Albumin/Globulin Ratio: 2 (ref 1.2–2.2)
Albumin: 4.4 g/dL (ref 3.8–4.8)
Alkaline Phosphatase: 34 IU/L — ABNORMAL LOW (ref 44–121)
BUN/Creatinine Ratio: 20 (ref 10–24)
BUN: 21 mg/dL (ref 8–27)
Bilirubin Total: 0.8 mg/dL (ref 0.0–1.2)
CO2: 24 mmol/L (ref 20–29)
Calcium: 10.7 mg/dL — ABNORMAL HIGH (ref 8.6–10.2)
Chloride: 104 mmol/L (ref 96–106)
Creatinine, Ser: 1.07 mg/dL (ref 0.76–1.27)
Globulin, Total: 2.2 g/dL (ref 1.5–4.5)
Glucose: 134 mg/dL — ABNORMAL HIGH (ref 70–99)
Potassium: 5.7 mmol/L — ABNORMAL HIGH (ref 3.5–5.2)
Sodium: 145 mmol/L — ABNORMAL HIGH (ref 134–144)
Total Protein: 6.6 g/dL (ref 6.0–8.5)
eGFR: 73 mL/min/{1.73_m2} (ref >59)

## 2022-05-24 LAB — LIPID PANEL
Chol/HDL Ratio: 3.1 ratio (ref 0.0–5.0)
Cholesterol, Total: 118 mg/dL (ref 100–199)
HDL: 38 mg/dL — ABNORMAL LOW (ref >39)
LDL Chol Calc (NIH): 65 mg/dL (ref 0–99)
Triglycerides: 76 mg/dL (ref 0–149)
VLDL Cholesterol Cal: 15 mg/dL (ref 5–40)

## 2022-06-01 ENCOUNTER — Telehealth: Payer: Self-pay

## 2022-06-01 ENCOUNTER — Ambulatory Visit (INDEPENDENT_AMBULATORY_CARE_PROVIDER_SITE_OTHER): Payer: Medicare HMO

## 2022-06-01 DIAGNOSIS — Z0000 Encounter for general adult medical examination without abnormal findings: Secondary | ICD-10-CM | POA: Diagnosis not present

## 2022-06-01 NOTE — Progress Notes (Signed)
MEDICARE ANNUAL WELLNESS VISIT  06/01/2022  Telephone Visit Disclaimer This Medicare AWV was conducted by telephone due to national recommendations for restrictions regarding the COVID-19 Pandemic (e.g. social distancing).  I verified, using two identifiers, that I am speaking with Tommy [name] or their authorized healthcare agent. I discussed the limitations, risks, security, and privacy concerns of performing an evaluation and management service by telephone and the potential availability of an in-person appointment in the future. The patient expressed understanding and agreed to proceed.  Location of Patient: Home  Location of Provider (nurse):  [facility]  Subjective:    Tommy [name] is a 71 y.o. male patient of Tommy [name], Tommy Kaufmann, MD who had a Medicare Annual Wellness Visit today via telephone. Tommy [name] is working full time and lives with their spouse. He has three children and three grandchildren.  He reports that he is socially active and does interact with friends/family regularly. He is moderately physically active and enjoys working in his yard.  Patient Care Team: Tommy [name], Tommy Kaufmann, MD as PCP - General (Family Medicine) Tommy Harman, DO as Consulting Physician (Internal Medicine) Tommy Seal, MD as Attending Physician (Urology)     06/01/2022   12:07 PM 12/29/2021   10:11 AM 04/30/2021   10:16 AM 08/04/2016    7:58 AM 08/24/2015   10:51 AM 04/23/2015    8:32 AM 02/02/2015    9:29 AM  Advanced Directives  Does Patient Have a Medical Advance Directive? No Yes Yes No Yes Yes Yes  Type of Comptroller;Living will  Tommy [name];Living will Tommy [name];Living will Tommy [name];Living will  Does patient want to make changes to medical advance directive?      No - Patient declined No - Patient declined  Copy of [place] in Chart?   No - copy requested  Yes No - copy requested No  - copy requested  Would patient like information on creating a medical advance directive? No - Guardian declined   No - patient declined information       Hospital Utilization Over the Past 12 Months: # of hospitalizations or ER visits: 0 # of surgeries: 0  Review of Systems    Patient reports that his overall health is unchanged compared to last year.  History obtained from chart review and the patient  Patient Reported Readings (BP, Pulse, CBG, Weight, etc) none  Pain Assessment Pain : 0-10 Pain Score: 4  Pain Location: Shoulder Pain Orientation: Left Pain Descriptors / Indicators: Aching, Constant, Discomfort Pain Onset: More than a month ago Pain Frequency: Several days a week Pain Relieving Factors: Ibuprofen  Pain Relieving Factors: Ibuprofen  Current Medications & Allergies (verified) Allergies as of 06/01/2022       Reactions   Penicillins Swelling   Has patient had a PCN reaction causing immediate rash, facial/tongue/throat swelling, SOB or lightheadedness with hypotension: No Has patient had a PCN reaction causing severe rash involving mucus membranes or skin necrosis: No Has patient had a PCN reaction that required hospitalization No Has patient had a PCN reaction occurring within the last 10 years: No If all of the above answers are "NO", then may proceed with Cephalosporin use.        Medication List        Accurate as of June 01, 2022 12:10 PM. If you have any questions, ask your nurse or doctor.  amLODipine 10 MG tablet Commonly known as: NORVASC Take 1 tablet (10 mg total) by mouth daily.   clopidogrel 75 MG tablet Commonly known as: PLAVIX TAKE 1 TABLET EVERY DAY   fenofibrate 145 MG tablet Commonly known as: TRICOR TAKE 1 TABLET EVERY DAY   hydrochlorothiazide 25 MG tablet Commonly known as: HYDRODIURIL TAKE 1 TABLET EVERY DAY   rosuvastatin 10 MG tablet Commonly known as: CRESTOR TAKE 1 TABLET EVERY EVENING    tamsulosin 0.4 MG Caps capsule Commonly known as: FLOMAX Take 1 capsule (0.4 mg total) by mouth daily after supper.        History (reviewed): Past Medical History:  Diagnosis Date   BPH with obstruction/lower urinary tract symptoms    hyperplasia   ED (erectile dysfunction)    arterial insuff.   History of DVT of lower extremity    History of gastric ulcer    Hyperlipidemia    Hypertension    Peripheral vascular disease ([place])    Prostate cancer ([place]) DX 01/01/15--  urologist-  dr Tommy [name]   Stage T1c, Gleason 3+4, PSA 5.73, vol 44m   Wears glasses    Past Surgical History:  Procedure Laterality Date   FEMORAL ARTERY STENT Left 07-22-2008  &  11-16-2010   left SFA//   In-Stent restenosis   ORIF LEFT INDEX FINGER FX  09-28-2006   PROSTATE BIOPSY  01/01/15   PROSTATE SURGERY  July 2016   Seed Implant   RADIOACTIVE SEED IMPLANT N/A 04/23/2015   Procedure: RADIOACTIVE SEED IMPLANT/BRACHYTHERAPY IMPLANT;  Surgeon: JIrine Seal MD;  [facility]  Service: Urology;  Laterality: N/A;   TONSILLECTOMY  as child   Family History  Problem Relation Age of Onset   Heart disease Mother    Cancer Mother    Deep vein thrombosis Mother    Diabetes Mother    Hyperlipidemia Mother    Hypertension Mother    Heart attack Mother    Peripheral vascular disease Mother    Cancer Father    Colon cancer Neg Hx    Social History   Socioeconomic History   Marital status: Married    Spouse name: Tommy [name]  Number of children: 3   Years of education: Not on file   Highest education level: Not on file  Occupational History    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  Tobacco Use   Smoking status: Former    Packs/day: 1.00    Years: 15.00    Total pack years: 15.00    Types: Cigarettes    Start date: 09/09/2010    Quit date: 04/21/2011    Years since quitting: 11.1   Smokeless tobacco: Current    Types: Snuff   Tobacco comments:     occasional  someday dips tobacco  Vaping Use   Vaping Use: Never used  Substance and Sexual Activity   Alcohol use: No    Alcohol/week: 0.0 standard drinks of alcohol   Drug use: No   Sexual activity: Not on file  Other Topics Concern   Not on file  Social History Narrative   2 daughters and one son - all live nearby   Social Determinants of Health   Financial Resource Strain: Low Risk  (04/30/2021)   Overall Financial Resource Strain (CARDIA)    Difficulty of Paying Living Expenses: Not hard at all  Food Insecurity: No Food Insecurity (04/30/2021)   Hunger Vital Sign    Worried About Running Out  of Food in the Last Year: Never true    [place] in the Last Year: Never true  Transportation Needs: No Transportation Needs (04/30/2021)   PRAPARE - Hydrologist (Medical): No    Lack of Transportation (Non-Medical): No  Physical Activity: Sufficiently Active (04/30/2021)   Exercise Vital Sign    Days of Exercise per Week: 7 days    Minutes of Exercise per Session: 40 min  Stress: No Stress Concern Present (04/30/2021)   [place]    Feeling of Stress : Not at all  Social Connections: [place] (04/30/2021)   Social Connection and Isolation Panel [NHANES]    Frequency of Communication with Friends and Family: More than three times a week    Frequency of Social Gatherings with Friends and Family: More than three times a week    Attends Religious Services: 1 to 4 times per year    Active Member of [employer] or Organizations: Yes    Attends Archivist Meetings: 1 to 4 times per year    Marital Status: Married    Activities of Daily Living    06/01/2022   12:08 PM  In your present state of health, do you have any difficulty performing the following activities:  Hearing? 0  Vision? 0  Difficulty concentrating or making decisions? 0  Walking or climbing stairs? 0   Dressing or bathing? 0  Doing errands, shopping? 0  Preparing Food and eating ? N  Using the Toilet? N  In the past six months, have you accidently leaked urine? N  Do you have problems with loss of bowel control? N  Managing your Medications? N  Managing your Finances? N  Housekeeping or managing your Housekeeping? N    Patient Education/ Literacy How often do you need to have someone help you when you read instructions, pamphlets, or other written materials from your doctor or pharmacy?: 1 - Never  Exercise Current Exercise Habits: The patient does not participate in regular exercise at present, Exercise limited by: orthopedic condition(s)  Diet Patient reports consuming 2 meals a day and 1 snack(s) a day Patient reports that his primary diet is: Regular Patient reports that she does have regular access to food.   Depression Screen    06/01/2022   12:09 PM 05/23/2022    8:16 AM 11/22/2021    8:04 AM 09/14/2021    8:29 AM 06/02/2021    2:52 PM 05/05/2021    8:06 AM 04/30/2021   10:08 AM  PHQ 2/9 Scores  PHQ - 2 Score 0 0 0 0 0 0 0  PHQ- 9 Score  1  0  0 2     Fall Risk    06/01/2022   12:10 PM 05/23/2022    8:16 AM 11/22/2021    8:04 AM 09/14/2021    8:30 AM 06/02/2021    2:52 PM  Fall Risk   Falls in the past year? 0 0 0 1 0  Number falls in past yr:    0 0  Injury with Fall?    1 0  Comment    knee pain   Risk for fall due to :     No Fall Risks  Follow up Falls evaluation completed    Falls prevention discussed     Objective:  Tommy [name] seemed alert and oriented and he participated appropriately during our telephone visit.  Blood Pressure  Weight BMI  BP Readings from Last 3 Encounters:  05/23/22 (!) 140/78  01/03/22 130/70  11/22/21 (!) 152/89   Wt Readings from Last 3 Encounters:  05/23/22 199 lb (90.3 kg)  01/03/22 195 lb 12.8 oz (88.8 kg)  11/22/21 196 lb (88.9 kg)   BMI Readings from Last 1 Encounters:  05/23/22 28.55 kg/m    *Unable to obtain  current vital signs, weight, and BMI due to telephone visit type  Hearing/Vision  Armen did not seem to have difficulty with hearing/understanding during the telephone conversation Reports that he has not had a formal eye exam by an eye care professional within the past year Reports that he has not had a formal hearing evaluation within the past year *Unable to fully assess hearing and vision during telephone visit type  Cognitive Function:    06/01/2022   12:08 PM 04/30/2021   10:16 AM  6CIT Screen  What Year? 0 points 0 points  What month? 0 points 0 points  What time? 0 points 0 points  Count back from 20 0 points 0 points  Months in reverse 0 points 0 points  Repeat phrase 0 points 0 points  Total Score 0 points 0 points   (Normal:0-7, Significant for Dysfunction: >8)  Normal Cognitive Function Screening: Yes   Immunization & Health [name] Record Immunization History  Administered Date(s) Administered   Fluad Quad(high Dose 65+) 07/03/2019, 07/14/2021   Influenza Split 07/13/2013   Influenza Whole 07/12/2010   Influenza, High Dose Seasonal PF 07/05/2018   Influenza,inj,Quad PF,6+ Mos 07/23/2014, 07/20/2015, 07/07/2016, 07/26/2017   Moderna Covid-19 Vaccine Bivalent Booster 70yr & up 08/10/2021   Moderna Sars-Covid-2 Vaccination 12/05/2019, 01/03/2020, 10/15/2020   Pneumococcal Conjugate-13 07/07/2016, 01/22/2018   Pneumococcal Polysaccharide-23 10/11/2001, 10/11/2001, 10/11/2001, 07/03/2019   Tdap 08/04/2016   Zoster Recombinat (Shingrix) 11/22/2021   Zoster, Live 08/08/2013    Health [name]  Topic Date Due   COLONOSCOPY (Pts 45-447yrInsurance coverage will need to be confirmed)  07/11/2015   COVID-19 Vaccine (5 - Moderna risk series) 10/05/2021   Zoster Vaccines- Shingrix (2 of 2) 01/17/2022   INFLUENZA VACCINE  01/08/2023 (Originally 05/10/2022)   TETANUS/TDAP  08/04/2026   Pneumonia Vaccine 6556Years old  Completed   Hepatitis C Screening  Completed    HPV VACCINES  Aged Out       Assessment  This is a routine wellness examination for Tommy [name]: Due or Overdue Health [name] Due  Topic Date Due   COLONOSCOPY (Pts [id number] coverage will need to be confirmed)  07/11/2015   COVID-19 Vaccine (5 - Moderna risk series) 10/05/2021   Zoster Vaccines- Shingrix (2 of 2) 01/17/2022    [place] not need a referral for Community Assistance: Care Management:   no Social Work:    no Prescription Assistance:  no Nutrition/Diabetes Education:  no   Plan:  Personalized Goals  Goals Addressed             This Visit's Progress    Patient Stated       06/01/2022 AWV Goal: Fall Prevention  Over the next year, patient will decrease their risk for falls by: Using assistive devices, such as a cane or walker, as needed Identifying fall risks within their home and correcting them by: Removing throw rugs Adding handrails to stairs or ramps Removing clutter and keeping a clear pathway throughout the home Increasing light, especially at night Adding shower handles/bars Raising toilet seat Identifying potential  personal risk factors for falls: Medication side effects Incontinence/urgency Vestibular dysfunction Hearing loss Musculoskeletal disorders Neurological disorders Orthostatic hypotension         Personalized Health [name] & Screening Recommendations  Colorectal cancer screening Shingrix  Lung Cancer Screening Recommended: no (Low Dose CT Chest recommended if Age 30-80 years, 30 pack-year currently smoking OR have quit w/in past 15 years) Hepatitis C Screening recommended: no HIV Screening recommended: no  Advanced Directives: Written information was not prepared per patient's request.  Referrals & Orders No orders of the defined types were placed in this encounter.   Follow-up Plan Follow-up with Tommy [name], Tommy Kaufmann, MD as planned    I have personally reviewed  and noted the following in the patient's chart:   Medical and social history Use of alcohol, tobacco or illicit drugs  Current medications and supplements Functional ability and status Nutritional status Physical activity Advanced directives List of other physicians Hospitalizations, surgeries, and ER visits in previous 12 months Vitals Screenings to include cognitive, depression, and falls Referrals and appointments  In addition, I have reviewed and discussed with Tommy [name] certain preventive protocols, quality metrics, and best practice recommendations. A written personalized care plan for preventive services as well as general preventive health recommendations is available and can be mailed to the patient at his request.      Burnadette Pop  06/01/2022   Patient declined after visit summary

## 2022-06-01 NOTE — Telephone Encounter (Signed)
Patient calling to check on lab results and instructions, please advise.

## 2022-06-05 ENCOUNTER — Encounter: Payer: Self-pay | Admitting: Family Medicine

## 2022-06-05 DIAGNOSIS — E875 Hyperkalemia: Secondary | ICD-10-CM

## 2022-06-15 ENCOUNTER — Inpatient Hospital Stay (INDEPENDENT_AMBULATORY_CARE_PROVIDER_SITE_OTHER): Payer: Medicare HMO

## 2022-06-15 ENCOUNTER — Ambulatory Visit (INDEPENDENT_AMBULATORY_CARE_PROVIDER_SITE_OTHER): Payer: Medicare HMO | Admitting: Family Medicine

## 2022-06-15 ENCOUNTER — Encounter: Payer: Self-pay | Admitting: Family Medicine

## 2022-06-15 ENCOUNTER — Other Ambulatory Visit: Payer: Medicare HMO

## 2022-06-15 VITALS — Ht 70.0 in | Wt 189.2 lb

## 2022-06-15 DIAGNOSIS — R002 Palpitations: Secondary | ICD-10-CM

## 2022-06-15 DIAGNOSIS — R079 Chest pain, unspecified: Secondary | ICD-10-CM

## 2022-06-15 DIAGNOSIS — R42 Dizziness and giddiness: Secondary | ICD-10-CM | POA: Diagnosis not present

## 2022-06-15 DIAGNOSIS — E875 Hyperkalemia: Secondary | ICD-10-CM | POA: Diagnosis not present

## 2022-06-15 LAB — BMP8+EGFR
BUN/Creatinine Ratio: 16 (ref 10–24)
BUN: 19 mg/dL (ref 8–27)
CO2: 28 mmol/L (ref 20–29)
Calcium: 10.8 mg/dL — ABNORMAL HIGH (ref 8.6–10.2)
Chloride: 104 mmol/L (ref 96–106)
Creatinine, Ser: 1.17 mg/dL (ref 0.76–1.27)
Glucose: 105 mg/dL — ABNORMAL HIGH (ref 70–99)
Potassium: 5.2 mmol/L (ref 3.5–5.2)
Sodium: 144 mmol/L (ref 134–144)
eGFR: 66 mL/min/{1.73_m2} (ref >59)

## 2022-06-15 NOTE — Patient Instructions (Signed)
Nonspecific Chest Pain, Adult Chest pain is an uncomfortable, tight, or painful feeling in the chest. The pain can feel like a crushing, aching, or squeezing pressure. A person can feel a burning or tingling sensation. Chest pain can also be felt in your back, neck, jaw, shoulder, or arm. This pain can be worse when you move, sneeze, or take a deep breath. Chest pain can be caused by a condition that is life-threatening. This must be treated right away. It can also be caused by something that is not life-threatening. If you have chest pain, it can be hard to know the difference, so it is important to get help right away to make sure that you do not have a serious condition. Some life-threatening causes of chest pain include: Heart attack. A tear in the body's main blood vessel (aortic dissection). Inflammation around your heart (pericarditis). A problem in the lungs, such as a blood clot (pulmonary embolism) or a collapsed lung (pneumothorax). Some non life-threatening causes of chest pain include: Heartburn. Anxiety or stress. Damage to the bones, muscles, and cartilage that make up your chest wall. Pneumonia or bronchitis. Shingles infection (varicella-zoster virus). Your chest pain may come and go. It may also be constant. Your health care provider will do tests and other studies to find the cause of your pain. Treatment will depend on the cause of your chest pain. Follow these instructions at home: Medicines Take over-the-counter and prescription medicines only as told by your health care provider. If you were prescribed an antibiotic medicine, take it as told by your health care provider. Do not stop taking the antibiotic even if you start to feel better. Activity Avoid any activities that cause chest pain. Do not lift anything that is heavier than 10 lb (4.5 kg), or the limit that you are told, until your health care provider says that it is safe. Rest as directed by your health care  provider. Return to your normal activities only as told by your health care provider. Ask your health care provider what activities are safe for you. Lifestyle     Do not use any products that contain nicotine or tobacco, such as cigarettes, e-cigarettes, and chewing tobacco. If you need help quitting, ask your health care provider. Do not drink alcohol. Make healthy lifestyle changes as recommended. These may include: Getting regular exercise. Ask your health care provider to suggest some exercises that are safe for you. Eating a heart-healthy diet. This includes plenty of fresh fruits and vegetables, whole grains, low-fat (lean) protein, and low-fat dairy products. A dietitian can help you find healthy eating options. Maintaining a healthy weight. Managing any other health conditions you may have, such as high blood pressure (hypertension) or diabetes. Reducing stress, such as with yoga or relaxation techniques. General instructions Pay attention to any changes in your symptoms. It is up to you to get the results of any tests that were done. Ask your health care provider, or the department that is doing the tests, when your results will be ready. Keep all follow-up visits as told by your health care provider. This is important. You may be asked to go for further testing if your chest pain does not go away. Contact a health care provider if: Your chest pain does not go away. You feel depressed. You have a fever. You notice changes in your symptoms or develop new symptoms. Get help right away if: Your chest pain gets worse. You have a cough that gets worse, or you   cough up blood. You have severe pain in your abdomen. You faint. You have sudden, unexplained chest discomfort. You have sudden, unexplained discomfort in your arms, back, neck, or jaw. You have shortness of breath at any time. You suddenly start to sweat, or your skin gets clammy. You feel nausea or you vomit. You  suddenly feel lightheaded or dizzy. You have severe weakness, or unexplained weakness or fatigue. Your heart begins to beat quickly, or it feels like it is skipping beats. These symptoms may represent a serious problem that is an emergency. Do not wait to see if the symptoms will go away. Get medical help right away. Call your local emergency services (911 in the [place] Do not drive yourself to the hospital. Summary Chest pain can be caused by a condition that is serious and requires urgent treatment. It may also be caused by something that is not life-threatening. Your health care provider may do lab tests and other studies to find the cause of your pain. Follow your health care provider's instructions on taking medicines, making lifestyle changes, and getting emergency treatment if symptoms become worse. Keep all follow-up visits as told by your health care provider. This includes visits for any further testing if your chest pain does not go away. This information is not intended to replace advice given to you by your health care provider. Make sure you discuss any questions you have with your health care provider. Document Revised: 12/10/2020 Document Reviewed: 12/10/2020 Elsevier Patient Education  2023 Elsevier Inc.  

## 2022-06-15 NOTE — Progress Notes (Signed)
Acute Office Visit  Subjective:     Patient ID: Tommy [name], male    DOB: 02-03-1951, 71 y.o.   MRN: [id number]  Chief Complaint  Patient presents with   Dizziness    HPI Patient is in today for dizziness for the last 2 days. He reports that this occurs randomly and lasts for about 10 minutes at a time.. It feels like a feeling like he may pass out. He also reports chest pain with the occurs with some shortness of breath. The episodes have been occurring a few times per day for the last few days. This has occurred with activity and also while resting. They have lasted for a few minutes at a time and have resolved with rest or deep breathing. Reports last episodes occurred yesterday. He feels better overall today. Denies current symptoms. He wears an apple watch and has been notified a few times that he was in a fib. This last occurred yesterday. When this occurred it reports that his HR was 153 and he felt lightheaded with chest pain. This resolved after a few minutes. His potassium was 5.7 on his labs on 05/26/22. He had this repeated today prior to his visit. He was confused on diet recommendations and he has been eating potatoes and banana sandwiches.    Review of Systems  Constitutional:  Positive for malaise/fatigue. Negative for chills, diaphoresis and fever.  Eyes:  Negative for blurred vision and double vision.  Cardiovascular:  Positive for chest pain and palpitations. Negative for orthopnea, claudication, leg swelling and PND.  Gastrointestinal:  Negative for nausea and vomiting.  Neurological:  Positive for dizziness. Negative for tingling, tremors, sensory change, speech change, focal weakness, seizures, loss of consciousness and weakness.        Objective:    Ht '5\' 10"'$  (1.778 m)   Wt 189 lb 4 oz (85.8 kg)   BMI 27.15 kg/m    Physical Exam Vitals and nursing note reviewed.  Constitutional:      General: He is not in acute distress.    Appearance: He is not  ill-appearing, toxic-appearing or diaphoretic.  HENT:     Nose: Nose normal.     Mouth/Throat:     Mouth: Mucous membranes are moist.     Pharynx: Oropharynx is clear.  Eyes:     General:        Right eye: No discharge.        Left eye: No discharge.     Extraocular Movements: Extraocular movements intact.     Conjunctiva/sclera: Conjunctivae normal.     Pupils: Pupils are equal, round, and reactive to light.  Neck:     Vascular: No carotid bruit.  Cardiovascular:     Rate and Rhythm: Rhythm irregularly irregular. No extrasystoles are present.    Heart sounds: Normal heart sounds. No murmur heard.    No friction rub. No gallop. No S3 or S4 sounds.  Pulmonary:     Effort: Pulmonary effort is normal. No respiratory distress.     Breath sounds: Normal breath sounds. No wheezing, rhonchi or rales.  Chest:     Chest wall: No tenderness.  Abdominal:     General: Bowel sounds are normal. There is no distension.     Palpations: Abdomen is soft.     Tenderness: There is no abdominal tenderness. There is no right CVA tenderness, left CVA tenderness, guarding or rebound.  Musculoskeletal:     Cervical back: Neck supple.  Right lower leg: No edema.     Left lower leg: No edema.  Skin:    General: Skin is warm and dry.  Neurological:     General: No focal deficit present.     Mental Status: He is alert and oriented to person, place, and time.     Cranial Nerves: No cranial nerve deficit.     Motor: No weakness.     Gait: Gait normal.  Psychiatric:        Mood and Affect: Mood normal.        Behavior: Behavior normal.     No results found for any visits on 06/15/22.      Assessment & Plan:   Tommy [name] was seen today for dizziness.  Diagnoses and all orders for this visit:  Dizziness Chest pain, unspecified type Palpitations Serum potassium elevated Reports episodes of dizziness with chest pain over the last 2 days. No episodes since yesterday. EKG with NSR today. RR is  irregularly irregular today on exam, currently asymptomatic. Negative orthostatics today. Of note his potassium was 5.7 on 05/26/22. He unfortunately has increase potassium in his diet. PCP requested recheck of this which patient had done prior to his appt today. Discussed possibility of arrhthymia related to elevated potassium vs other cause of arrhythmia. Discussed case with PCP. PCP recommend zio monitor for now. Discussed when to seek emergency care/urgent evaluation while zio is pending.  Will route to PCP for review. Will notify patient of zio results when available.  -     EKG 12-Lead -     LONG TERM MONITOR (3-14 DAYS); Future  The patient indicates understanding of these issues and agrees with the plan.  Gwenlyn Perking, FNP

## 2022-07-04 DIAGNOSIS — R002 Palpitations: Secondary | ICD-10-CM | POA: Diagnosis not present

## 2022-07-04 DIAGNOSIS — R42 Dizziness and giddiness: Secondary | ICD-10-CM | POA: Diagnosis not present

## 2022-07-07 ENCOUNTER — Encounter: Payer: Self-pay | Admitting: Family Medicine

## 2022-07-19 ENCOUNTER — Ambulatory Visit (INDEPENDENT_AMBULATORY_CARE_PROVIDER_SITE_OTHER): Payer: Medicare HMO

## 2022-07-19 DIAGNOSIS — Z23 Encounter for immunization: Secondary | ICD-10-CM | POA: Diagnosis not present

## 2022-08-25 ENCOUNTER — Encounter: Payer: Self-pay | Admitting: Family Medicine

## 2022-08-25 ENCOUNTER — Ambulatory Visit (INDEPENDENT_AMBULATORY_CARE_PROVIDER_SITE_OTHER): Payer: Medicare HMO | Admitting: Family Medicine

## 2022-08-25 VITALS — BP 160/90 | HR 43 | Temp 98.0°F | Ht 70.0 in | Wt 191.0 lb

## 2022-08-25 DIAGNOSIS — I10 Essential (primary) hypertension: Secondary | ICD-10-CM

## 2022-08-25 DIAGNOSIS — R0602 Shortness of breath: Secondary | ICD-10-CM | POA: Diagnosis not present

## 2022-08-25 DIAGNOSIS — R7303 Prediabetes: Secondary | ICD-10-CM

## 2022-08-25 DIAGNOSIS — R42 Dizziness and giddiness: Secondary | ICD-10-CM | POA: Diagnosis not present

## 2022-08-25 DIAGNOSIS — I4891 Unspecified atrial fibrillation: Secondary | ICD-10-CM

## 2022-08-25 DIAGNOSIS — E785 Hyperlipidemia, unspecified: Secondary | ICD-10-CM

## 2022-08-25 LAB — CMP14+EGFR
ALT: 22 IU/L (ref 0–44)
AST: 20 IU/L (ref 0–40)
Albumin/Globulin Ratio: 1.8 (ref 1.2–2.2)
Albumin: 4.6 g/dL (ref 3.8–4.8)
Alkaline Phosphatase: 39 IU/L — ABNORMAL LOW (ref 44–121)
BUN/Creatinine Ratio: 22 (ref 10–24)
BUN: 25 mg/dL (ref 8–27)
Bilirubin Total: 0.9 mg/dL (ref 0.0–1.2)
CO2: 28 mmol/L (ref 20–29)
Calcium: 10.8 mg/dL — ABNORMAL HIGH (ref 8.6–10.2)
Chloride: 101 mmol/L (ref 96–106)
Creatinine, Ser: 1.15 mg/dL (ref 0.76–1.27)
Globulin, Total: 2.5 g/dL (ref 1.5–4.5)
Glucose: 134 mg/dL — ABNORMAL HIGH (ref 70–99)
Potassium: 4.4 mmol/L (ref 3.5–5.2)
Sodium: 143 mmol/L (ref 134–144)
Total Protein: 7.1 g/dL (ref 6.0–8.5)
eGFR: 67 mL/min/{1.73_m2} (ref >59)

## 2022-08-25 LAB — CBC WITH DIFFERENTIAL/PLATELET
Basophils Absolute: 0 10*3/uL (ref 0.0–0.2)
Basos: 1 %
EOS (ABSOLUTE): 0.1 10*3/uL (ref 0.0–0.4)
Eos: 3 %
Hematocrit: 46.6 % (ref 37.5–51.0)
Hemoglobin: 15.5 g/dL (ref 13.0–17.7)
Immature Grans (Abs): 0 10*3/uL (ref 0.0–0.1)
Immature Granulocytes: 1 %
Lymphocytes Absolute: 1.6 10*3/uL (ref 0.7–3.1)
Lymphs: 37 %
MCH: 28.4 pg (ref 26.6–33.0)
MCHC: 33.3 g/dL (ref 31.5–35.7)
MCV: 85 fL (ref 79–97)
Monocytes Absolute: 0.4 10*3/uL (ref 0.1–0.9)
Monocytes: 8 %
Neutrophils Absolute: 2.2 10*3/uL (ref 1.4–7.0)
Neutrophils: 50 %
Platelets: 200 10*3/uL (ref 150–450)
RBC: 5.46 x10E6/uL (ref 4.14–5.80)
RDW: 12 % (ref 11.6–15.4)
WBC: 4.3 10*3/uL (ref 3.4–10.8)

## 2022-08-25 LAB — LIPID PANEL
Chol/HDL Ratio: 4.3 ratio (ref 0.0–5.0)
Cholesterol, Total: 153 mg/dL (ref 100–199)
HDL: 36 mg/dL — ABNORMAL LOW (ref >39)
LDL Chol Calc (NIH): 95 mg/dL (ref 0–99)
Triglycerides: 121 mg/dL (ref 0–149)
VLDL Cholesterol Cal: 22 mg/dL (ref 5–40)

## 2022-08-25 LAB — BAYER DCA HB A1C WAIVED: HB A1C (BAYER DCA - WAIVED): 5.8 % — ABNORMAL HIGH (ref 4.8–5.6)

## 2022-08-25 MED ORDER — METOPROLOL SUCCINATE ER 25 MG PO TB24: 25.0000 mg | ORAL_TABLET | Freq: Every day | ORAL | 3 refills | Status: DC

## 2022-08-25 NOTE — Progress Notes (Signed)
BP (!) 160/90   Pulse (!) 43   Temp 98 F (36.7 C)   Ht 5' 10" (1.778 m)   Wt 191 lb (86.6 kg)   SpO2 96%   BMI 27.41 kg/m    Subjective:   Patient ID: Tommy [name], male    DOB: January 09, 1951, 71 y.o.   MRN: [id number]  HPI: Tommy [name] is a 71 y.o. male presenting on 08/25/2022 for Medical Management of Chronic Issues, Hypertension, Prediabetes, Irregular Heart Beat (Recent Zio patch, needs results), Dizziness, and Shortness of Breath   Prediabetes Patient comes in today for recheck of his diabetes. Patient has been currently taking no medicine currently, diet control. Patient is not currently on an ACE inhibitor/ARB. Patient has not seen an ophthalmologist this year. Patient denies any issues with their feet. The symptom started onset as an adult hypertension and hyperlipidemia ARE RELATED TO DM   Hypertension Patient is currently on amlodipine and metoprolol and hydrochlorothiazide, and their blood pressure today is 160/90. Patient denies any lightheadedness or dizziness. Patient denies headaches, blurred vision, chest pains, shortness of breath, or weakness. Denies any side effects from medication and is content with current medication.   Hyperlipidemia Patient is coming in for recheck of his hyperlipidemia. The patient is currently taking Crestor. They deny any issues with myalgias or history of liver damage from it. They deny any focal numbness or weakness or chest pain.   Patient continues to be compliant with his medications. Does not report chest pain but does endorse shortness of breath, feeling like his "head is swimming," feeling off-balance, and decreased energy. The patient does report occasional lightheadedness. Denies changes to vision, headaches, numbness, or tingling in lower extremities. Wore a Zio patch last month but was not updated on the results of this.   Relevant past medical, surgical, family and social history reviewed and updated as indicated. Interim medical  history since our last visit reviewed. Allergies and medications reviewed and updated.  Review of Systems  Constitutional:  Positive for activity change and fatigue. Negative for appetite change, chills, diaphoresis, fever and unexpected weight change.  HENT:  Negative for congestion.   Respiratory:  Positive for shortness of breath. Negative for apnea, cough, choking, chest tightness and wheezing.   Cardiovascular:  Negative for chest pain, palpitations and leg swelling.  Endocrine: Negative for cold intolerance and heat intolerance.  Skin:  Negative for color change and pallor.    Per HPI unless specifically indicated above  ASSESSMENT AND PLAN:  Patient is in persistent atrial fibrillation, as evidenced by the results of his Zio patch monitoring. A Fib noted 3% of the time with longest run lasting about 1.5 hours and detected with symptomatic patient events. There were also 12 pauses with longest lasting 4.5 seconds.   Patient's symptoms of shortness of breath, "head swimming," decreased energy, and dizziness are consistent with a diagnosis of atrial fibrillation. Discussed findings with patient and discussed urgent referral to cardiology for further workup and possible ablation. Will add a low-dose metoprolol 25 mg to assist in maintenance of sinus rhythm and controlling ventricular rate. Patient has a CHA2DS2-VASc Score of 4 (see below for calculation input). Will defer to cardiology for further anticoagulation, as patient is currently on Plavix due to femoral artery stent. Advised patient to call the office if any of his symptoms worsen.   CHA2DS2-VASc Score = 4 The patient's score is based upon:  RESULT SUMMARY: 4 points Stroke risk was 4.8% per year in >90,000  patients (the [place] Atrial Fibrillation Cohort Study) and 6.7% risk of stroke/TIA/systemic embolism.  One recommendation suggests a 0 score for men or 1 score for women (no clinical risk factors) is "low" risk and may not  require anticoagulation; a 1 score for men or 2 score for women is "low-moderate" risk and should consider antiplatelet or anticoagulation; and a score ?2 for men or ?3 for women is "moderate-high" risk and should otherwise be an anticoagulation candidate.  INPUTS: Age --> 1 = 65-74 Sex --> 0 = Male CHF history --> 0 = No Hypertension history --> 1 = Yes Stroke/TIA/thromboembolism history --> 0 = No Vascular disease history (prior MI, peripheral artery disease, or aortic plaque) --> 1 = Yes Diabetes history --> 1 = Yes  Allergies as of 08/25/2022       Reactions   Penicillins Swelling   Has patient had a PCN reaction causing immediate rash, facial/tongue/throat swelling, SOB or lightheadedness with hypotension: No Has patient had a PCN reaction causing severe rash involving mucus membranes or skin necrosis: No Has patient had a PCN reaction that required hospitalization No Has patient had a PCN reaction occurring within the last 10 years: No If all of the above answers are "NO", then may proceed with Cephalosporin use.        Medication List        Accurate as of August 25, 2022  3:30 PM. If you have any questions, ask your nurse or doctor.          amLODipine 10 MG tablet Commonly known as: NORVASC Take 1 tablet (10 mg total) by mouth daily.   aspirin EC 325 MG tablet Take 650 mg by mouth at bedtime as needed for mild pain.   clopidogrel 75 MG tablet Commonly known as: PLAVIX TAKE 1 TABLET EVERY DAY   fenofibrate 145 MG tablet Commonly known as: TRICOR TAKE 1 TABLET EVERY DAY   hydrochlorothiazide 25 MG tablet Commonly known as: HYDRODIURIL TAKE 1 TABLET EVERY DAY   metoprolol succinate 25 MG 24 hr tablet Commonly known as: TOPROL-XL Take 1 tablet (25 mg total) by mouth daily. Started by: Worthy Rancher, MD   rosuvastatin 10 MG tablet Commonly known as: CRESTOR TAKE 1 TABLET EVERY EVENING   tamsulosin 0.4 MG Caps capsule Commonly known as:  FLOMAX Take 1 capsule (0.4 mg total) by mouth daily after supper.         Objective:   BP (!) 160/90   Pulse (!) 43   Temp 98 F (36.7 C)   Ht 5' 10" (1.778 m)   Wt 191 lb (86.6 kg)   SpO2 96%   BMI 27.41 kg/m   Wt Readings from Last 3 Encounters:  08/25/22 191 lb (86.6 kg)  06/15/22 189 lb 4 oz (85.8 kg)  05/23/22 199 lb (90.3 kg)    Physical Exam  Physical Examination:  General: Awake, alert, well nourished, No acute distress Cardio: irregularly regular rate and rhythm, S1S2 heard, no murmurs appreciated Pulm: clear to auscultation bilaterally, no wheezes, rhonchi or rales; normal work of breathing on room air GI: soft, non-tender, non-distended, bowel sounds present x4, no hepatomegaly, no splenomegaly, no masses Extremities: warm, well perfused, No edema, cyanosis or clubbing; +1 pulses bilaterally Skin: dry; intact; no rashes or lesions     Assessment & Plan:   Problem List Items Addressed This Visit       Cardiovascular and Mediastinum   Essential hypertension   Relevant Medications   aspirin EC  325 MG tablet   metoprolol succinate (TOPROL-XL) 25 MG 24 hr tablet   Other Relevant Orders   CBC with Differential/Platelet   CMP14+EGFR     Other   Hyperlipidemia LDL goal <130   Relevant Medications   aspirin EC 325 MG tablet   metoprolol succinate (TOPROL-XL) 25 MG 24 hr tablet   Other Relevant Orders   Lipid panel   Prediabetes   Relevant Orders   Bayer DCA Hb A1c Waived (Completed)   Other Visit Diagnoses     New onset a-fib (HCC)    -  Primary   Relevant Medications   aspirin EC 325 MG tablet   metoprolol succinate (TOPROL-XL) 25 MG 24 hr tablet   Other Relevant Orders   Ambulatory referral to Cardiology   Shortness of breath       Dizziness           We will start patient on metoprolol to help with blood pressure and his heart rate, will refer to cardiology and placed urgent referral.  Did not start anticoagulation today, will will  defer to cardiology due to already being on Plavix Follow up plan: Return in about 3 months (around 11/25/2022), or if symptoms worsen or fail to improve, for Prediabetes hypertension and hyperlipidemia and afib.  Counseling provided for all of the vaccine components Orders Placed This Encounter  Procedures   Bayer [place] Hb A1c Waived   CBC with Differential/Platelet   CMP14+EGFR   Lipid panel   Ambulatory referral to Cardiology    Stephani Police, MS3 08/25/2022, 3:30 PM  Patient seen and examined with medical student.  Agree with assessment and plan above.  Likely dizziness and other symptoms are related to the paroxysmal A-fib. Caryl Pina, MD [facility] 08/25/2022, 3:30 PM

## 2022-09-14 ENCOUNTER — Ambulatory Visit (HOSPITAL_COMMUNITY)
Admission: RE | Admit: 2022-09-14 | Discharge: 2022-09-14 | Disposition: A | Discharge status: Home / Self Care | Payer: Medicare HMO | Source: Ambulatory Visit | Attending: Physician Assistant | Admitting: Physician Assistant

## 2022-09-14 ENCOUNTER — Encounter (HOSPITAL_COMMUNITY): Payer: Self-pay | Admitting: Physician Assistant

## 2022-09-14 ENCOUNTER — Encounter: Payer: Self-pay | Admitting: Family Medicine

## 2022-09-14 VITALS — BP 148/84 | HR 71 | Ht 70.0 in | Wt 194.0 lb

## 2022-09-14 DIAGNOSIS — I10 Essential (primary) hypertension: Secondary | ICD-10-CM | POA: Diagnosis not present

## 2022-09-14 DIAGNOSIS — E785 Hyperlipidemia, unspecified: Secondary | ICD-10-CM | POA: Diagnosis not present

## 2022-09-14 DIAGNOSIS — R7303 Prediabetes: Secondary | ICD-10-CM | POA: Insufficient documentation

## 2022-09-14 DIAGNOSIS — Z7901 Long term (current) use of anticoagulants: Secondary | ICD-10-CM | POA: Insufficient documentation

## 2022-09-14 DIAGNOSIS — Z79899 Other long term (current) drug therapy: Secondary | ICD-10-CM | POA: Insufficient documentation

## 2022-09-14 DIAGNOSIS — D6869 Other thrombophilia: Secondary | ICD-10-CM | POA: Diagnosis not present

## 2022-09-14 DIAGNOSIS — I739 Peripheral vascular disease, unspecified: Secondary | ICD-10-CM | POA: Diagnosis not present

## 2022-09-14 DIAGNOSIS — Z7982 Long term (current) use of aspirin: Secondary | ICD-10-CM | POA: Insufficient documentation

## 2022-09-14 DIAGNOSIS — I480 Paroxysmal atrial fibrillation: Secondary | ICD-10-CM | POA: Diagnosis not present

## 2022-09-14 DIAGNOSIS — I4891 Unspecified atrial fibrillation: Secondary | ICD-10-CM | POA: Insufficient documentation

## 2022-09-14 DIAGNOSIS — Z7902 Long term (current) use of antithrombotics/antiplatelets: Secondary | ICD-10-CM | POA: Insufficient documentation

## 2022-09-14 MED ORDER — ASPIRIN 81 MG PO TBEC: 81.0000 mg | DELAYED_RELEASE_TABLET | Freq: Every day | ORAL | 2 refills | Status: AC

## 2022-09-14 MED ORDER — APIXABAN 5 MG PO TABS: 5.0000 mg | ORAL_TABLET | Freq: Two times a day (BID) | ORAL | 1 refills | Status: DC

## 2022-09-14 MED ORDER — APIXABAN 5 MG PO TABS: 5.0000 mg | ORAL_TABLET | Freq: Two times a day (BID) | ORAL | 3 refills | Status: DC

## 2022-09-14 NOTE — Progress Notes (Signed)
Primary Care Physician: [name], Fransisca Kaufmann, MD Primary Cardiologist: none Primary Electrophysiologist: none Referring Physician: Dr [name]   Tommy [name] is a 71 y.o. male with a history of HTN, HLD, prediabetes, PAD s/p SFA stent 2009, atrial fibrillation who presents for consultation in the [facility].  The patient was initially diagnosed with atrial fibrillation on a Zio monitor after presenting to his PCP with symptoms of dizziness and palpitations. Zio monitor showed 3% afib burden with 12 pauses, longest 4.5 seconds. He was started on metoprolol and referred to the AF clinic. Patient has a CHADS2VASC score of 3-4. Patient reports that since starting metoprolol he has not had any palpitations or dizziness. His smart watch has shown only SR.   Today, he denies symptoms of palpitations, chest pain, shortness of breath, orthopnea, PND, lower extremity edema, dizziness, presyncope, syncope, snoring, daytime somnolence, bleeding, or neurologic sequela. The patient is tolerating medications without difficulties and is otherwise without complaint today.    Atrial Fibrillation Risk Factors:  he does not have symptoms or diagnosis of sleep apnea. he does not have a history of rheumatic fever. he does not have a history of alcohol use. The patient does not have a history of early familial atrial fibrillation or other arrhythmias.  he has a BMI of Body mass index is 27.84 kg/m.Marland Kitchen Filed Weights   09/14/22 0943  Weight: 88 kg    Family History  Problem Relation Age of Onset   Heart disease Mother    Cancer Mother    Deep vein thrombosis Mother    Diabetes Mother    Hyperlipidemia Mother    Hypertension Mother    Heart attack Mother    Peripheral vascular disease Mother    Cancer Father    Colon cancer Neg Hx      Atrial Fibrillation Management history:  Previous antiarrhythmic drugs: none Previous cardioversions: none Previous ablations:  none CHADS2VASC score: 3-4 Anticoagulation history: none   Past Medical History:  Diagnosis Date   BPH with obstruction/lower urinary tract symptoms    hyperplasia   ED (erectile dysfunction)    arterial insuff.   History of DVT of lower extremity    History of gastric ulcer    Hyperlipidemia    Hypertension    Peripheral vascular disease ([place])    Prostate cancer ([place]) DX 01/01/15--  urologist-  dr Jeffie Pollock   Stage T1c, Gleason 3+4, PSA 5.73, vol 83m   Wears glasses    Past Surgical History:  Procedure Laterality Date   FEMORAL ARTERY STENT Left 07-22-2008  &  11-16-2010   left SFA//   In-Stent restenosis   ORIF LEFT INDEX FINGER FX  09-28-2006   PROSTATE BIOPSY  01/01/15   PROSTATE SURGERY  July 2016   Seed Implant   RADIOACTIVE SEED IMPLANT N/A 04/23/2015   Procedure: RADIOACTIVE SEED IMPLANT/BRACHYTHERAPY IMPLANT;  Surgeon: JIrine Seal MD;  [facility]  Service: Urology;  Laterality: N/A;   TONSILLECTOMY  as child    Current Outpatient Medications  Medication Sig Dispense Refill   amLODipine (NORVASC) 10 MG tablet Take 1 tablet (10 mg total) by mouth daily. 90 tablet 3   clopidogrel (PLAVIX) 75 MG tablet TAKE 1 TABLET EVERY DAY 90 tablet 1   fenofibrate (TRICOR) 145 MG tablet TAKE 1 TABLET EVERY DAY 90 tablet 1   hydrochlorothiazide (HYDRODIURIL) 25 MG tablet TAKE 1 TABLET EVERY DAY 90 tablet 3   metoprolol succinate (TOPROL-XL) 25 MG 24  hr tablet Take 1 tablet (25 mg total) by mouth daily. 90 tablet 3   rosuvastatin (CRESTOR) 10 MG tablet TAKE 1 TABLET EVERY EVENING 90 tablet 1   tamsulosin (FLOMAX) 0.4 MG CAPS capsule Take 1 capsule (0.4 mg total) by mouth daily after supper. 90 capsule 3   No current facility-administered medications for this encounter.    Allergies  Allergen Reactions   Penicillins Swelling    Has patient had a PCN reaction causing immediate rash, facial/tongue/throat swelling, SOB or lightheadedness with hypotension:  No Has patient had a PCN reaction causing severe rash involving mucus membranes or skin necrosis: No Has patient had a PCN reaction that required hospitalization No Has patient had a PCN reaction occurring within the last 10 years: No If all of the above answers are "NO", then may proceed with Cephalosporin use.    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  Tobacco Use   Smoking status: Former    Packs/day: 1.00    Years: 15.00    Total pack years: 15.00    Types: Cigarettes    Start date: 09/09/2010    Quit date: 04/21/2011    Years since quitting: 11.4   Smokeless tobacco: Current    Types: Snuff   Tobacco comments:    occasional  someday dips tobacco  Vaping Use   Vaping Use: Never used  Substance and Sexual Activity   Alcohol use: No    Alcohol/week: 0.0 standard drinks of alcohol   Drug use: No   Sexual activity: Not on file  Other Topics Concern   Not on file  Social History Narrative   2 daughters and one son - all live nearby   Social Determinants of Health   Financial Resource Strain: Low Risk  (04/30/2021)   Overall Financial Resource Strain (CARDIA)    Difficulty of Paying Living Expenses: Not hard at all  Food Insecurity: No Food Insecurity (04/30/2021)   Hunger Vital Sign    Worried About Running Out of Food in the Last Year: Never true    Ran Out of Food in the Last Year: Never true  Transportation Needs: No Transportation Needs (04/30/2021)   PRAPARE - Hydrologist (Medical): No    Lack of Transportation (Non-Medical): No  Physical Activity: Sufficiently Active (04/30/2021)   Exercise Vital Sign    Days of Exercise per Week: 7 days    Minutes of Exercise per Session: 40 min  Stress: No  Stress Concern Present (04/30/2021)   [place]    Feeling of Stress : Not at all  Social Connections: [place] (04/30/2021)   Social Connection and Isolation Panel [NHANES]    Frequency of Communication with Friends and Family: More than three times a week    Frequency of Social Gatherings with Friends and Family: More than three times a week    Attends Religious Services: 1 to 4 times per year    Active Member of [employer] or Organizations: Yes    Attends Archivist Meetings: 1 to 4 times per year    Marital Status: Married  Human resources officer Violence: Not At Risk (04/30/2021)   Humiliation,  Afraid, Rape, and Kick questionnaire    Fear of Current or Ex-Partner: No    Emotionally Abused: No    Physically Abused: No    Sexually Abused: No     ROS- All systems are reviewed and negative except as per the HPI above.  Physical Exam: Vitals:   09/14/22 0943  BP: (!) 148/84  Pulse: 71  Weight: 88 kg  Height: '5\' 10"'$  (1.778 m)    GEN- The patient is a well appearing male, alert and oriented x 3 today.   Head- normocephalic, atraumatic Eyes-  Sclera clear, conjunctiva pink Ears- hearing intact Oropharynx- clear Neck- supple  Lungs- Clear to ausculation bilaterally, normal work of breathing Heart- Regular rate and rhythm, no murmurs, rubs or gallops  GI- soft, NT, ND, + BS Extremities- no clubbing, cyanosis, or edema MS- no significant deformity or atrophy Skin- no rash or lesion Psych- euthymic mood, full affect Neuro- strength and sensation are intact  Wt Readings from Last 3 Encounters:  09/14/22 88 kg  08/25/22 86.6 kg  06/15/22 85.8 kg    EKG today demonstrates  SR, PVC Vent. rate 71 BPM PR interval 192 ms QRS duration 82 ms QT/QTcB 378/410 ms   Epic records are reviewed at length today  CHA2DS2-VASc Score = 4  The patient's score is based upon: CHF History: 0 HTN History:  1 Diabetes History: 1 (prediabetes) Stroke History: 0 Vascular Disease History: 1 (PAD) Age Score: 1 Gender Score: 0       ASSESSMENT AND PLAN: 1. Paroxysmal Atrial Fibrillation (ICD10:  I48.0) The patient's CHA2DS2-VASc score is 4, indicating a 4.8% annual risk of stroke.   General education about afib provided and questions answered. We also discussed his stroke risk and the risks and benefits of anticoagulation. Will start Eliquis 5 mg BID  Discussed antiplatelet use with vascular team who recommended he discontinue Plavix and start ASA 81 mg daily.  Check echocardiogram Continue Toprol 25 mg daily He has responded very well to the BB with no further symptoms or episodes detected on his watch. Could consider Multaq, flecainide, dofetilide, or ablation if additional rhythm control is needed. Given his PAD, he may need ischemic evaluation before flecainide could be started.   2. Secondary Hypercoagulable State (ICD10:  D68.69) The patient is at significant risk for stroke/thromboembolism based upon his CHA2DS2-VASc Score of 4.  Start Apixaban (Eliquis).   3. HTN Stable, no changes today.  4. PAD S/p left SFA in 2009 Followed by [facility]   Follow up in the AF clinic in 4 weeks. Will refer to have him establish care with a primary cardiologist as well.    [facility] [address] [place], [place] [address] [phone] 09/14/2022 9:50 AM

## 2022-09-14 NOTE — Patient Instructions (Signed)
STOP plavix   START Eliquis '5mg'$  twice a day  Decrease aspirin to '81mg'$  once a day

## 2022-09-14 NOTE — Addendum Note (Signed)
Encounter addended by: Juluis Mire, RN on: 09/14/2022 3:13 PM  Actions taken: Pharmacy for encounter modified, Order list changed

## 2022-09-27 ENCOUNTER — Ambulatory Visit (INDEPENDENT_AMBULATORY_CARE_PROVIDER_SITE_OTHER): Payer: Medicare HMO | Admitting: Nurse Practitioner

## 2022-09-27 ENCOUNTER — Encounter: Payer: Self-pay | Admitting: Nurse Practitioner

## 2022-09-27 VITALS — BP 132/77 | HR 78 | Temp 97.7°F | Resp 20 | Ht 70.0 in | Wt 188.0 lb

## 2022-09-27 DIAGNOSIS — M7022 Olecranon bursitis, left elbow: Secondary | ICD-10-CM

## 2022-09-27 DIAGNOSIS — S5012XA Contusion of left forearm, initial encounter: Secondary | ICD-10-CM | POA: Diagnosis not present

## 2022-09-27 MED ORDER — PREDNISONE 20 MG PO TABS: 40.0000 mg | ORAL_TABLET | Freq: Every day | ORAL | 0 refills | Status: AC

## 2022-09-27 NOTE — Patient Instructions (Signed)
Elbow Bursitis  Elbow (olecranon) bursitis is the swelling of the fluid-filled sac (bursa) between the tip of your elbow and your skin. A bursa acts as a cushion and protects the joint. If the bursa becomes irritated, it can fill with extra fluid and become inflamed. This condition is also called olecranon bursitis. What are the causes? This condition may be caused by: An elbow injury, such as falling onto the elbow. Leaning on hard surfaces for long periods of time. Infection from an injury that breaks the skin near the elbow. A bone growth (spur) that forms at the tip of the elbow. A medical condition that causes inflammation, such as gout or rheumatoid arthritis. Sometimes the cause is not known. What are the signs or symptoms? The first sign of elbow bursitis is usually swelling at the tip of the elbow. This can grow to be about the size of a golf ball. Swelling may start suddenly or develop gradually. Other symptoms may include: Pain when bending or leaning on the elbow. Stiffness, or not being able to move the elbow normally. If bursitis is caused by an infection, you may have: Redness, warmth, and tenderness of the elbow. Drainage of pus from the swollen area over the elbow, if the skin breaks open. How is this diagnosed? This condition may be diagnosed based on: Your symptoms and medical history. Any recent injuries you have had. A physical exam. X-rays to check for a bone spur or fracture. Draining fluid from the bursa to test it for infection. Blood tests to rule out gout or rheumatoid arthritis. How is this treated? Treatment for this condition depends on the cause. Treatment may include: Medicines. These may include: Over-the-counter medicines to relieve pain and inflammation. Antibiotic medicines if there is an infection. Injections of anti-inflammatory medicines (steroids). Draining fluid from the bursa. Wrapping your elbow with a bandage or compression arm  sleeve. Wearing elbow pads. If these treatments do not help, you may need surgery to remove the bursa. Follow these instructions at home: Medicines Take over-the-counter and prescription medicines only as told by your health care provider. If you were prescribed an antibiotic medicine, take it as told by your health care provider. Do not stop taking the antibiotic even if you start to feel better. Managing pain, stiffness, and swelling     If directed, put ice on the affected area. To do this: Put ice in a plastic bag. Place a towel between your skin and the bag. Leave the ice on for 20 minutes, 2-3 times a day. Remove the ice if your skin turns bright red. This is very important. If you cannot feel pain, heat, or cold, you have a greater risk of damage to the area. If directed, apply heat to the affected area as often as told by your health care provider. Use the heat source that your health care provider recommends, such as a moist heat pack or a heating pad. Place a towel between your skin and the heat source. Leave the heat on 20-30 minutes. Remove the heat if your skin turns bright red. This is especially important if you are unable to feel pain, heat, or cold. You may have a greater risk of getting burned. If your bursitis is caused by an injury, rest your elbow and wear your bandage or sleeve as told by your health care provider. Use elbow pads or elbow wraps to cushion your elbow and control swelling as needed. General instructions Avoid any activities that cause elbow pain. Ask   your health care provider what activities are safe for you. Keep all follow-up visits. This is important. Contact a health care provider if: You have a fever. Your symptoms do not get better with treatment. You have pain or swelling that gets worse, or it goes away and then comes back. You have pus draining from your elbow. You have redness around the elbow area. Your elbow is warm to the touch. Get  help right away if: You have trouble moving your arm, hand, or fingers. Summary Elbow (olecranon) bursitis is the swelling of the fluid-filled sac (bursa) between the tip of your elbow and your skin. Treatment for elbow bursitis depends on the cause. It may include medicines to relieve pain and inflammation, antibiotic medicines to treat an infection, or draining fluid from your elbow. Contact a health care provider if your symptoms do not get better with treatment, or if your symptoms go away and then come back. This information is not intended to replace advice given to you by your health care provider. Make sure you discuss any questions you have with your health care provider. Document Revised: 09/21/2021 Document Reviewed: 09/21/2021 Elsevier Patient Education  2023 [employer]  

## 2022-09-27 NOTE — Progress Notes (Signed)
   Subjective:    Patient ID: Tommy [name], male    DOB: [birth year], 71 y.o.   MRN: [id number]   Chief Complaint: red arm   HPI  Patient come sin c/o discoloration of left forearm. Started several dayas ago after his elbow swelled. No pain .    Review of Systems  Constitutional:  Negative for diaphoresis.  Eyes:  Negative for pain.  Respiratory:  Negative for shortness of breath.   Cardiovascular:  Negative for chest pain, palpitations and leg swelling.  Gastrointestinal:  Negative for abdominal pain.  Endocrine: Negative for polydipsia.  Skin:  Negative for rash.  Neurological:  Negative for dizziness, weakness and headaches.  Hematological:  Does not bruise/bleed easily.  All other systems reviewed and are negative.      Objective:   Physical Exam Constitutional:      Appearance: Normal appearance.  Cardiovascular:     Rate and Rhythm: Normal rate and regular rhythm.     Heart sounds: Normal heart sounds.  Pulmonary:     Breath sounds: Normal breath sounds.  Musculoskeletal:     Comments: FROM of left elbow with effusion No pain Distal ecchymosis to left forearm Cool  to touch  Skin:    General: Skin is warm.  Neurological:     General: No focal deficit present.     Mental Status: He is alert and oriented to person, place, and time.     BP 132/77   Pulse 78   Temp 97.7 F (36.5 C) (Temporal)   Resp 20   Ht '5\' 10"'$  (1.778 m)   Wt 188 lb (85.3 kg)   BMI 26.98 kg/m        Assessment & Plan:  [name] in today with chief complaint of red arm   1. Olecranon bursitis of left elbow Ice Rest Meds ordered this encounter  Medications   predniSONE (DELTASONE) 20 MG tablet    Sig: Take 2 tablets (40 mg total) by mouth daily with breakfast for 5 days. 2 po daily for 5 days    Dispense:  10 tablet    Refill:  0    Order Specific Question:   Supervising Provider    Answer:   Caryl Pina A [[id number]]     2. Contusion of left forearm, initial  encounter Stop wear tennis elbow strap above left elbow- may be contributing to contusion distally Ice if not improving see your PCP    The above assessment and management plan was discussed with the patient. The patient verbalized understanding of and has agreed to the management plan. Patient is aware to call the clinic if symptoms persist or worsen. Patient is aware when to return to the clinic for a follow-up visit. Patient educated on when it is appropriate to go to the emergency department.   Mary-Margaret Hassell Done, FNP

## 2022-10-11 ENCOUNTER — Ambulatory Visit (HOSPITAL_BASED_OUTPATIENT_CLINIC_OR_DEPARTMENT_OTHER)
Admission: RE | Admit: 2022-10-11 | Discharge: 2022-10-11 | Disposition: A | Discharge status: Home / Self Care | Payer: Medicare HMO | Source: Ambulatory Visit | Attending: Family Medicine | Admitting: Family Medicine

## 2022-10-11 ENCOUNTER — Ambulatory Visit (HOSPITAL_COMMUNITY)
Admission: RE | Admit: 2022-10-11 | Discharge: 2022-10-11 | Disposition: A | Discharge status: Home / Self Care | Payer: Medicare HMO | Source: Ambulatory Visit | Attending: Physician Assistant | Admitting: Physician Assistant

## 2022-10-11 ENCOUNTER — Encounter (HOSPITAL_COMMUNITY): Payer: Self-pay | Admitting: Physician Assistant

## 2022-10-11 VITALS — BP 146/88 | HR 67 | Ht 70.0 in | Wt 186.4 lb

## 2022-10-11 DIAGNOSIS — Z7901 Long term (current) use of anticoagulants: Secondary | ICD-10-CM | POA: Insufficient documentation

## 2022-10-11 DIAGNOSIS — I480 Paroxysmal atrial fibrillation: Secondary | ICD-10-CM | POA: Diagnosis not present

## 2022-10-11 DIAGNOSIS — Z8249 Family history of ischemic heart disease and other diseases of the circulatory system: Secondary | ICD-10-CM | POA: Insufficient documentation

## 2022-10-11 DIAGNOSIS — D6869 Other thrombophilia: Secondary | ICD-10-CM | POA: Diagnosis not present

## 2022-10-11 DIAGNOSIS — Z79899 Other long term (current) drug therapy: Secondary | ICD-10-CM | POA: Insufficient documentation

## 2022-10-11 DIAGNOSIS — R7303 Prediabetes: Secondary | ICD-10-CM | POA: Insufficient documentation

## 2022-10-11 DIAGNOSIS — I10 Essential (primary) hypertension: Secondary | ICD-10-CM | POA: Diagnosis not present

## 2022-10-11 DIAGNOSIS — E785 Hyperlipidemia, unspecified: Secondary | ICD-10-CM | POA: Diagnosis not present

## 2022-10-11 DIAGNOSIS — Z87891 Personal history of nicotine dependence: Secondary | ICD-10-CM | POA: Diagnosis not present

## 2022-10-11 DIAGNOSIS — I739 Peripheral vascular disease, unspecified: Secondary | ICD-10-CM | POA: Insufficient documentation

## 2022-10-11 LAB — ECHOCARDIOGRAM COMPLETE
AR max vel: 2.76 cm2
AV Area VTI: 3.1 cm2
AV Area mean vel: 2.72 cm2
AV Mean grad: 7 mmHg
AV Peak grad: 13.4 mmHg
Ao pk vel: 1.83 m/s
Area-P 1/2: 1.88 cm2
S' Lateral: 2.2 cm

## 2022-10-11 LAB — CBC
HCT: 43.9 % (ref 39.0–52.0)
Hemoglobin: 15.4 g/dL (ref 13.0–17.0)
MCH: 30.1 pg (ref 26.0–34.0)
MCHC: 35.1 g/dL (ref 30.0–36.0)
MCV: 85.7 fL (ref 80.0–100.0)
Platelets: 208 10*3/uL (ref 150–400)
RBC: 5.12 MIL/uL (ref 4.22–5.81)
RDW: 12.2 % (ref 11.5–15.5)
WBC: 5.3 10*3/uL (ref 4.0–10.5)
nRBC: 0 % (ref 0.0–0.2)

## 2022-10-11 MED ORDER — PERFLUTREN LIPID MICROSPHERE
1.0000 mL | INTRAVENOUS | Status: DC | PRN
  Administered 2022-10-11: 3 mL via INTRAVENOUS
  Filled 2022-10-11: qty 10

## 2022-10-11 NOTE — Progress Notes (Signed)
Primary Care Physician: [name], Fransisca Kaufmann, MD Primary Cardiologist: none Primary Electrophysiologist: none Referring Physician: Dr [name]   Tommy [name] is a 72 y.o. male with a history of HTN, HLD, prediabetes, PAD s/p SFA stent 2009, atrial fibrillation who presents for follow up in the [facility].  The patient was initially diagnosed with atrial fibrillation on a Zio monitor after presenting to his PCP with symptoms of dizziness and palpitations. Zio monitor showed 3% afib burden with 12 pauses, longest 4.5 seconds. He was started on metoprolol and referred to the AF clinic. Patient has a CHADS2VASC score of 3-4. Started on Eliquis 09/14/22.  On follow up today, patient reports that he has done well since his last visit. He has not had any further afib episodes detected on his smart watch. No bleeding issues on anticoagulation.   Today, he denies symptoms of palpitations, chest pain, shortness of breath, orthopnea, PND, lower extremity edema, dizziness, presyncope, syncope, snoring, daytime somnolence, bleeding, or neurologic sequela. The patient is tolerating medications without difficulties and is otherwise without complaint today.    Atrial Fibrillation Risk Factors:  he does not have symptoms or diagnosis of sleep apnea. he does not have a history of rheumatic fever. he does not have a history of alcohol use. The patient does not have a history of early familial atrial fibrillation or other arrhythmias.  he has a BMI of Body mass index is 26.75 kg/m.Marland Kitchen Filed Weights   10/11/22 1014  Weight: 84.6 kg    Family History  Problem Relation Age of Onset   Heart disease Mother    Cancer Mother    Deep vein thrombosis Mother    Diabetes Mother    Hyperlipidemia Mother    Hypertension Mother    Heart attack Mother    Peripheral vascular disease Mother    Cancer Father    Colon cancer Neg Hx      Atrial Fibrillation Management  history:  Previous antiarrhythmic drugs: none Previous cardioversions: none Previous ablations: none CHADS2VASC score: 3-4 Anticoagulation history: Eliquis   Past Medical History:  Diagnosis Date   BPH with obstruction/lower urinary tract symptoms    hyperplasia   ED (erectile dysfunction)    arterial insuff.   History of DVT of lower extremity    History of gastric ulcer    Hyperlipidemia    Hypertension    Peripheral vascular disease ([place])    Prostate cancer ([place]) DX 01/01/15--  urologist-  dr Jeffie Pollock   Stage T1c, Gleason 3+4, PSA 5.73, vol 62m   Wears glasses    Past Surgical History:  Procedure Laterality Date   FEMORAL ARTERY STENT Left 07-22-2008  &  11-16-2010   left SFA//   In-Stent restenosis   ORIF LEFT INDEX FINGER FX  09-28-2006   PROSTATE BIOPSY  01/01/15   PROSTATE SURGERY  July 2016   Seed Implant   RADIOACTIVE SEED IMPLANT N/A 04/23/2015   Procedure: RADIOACTIVE SEED IMPLANT/BRACHYTHERAPY IMPLANT;  Surgeon: JIrine Seal MD;  Location: [facility]  Service: Urology;  Laterality: N/A;   TONSILLECTOMY  as child    Current Outpatient Medications  Medication Sig Dispense Refill   amLODipine (NORVASC) 10 MG tablet Take 1 tablet (10 mg total) by mouth daily. 90 tablet 3   apixaban (ELIQUIS) 5 MG TABS tablet Take 1 tablet (5 mg total) by mouth 2 (two) times daily. 180 tablet 1   aspirin EC 81 MG tablet Take 1 tablet (81 mg  total) by mouth daily. Swallow whole. 150 tablet 2   fenofibrate (TRICOR) 145 MG tablet TAKE 1 TABLET EVERY DAY 90 tablet 1   hydrochlorothiazide (HYDRODIURIL) 25 MG tablet TAKE 1 TABLET EVERY DAY 90 tablet 3   metoprolol succinate (TOPROL-XL) 25 MG 24 hr tablet Take 1 tablet (25 mg total) by mouth daily. 90 tablet 3   rosuvastatin (CRESTOR) 10 MG tablet TAKE 1 TABLET EVERY EVENING 90 tablet 1   tamsulosin (FLOMAX) 0.4 MG CAPS capsule Take 1 capsule (0.4 mg total) by mouth daily after supper. 90 capsule 3   No current  facility-administered medications for this encounter.   Facility-Administered Medications Ordered in Other Encounters  Medication Dose Route Frequency Provider Last Rate Last Admin   perflutren lipid microspheres (DEFINITY) IV suspension  1-10 mL Intravenous PRN Tommy Wettstein R, PA   3 mL at 10/11/22 0954    Allergies  Allergen Reactions   Penicillins Swelling    Has patient had a PCN reaction causing immediate rash, facial/tongue/throat swelling, SOB or lightheadedness with hypotension: No Has patient had a PCN reaction causing severe rash involving mucus membranes or skin necrosis: No Has patient had a PCN reaction that required hospitalization No Has patient had a PCN reaction occurring within the last 10 years: No If all of the above answers are "NO", then may proceed with Cephalosporin use.    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  Tobacco Use   Smoking status: Former    Packs/day: 1.00    Years: 15.00    Total pack years: 15.00    Types: Cigarettes    Start date: 09/09/2010    Quit date: 04/21/2011    Years since quitting: 11.4   Smokeless tobacco: Current    Types: Snuff   Tobacco comments:    occasional  someday dips tobacco  Vaping Use   Vaping Use: Never used  Substance and Sexual Activity   Alcohol use: No    Alcohol/week: 0.0 standard drinks of alcohol   Drug use: No   Sexual activity: Not on file  Other Topics Concern   Not on file  Social History Narrative   2 daughters and one son - all live nearby   Social Determinants of Health   Financial Resource Strain: Low Risk  (04/30/2021)   Overall Financial Resource Strain (CARDIA)    Difficulty of Paying Living Expenses: Not hard at all  Food Insecurity: No Food  Insecurity (04/30/2021)   Hunger Vital Sign    Worried About Running Out of Food in the Last Year: Never true    Ran Out of Food in the Last Year: Never true  Transportation Needs: No Transportation Needs (04/30/2021)   PRAPARE - Hydrologist (Medical): No    Lack of Transportation (Non-Medical): No  Physical Activity: Sufficiently Active (04/30/2021)   Exercise Vital Sign    Days of Exercise per Week: 7 days    Minutes of Exercise per Session: 40 min  Stress: No Stress Concern Present (04/30/2021)   [place]    Feeling of Stress : Not at all  Social Connections: [place] (04/30/2021)   Social Connection and  Isolation Panel [NHANES]    Frequency of Communication with Friends and Family: More than three times a week    Frequency of Social Gatherings with Friends and Family: More than three times a week    Attends Religious Services: 1 to 4 times per year    Active Member of [employer] or Organizations: Yes    Attends Archivist Meetings: 1 to 4 times per year    Marital Status: Married  Human resources officer Violence: Not At Risk (04/30/2021)   Humiliation, Afraid, Rape, and Kick questionnaire    Fear of Current or Ex-Partner: No    Emotionally Abused: No    Physically Abused: No    Sexually Abused: No     ROS- All systems are reviewed and negative except as per the HPI above.  Physical Exam: Vitals:   10/11/22 1014  BP: (!) 146/88  Pulse: 67  Weight: 84.6 kg  Height: '5\' 10"'$  (1.778 m)    GEN- The patient is a well appearing male, alert and oriented x 3 today.   HEENT-head normocephalic, atraumatic, sclera clear, conjunctiva pink, hearing intact, trachea midline. Lungs- Clear to ausculation bilaterally, normal work of breathing Heart- Regular rate and rhythm, no murmurs, rubs or gallops  GI- soft, NT, ND, + BS Extremities- no clubbing, cyanosis, or edema MS- no  significant deformity or atrophy Skin- no rash or lesion Psych- euthymic mood, full affect Neuro- strength and sensation are intact   Wt Readings from Last 3 Encounters:  10/11/22 84.6 kg  09/27/22 85.3 kg  09/14/22 88 kg    EKG today demonstrates  SR, PVC Vent. rate 67 BPM PR interval 162 ms QRS duration 82 ms QT/QTcB 380/401 ms  Echo 10/11/22  1. Left ventricular ejection fraction, by estimation, is 65 to 70%. The  left ventricle has normal function. The left ventricle has no regional  wall motion abnormalities. There is severe concentric left ventricular  hypertrophy. Left ventricular diastolic parameters are consistent with Grade I diastolic dysfunction (impaired relaxation).   2. Right ventricular systolic function is hyperdynamic. The right  ventricular size is normal. Tricuspid regurgitation signal is inadequate  for assessing PA pressure.   3. Left atrial size was mildly dilated.   4. The mitral valve is normal in structure. No evidence of mitral valve  regurgitation. No evidence of mitral stenosis. Moderate mitral annular  calcification.   5. The aortic valve is tricuspid. There is mild calcification of the  aortic valve. Aortic valve regurgitation is not visualized. Aortic valve  sclerosis/calcification is present, without any evidence of aortic  stenosis.    Epic records are reviewed at length today  CHA2DS2-VASc Score = 4  The patient's score is based upon: CHF History: 0 HTN History: 1 Diabetes History: 1 (prediabetes) Stroke History: 0 Vascular Disease History: 1 (PAD) Age Score: 1 Gender Score: 0       ASSESSMENT AND PLAN: 1. Paroxysmal Atrial Fibrillation (ICD10:  I48.0) The patient's CHA2DS2-VASc score is 4, indicating a 4.8% annual risk of stroke.   Continue Eliquis 5 mg BID and ASA per vascular team. Patient appears to be maintaining SR. Continue Toprol 25 mg daily He has responded very well to the BB with no further symptoms or episodes  detected on his watch. Could consider Multaq, dofetilide, or ablation if additional rhythm control is needed. Would avoid class IC with LVH.  2. Secondary Hypercoagulable State (ICD10:  D68.69) The patient is at significant risk for stroke/thromboembolism based upon his CHA2DS2-VASc Score of  4.  Start Apixaban (Eliquis).   3. HTN Stable, no changes today.  4. PAD S/p left SFA in 2009 Followed by [facility]   Follow up in the AF clinic as needed. Referred to establish care with cardiology in [place].    [facility] [address] [place], [place] [address] [phone] 10/11/2022 10:23 AM

## 2022-10-24 ENCOUNTER — Other Ambulatory Visit: Payer: Self-pay | Admitting: Family Medicine

## 2022-10-24 DIAGNOSIS — E785 Hyperlipidemia, unspecified: Secondary | ICD-10-CM

## 2022-12-01 ENCOUNTER — Ambulatory Visit: Payer: Medicare HMO | Admitting: Family Medicine

## 2022-12-08 ENCOUNTER — Encounter: Payer: Self-pay | Admitting: Radiology

## 2022-12-15 ENCOUNTER — Ambulatory Visit (INDEPENDENT_AMBULATORY_CARE_PROVIDER_SITE_OTHER): Payer: Medicare HMO | Admitting: Family Medicine

## 2022-12-15 ENCOUNTER — Encounter: Payer: Self-pay | Admitting: Family Medicine

## 2022-12-15 VITALS — BP 147/74 | HR 60 | Ht 70.0 in | Wt 189.0 lb

## 2022-12-15 DIAGNOSIS — Z23 Encounter for immunization: Secondary | ICD-10-CM

## 2022-12-15 DIAGNOSIS — E785 Hyperlipidemia, unspecified: Secondary | ICD-10-CM

## 2022-12-15 DIAGNOSIS — R7303 Prediabetes: Secondary | ICD-10-CM

## 2022-12-15 DIAGNOSIS — I10 Essential (primary) hypertension: Secondary | ICD-10-CM | POA: Diagnosis not present

## 2022-12-15 DIAGNOSIS — N400 Enlarged prostate without lower urinary tract symptoms: Secondary | ICD-10-CM

## 2022-12-15 LAB — BAYER DCA HB A1C WAIVED: HB A1C (BAYER DCA - WAIVED): 5.4 % (ref 4.8–5.6)

## 2022-12-15 MED ORDER — FENOFIBRATE 145 MG PO TABS: 145.0000 mg | ORAL_TABLET | Freq: Every day | ORAL | 3 refills | Status: DC

## 2022-12-15 MED ORDER — AMLODIPINE BESYLATE 10 MG PO TABS: 10.0000 mg | ORAL_TABLET | Freq: Every day | ORAL | 3 refills | Status: DC

## 2022-12-15 MED ORDER — ROSUVASTATIN CALCIUM 10 MG PO TABS: 10.0000 mg | ORAL_TABLET | Freq: Every evening | ORAL | 3 refills | Status: DC

## 2022-12-15 MED ORDER — TAMSULOSIN HCL 0.4 MG PO CAPS: 0.4000 mg | ORAL_CAPSULE | Freq: Every day | ORAL | 3 refills | Status: DC

## 2022-12-15 NOTE — Addendum Note (Signed)
Addended by: Alphonzo Dublin on: 12/15/2022 01:33 PM   Modules accepted: Orders

## 2022-12-15 NOTE — Progress Notes (Signed)
BP (!) 147/74   Pulse 60   Ht '5\' 10"'$  (1.778 m)   Wt 189 lb (85.7 kg)   SpO2 96%   BMI 27.12 kg/m    Subjective:   Patient ID: Tommy [name], male    DOB: 11/17/1950, 72 y.o.   MRN: [id number]  HPI: Tommy [name] is a 72 y.o. male presenting on 12/15/2022 for Medical Management of Chronic Issues, Prediabetes, Hypertension, and Hyperlipidemia   HPI Prediabetes Patient comes in today for recheck of his diabetes. Patient has been currently taking no medicine, diet control, A1c 5.4 today. Patient is not currently on an ACE inhibitor/ARB. Patient has not seen an ophthalmologist this year. Patient denies any issues with their feet. The symptom started onset as an adult hypertension and hyperlipidemia ARE RELATED TO DM   Hypertension Patient is currently on amlodipine and hydrochlorothiazide metoprolol, and their blood pressure today is 147/74. Patient denies any lightheadedness or dizziness. Patient denies headaches, blurred vision, chest pains, shortness of breath, or weakness. Denies any side effects from medication and is content with current medication.   Hyperlipidemia Patient is coming in for recheck of his hyperlipidemia. The patient is currently taking fenofibrate and Crestor. They deny any issues with myalgias or history of liver damage from it. They deny any focal numbness or weakness or chest pain.   Relevant past medical, surgical, family and social history reviewed and updated as indicated. Interim medical history since our last visit reviewed. Allergies and medications reviewed and updated.  Review of Systems  Constitutional:  Negative for chills and fever.  Eyes:  Negative for visual disturbance.  Respiratory:  Negative for shortness of breath and wheezing.   Cardiovascular:  Negative for chest pain and leg swelling.  Musculoskeletal:  Negative for back pain and gait problem.  Skin:  Negative for rash.  Neurological:  Negative for dizziness, weakness and light-headedness.   All other systems reviewed and are negative.   Per HPI unless specifically indicated above   Allergies as of 12/15/2022       Reactions   Penicillins Swelling   Has patient had a PCN reaction causing immediate rash, facial/tongue/throat swelling, SOB or lightheadedness with hypotension: No Has patient had a PCN reaction causing severe rash involving mucus membranes or skin necrosis: No Has patient had a PCN reaction that required hospitalization No Has patient had a PCN reaction occurring within the last 10 years: No If all of the above answers are "NO", then may proceed with Cephalosporin use.        Medication List        Accurate as of December 15, 2022 11:19 AM. If you have any questions, ask your nurse or doctor.          amLODipine 10 MG tablet Commonly known as: NORVASC Take 1 tablet (10 mg total) by mouth daily.   apixaban 5 MG Tabs tablet Commonly known as: Eliquis Take 1 tablet (5 mg total) by mouth 2 (two) times daily.   aspirin EC 81 MG tablet Take 1 tablet (81 mg total) by mouth daily. Swallow whole.   fenofibrate 145 MG tablet Commonly known as: TRICOR Take 1 tablet (145 mg total) by mouth daily.   hydrochlorothiazide 25 MG tablet Commonly known as: HYDRODIURIL TAKE 1 TABLET EVERY DAY   metoprolol succinate 25 MG 24 hr tablet Commonly known as: TOPROL-XL Take 1 tablet (25 mg total) by mouth daily.   rosuvastatin 10 MG tablet Commonly known as: CRESTOR Take 1 tablet (  10 mg total) by mouth every evening.   tamsulosin 0.4 MG Caps capsule Commonly known as: FLOMAX Take 1 capsule (0.4 mg total) by mouth daily after supper.         Objective:   BP (!) 147/74   Pulse 60   Ht '5\' 10"'$  (1.778 m)   Wt 189 lb (85.7 kg)   SpO2 96%   BMI 27.12 kg/m   Wt Readings from Last 3 Encounters:  12/15/22 189 lb (85.7 kg)  10/11/22 186 lb 6.4 oz (84.6 kg)  09/27/22 188 lb (85.3 kg)    Physical Exam Vitals and nursing note reviewed.  Constitutional:       General: He is not in acute distress.    Appearance: He is well-developed. He is not diaphoretic.  Eyes:     General: No scleral icterus.    Conjunctiva/sclera: Conjunctivae normal.  Neck:     Thyroid: No thyromegaly.  Cardiovascular:     Rate and Rhythm: Normal rate and regular rhythm.     Heart sounds: Normal heart sounds. No murmur heard. Pulmonary:     Effort: Pulmonary effort is normal. No respiratory distress.     Breath sounds: Normal breath sounds. No wheezing.  Musculoskeletal:        General: Swelling (Trace) present. Normal range of motion.     Cervical back: Neck supple.  Lymphadenopathy:     Cervical: No cervical adenopathy.  Skin:    General: Skin is warm and dry.     Findings: No rash.  Neurological:     Mental Status: He is alert and oriented to person, place, and time.     Coordination: Coordination normal.  Psychiatric:        Behavior: Behavior normal.       Assessment & Plan:   Problem List Items Addressed This Visit       Cardiovascular and Mediastinum   Essential hypertension   Relevant Medications   amLODipine (NORVASC) 10 MG tablet   fenofibrate (TRICOR) 145 MG tablet   rosuvastatin (CRESTOR) 10 MG tablet     Other   Hyperlipidemia LDL goal <130   Relevant Medications   amLODipine (NORVASC) 10 MG tablet   fenofibrate (TRICOR) 145 MG tablet   rosuvastatin (CRESTOR) 10 MG tablet   Prediabetes - Primary   Relevant Orders   Bayer DCA Hb A1c Waived   Other Visit Diagnoses     Enlarged prostate       Relevant Medications   tamsulosin (FLOMAX) 0.4 MG CAPS capsule       Blood pressure slightly elevated today, he will keep a close eye on it at home and let us know if it is running too high.  He also has a soon follow-up with cardiology. Follow up plan: Return in about 3 months (around 03/17/2023), or if symptoms worsen or fail to improve, for Prediabetes and hypertension.  Counseling provided for all of the vaccine components Orders  Placed This Encounter  Procedures   Bayer [place] Hb A1c [place] Feven Alderfer, MD [place]  [facility] 12/15/2022, 11:19 AM

## 2023-01-11 ENCOUNTER — Encounter: Payer: Self-pay | Admitting: Family Medicine

## 2023-01-11 ENCOUNTER — Ambulatory Visit: Payer: Medicare HMO | Admitting: Family Medicine

## 2023-01-20 ENCOUNTER — Other Ambulatory Visit: Payer: Self-pay | Admitting: *Deleted

## 2023-01-20 DIAGNOSIS — I739 Peripheral vascular disease, unspecified: Secondary | ICD-10-CM

## 2023-01-30 ENCOUNTER — Ambulatory Visit: Payer: Medicare HMO | Admitting: Physician Assistant

## 2023-01-30 ENCOUNTER — Ambulatory Visit (INDEPENDENT_AMBULATORY_CARE_PROVIDER_SITE_OTHER)
Admission: RE | Admit: 2023-01-30 | Discharge: 2023-01-30 | Disposition: A | Discharge status: Home / Self Care | Payer: Medicare HMO | Source: Ambulatory Visit | Attending: Vascular Surgery | Admitting: Vascular Surgery

## 2023-01-30 ENCOUNTER — Ambulatory Visit (HOSPITAL_COMMUNITY)
Admission: RE | Admit: 2023-01-30 | Discharge: 2023-01-30 | Disposition: A | Discharge status: Home / Self Care | Payer: Medicare HMO | Source: Ambulatory Visit | Attending: Vascular Surgery | Admitting: Vascular Surgery

## 2023-01-30 VITALS — BP 157/81 | HR 54 | Temp 97.7°F | Wt 191.0 lb

## 2023-01-30 DIAGNOSIS — I739 Peripheral vascular disease, unspecified: Secondary | ICD-10-CM | POA: Insufficient documentation

## 2023-01-30 DIAGNOSIS — I714 Abdominal aortic aneurysm, without rupture, unspecified: Secondary | ICD-10-CM

## 2023-01-30 DIAGNOSIS — I7143 Infrarenal abdominal aortic aneurysm, without rupture: Secondary | ICD-10-CM | POA: Diagnosis not present

## 2023-01-30 LAB — VAS US ABI WITH/WO TBI
Left ABI: 1.11
Right ABI: 1.12

## 2023-01-30 NOTE — Progress Notes (Signed)
Office Note     CC:  follow up Requesting Provider:  [name], [name], MD  HPI: Tommy [name] is a 72 y.o. (December 18, 1950) male who presents for surveillance of PAD and AAA.  Surgical history significant for left SFA stent in October 2009 with Dr. Myra Gianotti.  He denies any claudication, rest pain, or tissue loss of bilateral lower extremities.  He does have some edema of the right lower extremity however this is managed by wearing compression socks while working.  He is a former smoker.  He takes an aspirin and statin daily.  He is also followed for AAA.  1 year ago AAA measured 4.7 cm by duplex.  He denies any new or changing abdominal or back pain.  He is on Eliquis for atrial fibrillation.   Past Medical History:  Diagnosis Date   BPH with obstruction/lower urinary tract symptoms    hyperplasia   ED (erectile dysfunction)    arterial insuff.   History of DVT of lower extremity    History of gastric ulcer    Hyperlipidemia    Hypertension    Peripheral vascular disease    Prostate cancer DX 01/01/15--  urologist-  dr Annabell Howells   Stage T1c, Gleason 3+4, PSA 5.73, vol 31ml   Wears glasses     Past Surgical History:  Procedure Laterality Date   FEMORAL ARTERY STENT Left 07-22-2008  &  11-16-2010   left SFA//   In-Stent restenosis   ORIF LEFT INDEX FINGER FX  09-28-2006   PROSTATE BIOPSY  01/01/15   PROSTATE SURGERY  July 2016   Seed Implant   RADIOACTIVE SEED IMPLANT N/A 04/23/2015   Procedure: RADIOACTIVE SEED IMPLANT/BRACHYTHERAPY IMPLANT;  Surgeon: Bjorn Pippin, MD;  Location: [facility] [place];  Service: Urology;  Laterality: N/A;   TONSILLECTOMY  as child    Social History   Socioeconomic History   Marital status: Married    Spouse name: Jamesetta So   Number of children: 3   Years of education: Not on file   Highest education level: Not on file  Occupational History    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  Tobacco Use   Smoking status:  Former    Packs/day: 1.00    Years: 15.00    Additional pack years: 0.00    Total pack years: 15.00    Types: Cigarettes    Start date: 09/09/2010    Quit date: 04/21/2011    Years since quitting: 11.7   Smokeless tobacco: Current    Types: Snuff   Tobacco comments:    occasional  someday dips tobacco  Vaping Use   Vaping Use: Never used  Substance and Sexual Activity   Alcohol use: No    Alcohol/week: 0.0 standard drinks of alcohol   Drug use: No   Sexual activity: Not on file  Other Topics Concern   Not on file  Social History Narrative   2 daughters and one son - all live nearby   Social Determinants of Health   Financial Resource Strain: Low Risk  (04/30/2021)   Overall Financial Resource Strain (CARDIA)    Difficulty of Paying Living Expenses: Not hard at all  Food Insecurity: No Food Insecurity (04/30/2021)   Hunger Vital Sign    Worried About Running Out of Food in the Last Year: Never true    Ran Out of Food in the Last Year: Never true  Transportation Needs: No Transportation Needs (04/30/2021)   PRAPARE - Transportation  Lack of Transportation (Medical): No    Lack of Transportation (Non-Medical): No  Physical Activity: Sufficiently Active (04/30/2021)   Exercise Vital Sign    Days of Exercise per Week: 7 days    Minutes of Exercise per Session: 40 min  Stress: No Stress Concern Present (04/30/2021)   [employer] of Occupational Health - Occupational Stress Questionnaire    Feeling of Stress : Not at all  Social Connections: Socially Integrated (04/30/2021)   Social Connection and Isolation Panel [NHANES]    Frequency of Communication with Friends and Family: More than three times a week    Frequency of Social Gatherings with Friends and Family: More than three times a week    Attends Religious Services: 1 to 4 times per year    Active Member of [employer] or Organizations: Yes    Attends Banker Meetings: 1 to 4 times per year    Marital Status:  Married  Catering manager Violence: Not At Risk (04/30/2021)   Humiliation, Afraid, Rape, and Kick questionnaire    Fear of Current or Ex-Partner: No    Emotionally Abused: No    Physically Abused: No    Sexually Abused: No    Family History  Problem Relation Age of Onset   Heart disease Mother    Cancer Mother    Deep vein thrombosis Mother    Diabetes Mother    Hyperlipidemia Mother    Hypertension Mother    Heart attack Mother    Peripheral vascular disease Mother    Cancer Father    Colon cancer Neg Hx     Current Outpatient Medications  Medication Sig Dispense Refill   amLODipine (NORVASC) 10 MG tablet Take 1 tablet (10 mg total) by mouth daily. 90 tablet 3   apixaban (ELIQUIS) 5 MG TABS tablet Take 1 tablet (5 mg total) by mouth 2 (two) times daily. 180 tablet 1   aspirin EC 81 MG tablet Take 1 tablet (81 mg total) by mouth daily. Swallow whole. 150 tablet 2   fenofibrate (TRICOR) 145 MG tablet Take 1 tablet (145 mg total) by mouth daily. 90 tablet 3   hydrochlorothiazide (HYDRODIURIL) 25 MG tablet TAKE 1 TABLET EVERY DAY 90 tablet 3   metoprolol succinate (TOPROL-XL) 25 MG 24 hr tablet Take 1 tablet (25 mg total) by mouth daily. 90 tablet 3   rosuvastatin (CRESTOR) 10 MG tablet Take 1 tablet (10 mg total) by mouth every evening. 90 tablet 3   tamsulosin (FLOMAX) 0.4 MG CAPS capsule Take 1 capsule (0.4 mg total) by mouth daily after supper. 90 capsule 3   No current facility-administered medications for this visit.    Allergies  Allergen Reactions   Penicillins Swelling    Has patient had a PCN reaction causing immediate rash, facial/tongue/throat swelling, SOB or lightheadedness with hypotension: No Has patient had a PCN reaction causing severe rash involving mucus membranes or skin necrosis: No Has patient had a PCN reaction that required hospitalization No Has patient had a PCN reaction occurring within the last 10 years: No If all of the above answers are "NO",  then may proceed with Cephalosporin use.     REVIEW OF SYSTEMS:   [X]  denotes positive finding, [ ]  denotes negative finding Cardiac  Comments:  Chest pain or chest pressure:    Shortness of breath upon exertion:    Short of breath when lying flat:    Irregular heart rhythm:        Vascular  Pain in calf, thigh, or hip brought on by ambulation:    Pain in feet at night that wakes you up from your sleep:     Blood clot in your veins:    Leg swelling:         Pulmonary    Oxygen at home:    Productive cough:     Wheezing:         Neurologic    Sudden weakness in arms or legs:     Sudden numbness in arms or legs:     Sudden onset of difficulty speaking or slurred speech:    Temporary loss of vision in one eye:     Problems with dizziness:         Gastrointestinal    Blood in stool:     Vomited blood:         Genitourinary    Burning when urinating:     Blood in urine:        Psychiatric    Major depression:         Hematologic    Bleeding problems:    Problems with blood clotting too easily:        Skin    Rashes or ulcers:        Constitutional    Fever or chills:      PHYSICAL EXAMINATION:  Vitals:   01/30/23 0841  BP: (!) 157/81  Pulse: (!) 54  Temp: 97.7 F (36.5 C)  TempSrc: Temporal  SpO2: 96%  Weight: 191 lb (86.6 kg)    General:  WDWN in NAD; vital signs documented above Gait: Not observed HENT: WNL, normocephalic Pulmonary: normal non-labored breathing , without Rales, rhonchi,  wheezing Cardiac: irregular HR Abdomen: soft, NT, no masses Skin: without rashes Vascular Exam/Pulses: Symmetrical radial pulses; palpable right PT and left DP Extremities: without ischemic changes, without Gangrene , without cellulitis; without open wounds; stasis pigmentation changes of the right lower leg Musculoskeletal: no muscle wasting or atrophy  Neurologic: A&O X 3 Psychiatric:  The pt has Normal affect.   Non-Invasive Vascular Imaging:   4.7 cm  AAA at largest diameter  ABI/TBIToday's ABIToday's TBIPrevious ABIPrevious TBI  +-------+-----------+-----------+------------+------------+  Right 1.12       0.90       1.14        0.74          +-------+-----------+-----------+------------+------------+  Left  1.11       0.90       1.05        0.86          +-------+-----------+-----------+------------+----------     ASSESSMENT/PLAN:: 72 y.o. male here for follow up for surveillance of PAD and AAA  -Bilateral lower extremities are well-perfused with palpable pedal pulses.  ABIs and TBI's are within normal limits bilaterally.  He is without rest pain, tissue loss, or claudication.  He has what appears to be venous insufficiency of the right lower extremity.  Recommended continued regular use of compression socks and to periodically elevate the leg above the level of the heart when resting during the day.  AAA is stable compared to duplex from 1 year ago.  AAA measures 4.7 cm at the largest diameter.  No indication for repair at this time.  Repeat duplex in 1 year.  Will also repeat ABIs at that time.  Continue aspirin and statin daily.   Emilie Rutter, PA-C [facility] [phone]  Clinic MD:   Myra Gianotti

## 2023-02-16 ENCOUNTER — Telehealth (INDEPENDENT_AMBULATORY_CARE_PROVIDER_SITE_OTHER): Payer: Self-pay | Admitting: Family Medicine

## 2023-02-16 NOTE — Telephone Encounter (Signed)
Tommy [name]  scheduled for  their annual wellness visit. Appointment made for 06/05/2023.  Thank you,  Judeth Cornfield,  AMB Clinical Support [place] [facility] Direct Dial ??[phone]

## 2023-03-12 NOTE — Progress Notes (Unsigned)
  Cardiology Office Note:   Date:  03/15/2023  ID:  Tommy [name], DOB 05/07/1951, MRN [id number] PCP: [name], [name], MD  [facility] Providers Cardiologist:  None    History of Present Illness:   Tommy [name] is a 72 y.o. male who is here for his first appointment with me.  He has been seen in the atrial fibrillation clinic.  He has a history of PAD status post SFA stenting in 2009.  He is in atrial fibrillation with a 3% burden.  He has had occasional pauses of 4.5 seconds.  Has not noticed any palpitations.  He has a smart watch.  Since he was seen he has had a couple episodes of atrial fibrillation.  He thinks that these have been short-lived.  They might last about 15 minutes.  He has not recorded them on his Watch.  He has not had any presyncope or syncope.  He denies any chest pressure, neck or arm discomfort.  He has not had any slow heart rates to bother him although when he was wearing his watch occasionally he would be alerted to this.  He is active working as a Electrical engineer.     ROS: As stated in the HPI and negative for all other systems.  Studies Reviewed:    EKG: Sinus rhythm, rate 57, axis within normal limits, intervals within normal limits, no acute ST-T wave changes.   Risk Assessment/Calculations:    CHA2DS2-VASc Score = 4   This indicates a 4.8% annual risk of stroke. The patient's score is based upon: CHF History: 0 HTN History: 1 Diabetes History: 1 (prediabetes) Stroke History: 0 Vascular Disease History: 1 (PAD) Age Score: 1 Gender Score: 0    Physical Exam:   VS:  BP 138/70   Pulse (!) 57   Ht 5\' 11"  (1.803 m)   Wt 193 lb (87.5 kg)   BMI 26.92 kg/m    Wt Readings from Last 3 Encounters:  03/15/23 193 lb (87.5 kg)  01/30/23 191 lb (86.6 kg)  12/15/22 189 lb (85.7 kg)     GEN: Well nourished, well developed in no acute distress NECK: No JVD; No carotid bruits CARDIAC: RRR, no murmurs, rubs, gallops RESPIRATORY:  Clear to auscultation  without rales, wheezing or rhonchi  ABDOMEN: Soft, non-tender, non-distended EXTREMITIES:  No edema; No deformity   ASSESSMENT AND PLAN:   Paroxysmal Atrial Fibrillation (ICD10:  I48.0): He has some short paroxysms.  He tolerates anticoagulation.  He has some bradycardia arrhythmias but seems to tolerate the beta-blocker.  At this point no change in therapy.   Secondary Hypercoagulable State (ICD10:  D68.69): He has a contraindication anticoagulation and he tolerates it and he is on the appropriate dose.  He is up-to-date with lab work.  HTN: The blood pressure is upper limits but better than recent readings.  He should keep a blood pressure diary and meds can be adjusted as needed.   PAD: He is followed by [facility].  He is having secondary risk reduction.  He should have an LDL in the 50s.  This was 95 last year.  I will repeat a fasting lipid profile.  LVH:  He has severe LVH on echo in 2023.  I will check a PYP scan and myeloma panel.  Further testing will be based on these.     Signed, Rollene Rotunda, MD

## 2023-03-15 ENCOUNTER — Other Ambulatory Visit: Payer: Self-pay | Admitting: *Deleted

## 2023-03-15 ENCOUNTER — Ambulatory Visit (INDEPENDENT_AMBULATORY_CARE_PROVIDER_SITE_OTHER): Payer: Medicare HMO | Admitting: Cardiology

## 2023-03-15 ENCOUNTER — Encounter: Payer: Self-pay | Admitting: Cardiology

## 2023-03-15 VITALS — BP 138/70 | HR 57 | Ht 71.0 in | Wt 193.0 lb

## 2023-03-15 DIAGNOSIS — I10 Essential (primary) hypertension: Secondary | ICD-10-CM | POA: Diagnosis not present

## 2023-03-15 DIAGNOSIS — I517 Cardiomegaly: Secondary | ICD-10-CM

## 2023-03-15 DIAGNOSIS — Z79899 Other long term (current) drug therapy: Secondary | ICD-10-CM

## 2023-03-15 DIAGNOSIS — I480 Paroxysmal atrial fibrillation: Secondary | ICD-10-CM

## 2023-03-15 DIAGNOSIS — E785 Hyperlipidemia, unspecified: Secondary | ICD-10-CM | POA: Diagnosis not present

## 2023-03-15 DIAGNOSIS — I739 Peripheral vascular disease, unspecified: Secondary | ICD-10-CM

## 2023-03-15 DIAGNOSIS — D6869 Other thrombophilia: Secondary | ICD-10-CM

## 2023-03-15 NOTE — Patient Instructions (Signed)
Medication Instructions:  The current medical regimen is effective;  continue present plan and medications.  *If you need a refill on your cardiac medications before your next appointment, please call your pharmacy*   Lab Work: Please have fasting Lipid panel and Multiple myeloma panel drawn at your closest [facility].  If you have labs (blood work) drawn today and your tests are completely normal, you will receive your results only by: MyChart Message (if you have MyChart) OR A paper copy in the mail If you have any lab test that is abnormal or we need to change your treatment, we will call you to review the results.   Testing/Procedures: You have been ordered to have an Amyloid Study.  This testing is completed at out [address] office, [address], [place], [place] There are no special instructions (the patient can eat, drink, take medications as directed).  You will be in the office for about 1.5-2 hours. An IV is started and radioactive tracer is pushed. Then there is a waiting period of an hour, then pictures take roughly 30 or so minutes.   You will be contacted to be scheduled for this testing.  Follow-Up: At [facility], you and your health needs are our priority.  As part of our continuing mission to provide you with exceptional heart care, we have created designated Provider Care Teams.  These Care Teams include your primary Cardiologist (physician) and Advanced Practice Providers (APPs -  Physician Assistants and Nurse Practitioners) who all work together to provide you with the care you need, when you need it.  We recommend signing up for the patient portal called "MyChart".  Sign up information is provided on this After Visit Summary.  MyChart is used to connect with patients for Virtual Visits (Telemedicine).  Patients are able to view lab/test results, encounter notes, upcoming appointments, etc.  Non-urgent messages can be sent to your provider as well.   To learn  more about what you can do with MyChart, go to [email or web address].    Your next appointment:   3 month(s)  Provider:   Rollene Rotunda, MD

## 2023-03-16 ENCOUNTER — Ambulatory Visit (HOSPITAL_COMMUNITY): Payer: Medicare HMO | Attending: Cardiology

## 2023-03-16 DIAGNOSIS — I517 Cardiomegaly: Secondary | ICD-10-CM | POA: Insufficient documentation

## 2023-03-16 MED ORDER — TECHNETIUM TC 99M PYROPHOSPHATE
20.5000 | Freq: Once | INTRAVENOUS | Status: AC
Start: 2023-03-16 — End: 2023-03-16
  Administered 2023-03-16: 20.5 via INTRAVENOUS

## 2023-03-17 LAB — MYOCARDIAL AMYLOID PLANAR & SPECT: H/CL Ratio: 1.09

## 2023-03-21 ENCOUNTER — Telehealth: Payer: Self-pay | Admitting: Cardiology

## 2023-03-21 DIAGNOSIS — I517 Cardiomegaly: Secondary | ICD-10-CM

## 2023-03-21 NOTE — Telephone Encounter (Signed)
Rollene Rotunda, MD 03/19/2023 12:57 PM EDT     Equivocal PYP scan.  Please schedule an MRI to look at LVH to look for suggestions of amyloid.  Call Mr. Hamre with the results and send results to [name], [name], MD   Discussed results with pt. Will order MRI per Dr. Antoine Poche. Pt verbalizes understanding. Instructions sent via Mychart.

## 2023-03-21 NOTE — Telephone Encounter (Signed)
Patient returned RN's call regarding results. 

## 2023-03-22 ENCOUNTER — Encounter: Payer: Self-pay | Admitting: Family Medicine

## 2023-03-22 ENCOUNTER — Ambulatory Visit (INDEPENDENT_AMBULATORY_CARE_PROVIDER_SITE_OTHER): Payer: Medicare HMO | Admitting: Family Medicine

## 2023-03-22 VITALS — BP 127/64 | HR 57 | Ht 71.0 in | Wt 187.0 lb

## 2023-03-22 DIAGNOSIS — E785 Hyperlipidemia, unspecified: Secondary | ICD-10-CM

## 2023-03-22 DIAGNOSIS — I10 Essential (primary) hypertension: Secondary | ICD-10-CM | POA: Diagnosis not present

## 2023-03-22 DIAGNOSIS — I517 Cardiomegaly: Secondary | ICD-10-CM

## 2023-03-22 DIAGNOSIS — R7303 Prediabetes: Secondary | ICD-10-CM

## 2023-03-22 LAB — BAYER DCA HB A1C WAIVED: HB A1C (BAYER DCA - WAIVED): 5.7 % — ABNORMAL HIGH (ref 4.8–5.6)

## 2023-03-22 MED ORDER — HYDROCHLOROTHIAZIDE 25 MG PO TABS: 25.0000 mg | ORAL_TABLET | Freq: Every day | ORAL | 3 refills | Status: DC

## 2023-03-22 NOTE — Progress Notes (Signed)
BP 127/64   Pulse (!) 57   Ht 5\' 11"  (1.803 m)   Wt 187 lb (84.8 kg)   SpO2 96%   BMI 26.08 kg/m    Subjective:   Patient ID: Tommy [name], male    DOB: Feb 27, 1951, 72 y.o.   MRN: [id number]  HPI: Tommy [name] is a 72 y.o. male presenting on 03/22/2023 for Medical Management of Chronic Issues   HPI Hypertension Patient is currently on amlodipine and metoprolol, and their blood pressure today is 127/64. Patient denies any lightheadedness or dizziness. Patient denies headaches, blurred vision, chest pains, shortness of breath, or weakness. Denies any side effects from medication and is content with current medication.   Hyperlipidemia Patient is coming in for recheck of his hyperlipidemia. The patient is currently taking fenofibrate and Crestor. They deny any issues with myalgias or history of liver damage from it. They deny any focal numbness or weakness or chest pain.   Prediabetes Patient comes in today for recheck of his diabetes. Patient has been currently taking no medicine, diet control. Patient is not currently on an ACE inhibitor/ARB. Patient has not seen an ophthalmologist this year. Patient denies any new issues with their feet. The symptom started onset as an adult hypertension and hyperlipidemia ARE RELATED TO DM   Patient has seen cardiology and under evaluation for LVH and they were concerned for possible amyloid issues.  They are ordering an MRI at some scans that could be suggestion towards it and wants [place] to do blood work and testing for that possibility.  Relevant past medical, surgical, family and social history reviewed and updated as indicated. Interim medical history since our last visit reviewed. Allergies and medications reviewed and updated.  Review of Systems  Constitutional:  Negative for chills and fever.  Eyes:  Negative for visual disturbance.  Respiratory:  Negative for shortness of breath and wheezing.   Cardiovascular:  Negative for chest pain and leg  swelling.  Musculoskeletal:  Negative for back pain and gait problem.  Skin:  Negative for rash.  Neurological:  Negative for dizziness and light-headedness.  All other systems reviewed and are negative.   Per HPI unless specifically indicated above   Allergies as of 03/22/2023       Reactions   Penicillins Swelling   Has patient had a PCN reaction causing immediate rash, facial/tongue/throat swelling, SOB or lightheadedness with hypotension: No Has patient had a PCN reaction causing severe rash involving mucus membranes or skin necrosis: No Has patient had a PCN reaction that required hospitalization No Has patient had a PCN reaction occurring within the last 10 years: No If all of the above answers are "NO", then may proceed with Cephalosporin use.        Medication List        Accurate as of March 22, 2023  8:19 AM. If you have any questions, ask your nurse or doctor.          amLODipine 10 MG tablet Commonly known as: NORVASC Take 1 tablet (10 mg total) by mouth daily.   apixaban 5 MG Tabs tablet Commonly known as: Eliquis Take 1 tablet (5 mg total) by mouth 2 (two) times daily.   aspirin EC 81 MG tablet Take 1 tablet (81 mg total) by mouth daily. Swallow whole.   fenofibrate 145 MG tablet Commonly known as: TRICOR Take 1 tablet (145 mg total) by mouth daily.   hydrochlorothiazide 25 MG tablet Commonly known as: HYDRODIURIL Take 1  tablet (25 mg total) by mouth daily.   metoprolol succinate 25 MG 24 hr tablet Commonly known as: TOPROL-XL Take 1 tablet (25 mg total) by mouth daily.   rosuvastatin 10 MG tablet Commonly known as: CRESTOR Take 1 tablet (10 mg total) by mouth every evening.   tamsulosin 0.4 MG Caps capsule Commonly known as: FLOMAX Take 1 capsule (0.4 mg total) by mouth daily after supper.         Objective:   BP 127/64   Pulse (!) 57   Ht 5\' 11"  (1.803 m)   Wt 187 lb (84.8 kg)   SpO2 96%   BMI 26.08 kg/m   Wt Readings from  Last 3 Encounters:  03/22/23 187 lb (84.8 kg)  03/16/23 193 lb (87.5 kg)  03/15/23 193 lb (87.5 kg)    Physical Exam Vitals and nursing note reviewed.  Constitutional:      General: He is not in acute distress.    Appearance: He is well-developed. He is not diaphoretic.  Eyes:     General: No scleral icterus.    Conjunctiva/sclera: Conjunctivae normal.  Neck:     Thyroid: No thyromegaly.  Cardiovascular:     Rate and Rhythm: Normal rate and regular rhythm.     Heart sounds: Normal heart sounds. No murmur heard. Pulmonary:     Effort: Pulmonary effort is normal. No respiratory distress.     Breath sounds: Normal breath sounds. No wheezing.  Musculoskeletal:        General: No swelling. Normal range of motion.     Cervical back: Neck supple.  Lymphadenopathy:     Cervical: No cervical adenopathy.  Skin:    General: Skin is warm and dry.     Findings: No rash.  Neurological:     Mental Status: He is alert and oriented to person, place, and time.     Coordination: Coordination normal.  Psychiatric:        Behavior: Behavior normal.       Assessment & Plan:   Problem List Items Addressed This Visit       Cardiovascular and Mediastinum   Essential hypertension   Relevant Medications   hydrochlorothiazide (HYDRODIURIL) 25 MG tablet   Other Relevant Orders   CBC with Differential/Platelet   CMP14+EGFR   Lipid panel   Bayer DCA Hb A1c Waived     Other   Hyperlipidemia LDL goal <130   Relevant Medications   hydrochlorothiazide (HYDRODIURIL) 25 MG tablet   Other Relevant Orders   CBC with Differential/Platelet   CMP14+EGFR   Lipid panel   Bayer DCA Hb A1c Waived   Prediabetes - Primary   Relevant Orders   CBC with Differential/Platelet   CMP14+EGFR   Lipid panel   Bayer DCA Hb A1c Waived   Other Visit Diagnoses     LVH (left ventricular hypertrophy)       Relevant Medications   hydrochlorothiazide (HYDRODIURIL) 25 MG tablet   Other Relevant Orders    Multiple Myeloma Panel (SPEP&IFE w/QIG)   UPEP/UIFE/Light Chains/TP, 24-Hr Ur       Patient had cardiac concern for LVH that they were suggesting possibly amyloid and recommended testing for multiple myeloma, has not had the test on the recommended with his blood work and Follow up plan: Return in about 6 months (around 09/21/2023), or if symptoms worsen or fail to improve, for htn and hld and prediabetes.  Counseling provided for all of the vaccine components Orders Placed This Encounter  Procedures  CBC with Differential/Platelet   CMP14+EGFR   Lipid panel   Bayer DCA Hb A1c Waived   Multiple Myeloma Panel (SPEP&IFE w/QIG)   UPEP/UIFE/Light Chains/TP, 24-Hr Ur    Arville Care, MD [facility] 03/22/2023, 8:19 AM

## 2023-03-23 LAB — CBC WITH DIFFERENTIAL/PLATELET
Lymphs: 35 %
MCH: 28.7 pg (ref 26.6–33.0)
MCHC: 33.6 g/dL (ref 31.5–35.7)
MCV: 86 fL (ref 79–97)
Monocytes Absolute: 0.3 10*3/uL (ref 0.1–0.9)
Neutrophils Absolute: 1.9 10*3/uL (ref 1.4–7.0)
Neutrophils: 53 %
Platelets: 176 10*3/uL (ref 150–450)
RDW: 12.7 % (ref 11.6–15.4)

## 2023-03-23 LAB — MULTIPLE MYELOMA PANEL, SERUM

## 2023-03-23 LAB — CMP14+EGFR
ALT: 32 IU/L (ref 0–44)
Albumin/Globulin Ratio: 2.1
BUN: 28 mg/dL — ABNORMAL HIGH (ref 8–27)
Calcium: 10.6 mg/dL — ABNORMAL HIGH (ref 8.6–10.2)
Chloride: 105 mmol/L (ref 96–106)
Creatinine, Ser: 1.06 mg/dL (ref 0.76–1.27)
Glucose: 108 mg/dL — ABNORMAL HIGH (ref 70–99)
eGFR: 74 mL/min/{1.73_m2} (ref >59)

## 2023-03-27 ENCOUNTER — Telehealth: Payer: Self-pay | Admitting: Cardiology

## 2023-03-27 MED ORDER — DIAZEPAM 5 MG PO TABS: ORAL_TABLET | ORAL | 0 refills | Status: DC

## 2023-03-27 NOTE — Telephone Encounter (Signed)
Spoke with pt, script for valium 5 mg to take 1 hour prior to MRA called to [facility] pharmacy. Patient is aware he will need a driver.

## 2023-03-27 NOTE — Telephone Encounter (Signed)
Pt called in asking if Dr. Antoine Poche can prescribe him a "muscle relaxant" before his MRI 04/11/23 because he gets jittery in closed spaces.

## 2023-03-28 ENCOUNTER — Other Ambulatory Visit: Payer: Self-pay | Admitting: Family Medicine

## 2023-03-28 ENCOUNTER — Other Ambulatory Visit: Payer: Medicare HMO

## 2023-03-28 ENCOUNTER — Other Ambulatory Visit (HOSPITAL_COMMUNITY): Payer: Self-pay | Admitting: Physician Assistant

## 2023-03-28 DIAGNOSIS — I517 Cardiomegaly: Secondary | ICD-10-CM | POA: Diagnosis not present

## 2023-03-28 LAB — CBC WITH DIFFERENTIAL/PLATELET
Basophils Absolute: 0 10*3/uL (ref 0.0–0.2)
Basos: 1 %
EOS (ABSOLUTE): 0.1 10*3/uL (ref 0.0–0.4)
Eos: 2 %
Hematocrit: 41.4 % (ref 37.5–51.0)
Hemoglobin: 13.9 g/dL (ref 13.0–17.7)
Immature Grans (Abs): 0 10*3/uL (ref 0.0–0.1)
Immature Granulocytes: 1 %
Lymphocytes Absolute: 1.3 10*3/uL (ref 0.7–3.1)
Monocytes: 8 %
RBC: 4.84 x10E6/uL (ref 4.14–5.80)
WBC: 3.6 10*3/uL (ref 3.4–10.8)

## 2023-03-28 LAB — CMP14+EGFR
AST: 27 IU/L (ref 0–40)
Albumin: 4.4 g/dL (ref 3.8–4.8)
Alkaline Phosphatase: 29 IU/L — ABNORMAL LOW (ref 44–121)
BUN/Creatinine Ratio: 26 — ABNORMAL HIGH (ref 10–24)
Bilirubin Total: 1 mg/dL (ref 0.0–1.2)
CO2: 28 mmol/L (ref 20–29)
Globulin, Total: 2.1 g/dL (ref 1.5–4.5)
Potassium: 4.8 mmol/L (ref 3.5–5.2)
Sodium: 144 mmol/L (ref 134–144)
Total Protein: 6.5 g/dL (ref 6.0–8.5)

## 2023-03-28 LAB — MULTIPLE MYELOMA PANEL, SERUM
Albumin/Glob SerPl: 1.5 (ref 0.7–1.7)
Alpha 1: 0.2 g/dL (ref 0.0–0.4)
Alpha2 Glob SerPl Elph-Mcnc: 0.6 g/dL (ref 0.4–1.0)
Globulin, Total: 2.7 g/dL (ref 2.2–3.9)
IgG (Immunoglobin G), Serum: 944 mg/dL (ref 603–1613)

## 2023-03-28 LAB — LIPID PANEL
Chol/HDL Ratio: 3 ratio (ref 0.0–5.0)
Cholesterol, Total: 109 mg/dL (ref 100–199)
HDL: 36 mg/dL — ABNORMAL LOW (ref >39)
LDL Chol Calc (NIH): 55 mg/dL (ref 0–99)
Triglycerides: 90 mg/dL (ref 0–149)
VLDL Cholesterol Cal: 18 mg/dL (ref 5–40)

## 2023-03-28 NOTE — Telephone Encounter (Signed)
Prescription refill request for Eliquis received. Indication: PAF Last office visit: 03/15/23  Tommy Nakayama MD Scr: 1.06 on 03/22/23  Epic Age: 72 Weight: 87.5kg  Based on above findings Eliquis 5mg  twice daily is the appropriate dose.  Refill approved.

## 2023-03-29 LAB — UPEP/UIFE/LIGHT CHAINS/TP, 24-HR UR
% BETA, Urine: 46.9 %
ALBUMIN, U: 25.7 %
ALPHA 1 URINE: 3.8 %
ALPHA-2-GLOBULIN, U: 13.6 %
Free Kappa Lt Chains,Ur: 2.77 mg/L (ref 1.17–86.46)
Free Lambda Lt Chains,Ur: <0.69 mg/L (ref 0.27–15.21)
GAMMA GLOBULIN URINE: 10 %
Kappa/Lambda Ratio,U: >4.01 (ref 1.83–14.26)
Protein, 24H Urine: 87 mg/24 hr (ref 30–150)
Protein, Ur: 13.4 mg/dL

## 2023-04-10 ENCOUNTER — Telehealth (HOSPITAL_COMMUNITY): Payer: Self-pay | Admitting: *Deleted

## 2023-04-10 NOTE — Telephone Encounter (Signed)
Attempted to call patient regarding upcoming cardiac MRI appointment. Left message on voicemail with name and callback number  Kieffer Blatz RN Navigator Cardiac Imaging [facility] [phone] Office [phone] Cell  

## 2023-04-11 ENCOUNTER — Other Ambulatory Visit: Payer: Self-pay | Admitting: Cardiology

## 2023-04-11 ENCOUNTER — Ambulatory Visit (HOSPITAL_COMMUNITY)
Admission: RE | Admit: 2023-04-11 | Discharge: 2023-04-11 | Disposition: A | Discharge status: Home / Self Care | Payer: Medicare HMO | Source: Ambulatory Visit | Attending: Cardiology | Admitting: Cardiology

## 2023-04-11 DIAGNOSIS — I517 Cardiomegaly: Secondary | ICD-10-CM | POA: Insufficient documentation

## 2023-04-11 MED ORDER — GADOBUTROL 1 MMOL/ML IV SOLN
10.0000 mL | Freq: Once | INTRAVENOUS | Status: AC | PRN
  Administered 2023-04-11: 10 mL via INTRAVENOUS

## 2023-04-12 ENCOUNTER — Encounter: Payer: Self-pay | Admitting: Cardiology

## 2023-04-12 NOTE — Telephone Encounter (Signed)
Left message for patient, yes he still needs to do the MRI.

## 2023-05-24 ENCOUNTER — Encounter: Payer: Self-pay | Admitting: Cardiology

## 2023-06-15 ENCOUNTER — Ambulatory Visit (INDEPENDENT_AMBULATORY_CARE_PROVIDER_SITE_OTHER): Payer: Medicare HMO

## 2023-06-15 VITALS — Ht 71.0 in | Wt 192.0 lb

## 2023-06-15 DIAGNOSIS — Z0000 Encounter for general adult medical examination without abnormal findings: Secondary | ICD-10-CM | POA: Diagnosis not present

## 2023-06-15 NOTE — Progress Notes (Signed)
Subjective:   Tommy [name] is a 72 y.o. male who presents for Medicare Annual/Subsequent preventive examination.  Visit Complete: Virtual  I connected with  Tommy [name] on 06/15/23 by a audio enabled telemedicine application and verified that I am speaking with the correct person using two identifiers.  Patient Location: Home  Provider Location: Home Office  I discussed the limitations of evaluation and management by telemedicine. The patient expressed understanding and agreed to proceed.  Patient Medicare AWV questionnaire was completed by the patient on 0/02/2023; I have confirmed that all information answered by patient is correct and no changes since this date.  Review of Systems    Vital Signs: Unable to obtain new vitals due to this being a telehealth visit.  Cardiac Risk Factors include: advanced age (>11men, >11 women);dyslipidemia;male gender;hypertension     Objective:    Today's Vitals   06/15/23 1130  Weight: 192 lb (87.1 kg)  Height: 5\' 11"  (1.803 m)   Body mass index is 26.78 kg/m.     06/15/2023   11:33 AM 06/01/2022   12:07 PM 12/29/2021   10:11 AM 04/30/2021   10:16 AM 08/04/2016    7:58 AM 08/24/2015   10:51 AM 04/23/2015    8:32 AM  Advanced Directives  Does Patient Have a Medical Advance Directive? Yes No Yes Yes No Yes Yes  Type of Estate agent of [place];Living will   Healthcare Power of [place];Living will  Healthcare Power of [place];Living will Healthcare Power of [place];Living will  Does patient want to make changes to medical advance directive?       No - Patient declined  Copy of Healthcare Power of Attorney in Chart? No - copy requested   No - copy requested  Yes No - copy requested  Would patient like information on creating a medical advance directive?  No - Guardian declined   No - patient declined information      Current Medications (verified) Outpatient Encounter Medications as of 06/15/2023  Medication Sig    amLODipine (NORVASC) 10 MG tablet Take 1 tablet (10 mg total) by mouth daily.   apixaban (ELIQUIS) 5 MG TABS tablet TAKE 1 TABLET TWICE DAILY   aspirin EC 81 MG tablet Take 1 tablet (81 mg total) by mouth daily. Swallow whole.   diazepam (VALIUM) 5 MG tablet Take 1 hour prior to MRI   fenofibrate (TRICOR) 145 MG tablet Take 1 tablet (145 mg total) by mouth daily.   hydrochlorothiazide (HYDRODIURIL) 25 MG tablet Take 1 tablet (25 mg total) by mouth daily.   metoprolol succinate (TOPROL-XL) 25 MG 24 hr tablet Take 1 tablet (25 mg total) by mouth daily.   rosuvastatin (CRESTOR) 10 MG tablet Take 1 tablet (10 mg total) by mouth every evening.   tamsulosin (FLOMAX) 0.4 MG CAPS capsule Take 1 capsule (0.4 mg total) by mouth daily after supper.   No facility-administered encounter medications on file as of 06/15/2023.    Allergies (verified) Penicillins   History: Past Medical History:  Diagnosis Date   BPH with obstruction/lower urinary tract symptoms    hyperplasia   ED (erectile dysfunction)    arterial insuff.   History of DVT of lower extremity    History of gastric ulcer    Hyperlipidemia    Hypertension    Peripheral vascular disease (HCC)    Prostate cancer (HCC) DX 01/01/15--  urologist-  dr Annabell Howells   Stage T1c, Gleason 3+4, PSA 5.73, vol 31ml   Wears  glasses    Past Surgical History:  Procedure Laterality Date   FEMORAL ARTERY STENT Left 07-22-2008  &  11-16-2010   left SFA//   In-Stent restenosis   ORIF LEFT INDEX FINGER FX  09-28-2006   PROSTATE BIOPSY  01/01/15   PROSTATE SURGERY  July 2016   Seed Implant   RADIOACTIVE SEED IMPLANT N/A 04/23/2015   Procedure: RADIOACTIVE SEED IMPLANT/BRACHYTHERAPY IMPLANT;  Surgeon: Bjorn Pippin, MD;  Location: [facility];  Service: Urology;  Laterality: N/A;   TONSILLECTOMY  as child   Family History  Problem Relation Age of Onset   Heart disease Mother    Cancer Mother    Deep vein thrombosis Mother    Diabetes Mother     Hyperlipidemia Mother    Hypertension Mother    Heart attack Mother    Peripheral vascular disease Mother    Cancer Father    Colon cancer Neg Hx    Social History   Socioeconomic History   Marital status: Married    Spouse name: Jamesetta So   Number of children: 3   Years of education: Not on file   Highest education level: Not on file  Occupational History    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  Tobacco Use   Smoking status: Former    Current packs/day: 0.00    Average packs/day: 1 pack/day for 15.0 years (15.0 ttl pk-yrs)    Types: Cigarettes    Start date: 09/09/2010    Quit date: 04/21/2011    Years since quitting: 12.1   Smokeless tobacco: Current    Types: Snuff   Tobacco comments:    occasional  someday dips tobacco  Vaping Use   Vaping status: Never Used  Substance and Sexual Activity   Alcohol use: No    Alcohol/week: 0.0 standard drinks of alcohol   Drug use: No   Sexual activity: Not on file  Other Topics Concern   Not on file  Social History Narrative   2 daughters and one son - all live nearby   Social Determinants of Health   Financial Resource Strain: Low Risk  (06/15/2023)   Overall Financial Resource Strain (CARDIA)    Difficulty of Paying Living Expenses: Not hard at all  Food Insecurity: No Food Insecurity (06/15/2023)   Hunger Vital Sign    Worried About Running Out of Food in the Last Year: Never true    Ran Out of Food in the Last Year: Never true  Transportation Needs: No Transportation Needs (06/15/2023)   PRAPARE - Administrator, Civil Service (Medical): No    Lack of Transportation (Non-Medical): No  Physical Activity: Sufficiently Active (06/15/2023)   Exercise Vital Sign    Days of Exercise per Week: 4 days    Minutes of Exercise per Session: 40 min  Stress: No Stress Concern Present (06/15/2023)   [employer] of Occupational Health - Occupational Stress Questionnaire    Feeling of Stress : Not  at all  Social Connections: Socially Integrated (06/15/2023)   Social Connection and Isolation Panel [NHANES]    Frequency of Communication with Friends and Family: More than three times a week    Frequency of Social Gatherings with Friends and Family: More than three times a week    Attends Religious Services: More than 4 times per year    Active Member of [employer] or Organizations: Yes    Attends Banker Meetings: More than 4 times per  year    Marital Status: Married    Tobacco Counseling Ready to quit: Not Answered Counseling given: Not Answered Tobacco comments: occasional  someday dips tobacco   Clinical Intake:  Pre-visit preparation completed: Yes  Pain : No/denies pain     Nutritional Risks: None Diabetes: No  How often do you need to have someone help you when you read instructions, pamphlets, or other written materials from your doctor or pharmacy?: 1 - Never  Interpreter Needed?: No  Information entered by :: Renie Ora, LPN   Activities of Daily Living    06/15/2023   11:34 AM 06/14/2023    9:15 AM  In your present state of health, do you have any difficulty performing the following activities:  Hearing? 0 1  Vision? 0 0  Difficulty concentrating or making decisions? 0 0  Walking or climbing stairs? 0 0  Dressing or bathing? 0 0  Doing errands, shopping? 0 0  Preparing Food and eating ? N N  Using the Toilet? N N  In the past six months, have you accidently leaked urine? N N  Do you have problems with loss of bowel control? N N  Managing your Medications? N N  Managing your Finances? N N  Housekeeping or managing your Housekeeping? N N    Patient Care Team: [name], [name], MD as PCP - General (Family Medicine) Lanelle Bal, DO as Consulting Physician (Internal Medicine) Bjorn Pippin, MD as Attending Physician (Urology)  Indicate any recent Medical Services you may have received from other than [facility] providers in the past year  (date may be approximate).     Assessment:   This is a routine wellness examination for Jarvaris.  Hearing/Vision screen Vision Screening - Comments:: Wears rx glasses - up to date with routine eye exams with  Dr.Barts    Goals Addressed             This Visit's Progress    Patient Stated   On track    06/01/2022 AWV Goal: Fall Prevention  Over the next year, patient will decrease their risk for falls by: Using assistive devices, such as a cane or walker, as needed Identifying fall risks within their home and correcting them by: Removing throw rugs Adding handrails to stairs or ramps Removing clutter and keeping a clear pathway throughout the home Increasing light, especially at night Adding shower handles/bars Raising toilet seat Identifying potential personal risk factors for falls: Medication side effects Incontinence/urgency Vestibular dysfunction Hearing loss Musculoskeletal disorders Neurological disorders Orthostatic hypotension         Depression Screen    06/15/2023   11:32 AM 03/22/2023    8:01 AM 12/15/2022   10:45 AM 09/27/2022   12:01 PM 08/25/2022   10:13 AM 06/15/2022    2:03 PM 06/01/2022   12:09 PM  PHQ 2/9 Scores  PHQ - 2 Score 0 0 1 0 1 2 0  PHQ- 9 Score  1 3 0  5     Fall Risk    06/15/2023   11:31 AM 06/14/2023    9:15 AM 06/02/2023    6:50 PM 03/22/2023    8:00 AM 12/15/2022   10:39 AM  Fall Risk   Falls in the past year? 0 0 0 0 0  Number falls in past yr: 0      Injury with Fall? 0 0 0    Risk for fall due to : No Fall Risks      Follow up  Falls prevention discussed        MEDICARE RISK AT HOME: Medicare Risk at Home Any stairs in or around the home?: Yes If so, are there any without handrails?: No Home free of loose throw rugs in walkways, pet beds, electrical cords, etc?: Yes Adequate lighting in your home to reduce risk of falls?: Yes Life alert?: No Use of a cane, walker or w/c?: No Grab bars in the bathroom?: Yes Shower chair  or bench in shower?: Yes Elevated toilet seat or a handicapped toilet?: Yes  TIMED UP AND GO:  Was the test performed?  No    Cognitive Function:        06/15/2023   11:34 AM 06/01/2022   12:08 PM 04/30/2021   10:16 AM  6CIT Screen  What Year? 0 points 0 points 0 points  What month? 0 points 0 points 0 points  What time? 0 points 0 points 0 points  Count back from 20 0 points 0 points 0 points  Months in reverse 0 points 0 points 0 points  Repeat phrase 0 points 0 points 0 points  Total Score 0 points 0 points 0 points    Immunizations Immunization History  Administered Date(s) Administered   Fluad Quad(high Dose 65+) 07/03/2019, 07/14/2021, 07/19/2022   Influenza Split 07/13/2013   Influenza Whole 07/12/2010   Influenza, High Dose Seasonal PF 07/05/2018   Influenza,inj,Quad PF,6+ Mos 07/23/2014, 07/20/2015, 07/07/2016, 07/26/2017   Moderna Covid-19 Vaccine Bivalent Booster 78yrs & up 08/10/2021   Moderna Sars-Covid-2 Vaccination 12/05/2019, 01/03/2020, 10/15/2020   Pneumococcal Conjugate-13 07/07/2016, 01/22/2018   Pneumococcal Polysaccharide-23 10/11/2001, 10/11/2001, 10/11/2001, 07/03/2019   Tdap 08/04/2016   Zoster Recombinant(Shingrix) 11/22/2021, 12/15/2022   Zoster, Live 08/08/2013    TDAP status: Up to date  Flu Vaccine status: Up to date  Pneumococcal vaccine status: Up to date  Covid-19 vaccine status: Completed vaccines  Qualifies for Shingles Vaccine? Yes   Zostavax completed Yes   Shingrix Completed?: Yes  Screening Tests Health Maintenance  Topic Date Due   INFLUENZA VACCINE  05/11/2023   COVID-19 Vaccine (5 - 2023-24 season) 06/11/2023   Colonoscopy  03/21/2024 (Originally 07/11/2015)   Medicare Annual Wellness (AWV)  06/14/2024   DTaP/Tdap/Td (2 - Td or Tdap) 08/04/2026   Pneumonia Vaccine 60+ Years old  Completed   Hepatitis C Screening  Completed   Zoster Vaccines- Shingrix  Completed   HPV VACCINES  Aged Out    Health  Maintenance  Health Maintenance Due  Topic Date Due   INFLUENZA VACCINE  05/11/2023   COVID-19 Vaccine (5 - 2023-24 season) 06/11/2023    Colorectal cancer screening: Referral to GI placed declined . Pt aware the office will call re: appt.  Lung Cancer Screening: (Low Dose CT Chest recommended if Age 17-80 years, 20 pack-year currently smoking OR have quit w/in 15years.) does not qualify.   Lung Cancer Screening Referral: n/a  Additional Screening:  Hepatitis C Screening: does not qualify; Completed 12/18/2015  Vision Screening: Recommended annual ophthalmology exams for early detection of glaucoma and other disorders of the eye. Is the patient up to date with their annual eye exam?  Yes  Who is the provider or what is the name of the office in which the patient attends annual eye exams? Dr.barts  If pt is not established with a provider, would they like to be referred to a provider to establish care? No .   Dental Screening: Recommended annual dental exams for proper oral hygiene   Community  Resource Referral / Chronic Care Management: CRR required this visit?  No   CCM required this visit?  No     Plan:     I have personally reviewed and noted the following in the patient's chart:   Medical and social history Use of alcohol, tobacco or illicit drugs  Current medications and supplements including opioid prescriptions. Patient is not currently taking opioid prescriptions. Functional ability and status Nutritional status Physical activity Advanced directives List of other physicians Hospitalizations, surgeries, and ER visits in previous 12 months Vitals Screenings to include cognitive, depression, and falls Referrals and appointments  In addition, I have reviewed and discussed with patient certain preventive protocols, quality metrics, and best practice recommendations. A written personalized care plan for preventive services as well as general preventive health  recommendations were provided to patient.     Lorrene Reid, LPN   4/0/9811   After Visit Summary: (MyChart) Due to this being a telephonic visit, the after visit summary with patients personalized plan was offered to patient via MyChart   Nurse Notes: none

## 2023-06-15 NOTE — Patient Instructions (Signed)
Tommy [name] , Thank you for taking time to come for your Medicare Wellness Visit. I appreciate your ongoing commitment to your health goals. Please review the following plan we discussed and let me know if I can assist you in the future.   Referrals/Orders/Follow-Ups/Clinician Recommendations: Aim for 30 minutes of exercise or brisk walking, 6-8 glasses of water, and 5 servings of fruits and vegetables each day.   This is a list of the screening recommended for you and due dates:  Health Maintenance  Topic Date Due   Flu Shot  05/11/2023   COVID-19 Vaccine (5 - 2023-24 season) 06/11/2023   Colon Cancer Screening  03/21/2024*   Medicare Annual Wellness Visit  06/14/2024   DTaP/Tdap/Td vaccine (2 - Td or Tdap) 08/04/2026   Pneumonia Vaccine  Completed   Hepatitis C Screening  Completed   Zoster (Shingles) Vaccine  Completed   HPV Vaccine  Aged Out  *Topic was postponed. The date shown is not the original due date.    Advanced directives: (Copy Requested) Please bring a copy of your health care power of attorney and living will to the office to be added to your chart at your convenience.  Next Medicare Annual Wellness Visit scheduled for next year: Yes  insert Preventive Care attachment Insert FALL PREVENTION attachment if needed

## 2023-06-18 DIAGNOSIS — I517 Cardiomegaly: Secondary | ICD-10-CM | POA: Insufficient documentation

## 2023-06-18 NOTE — Progress Notes (Unsigned)
  Cardiology Office Note:   Date:  06/18/2023  ID:  Tommy [name], DOB [birth year], MRN [id number] PCP: [name], [name], MD  [facility] Providers Cardiologist:  None {  History of Present Illness:   Tommy [name] is a 72 y.o. male who is here for his first appointment with me.  He has been seen in the atrial fibrillation clinic.  He has a history of PAD status post SFA stenting in 2009.  He is in atrial fibrillation with a 3% burden.  He has had occasional pauses of 4.5 seconds.  Has not noticed any palpitations.  He has a smart watch.   Since he was seen he has had a couple episodes of atrial fibrillation.  He thinks that these have been short-lived.  They might last about 15 minutes.  He has not recorded them on his Watch.  He has not had any presyncope or syncope.  He denies any chest pressure, neck or arm discomfort.  He has not had any slow heart rates to bother him although when he was wearing his watch occasionally he would be alerted to this.  He is active working as a Electrical engineer.    ROS: ***  Studies Reviewed:    EKG:       ***  Risk Assessment/Calculations:   {Does this patient have ATRIAL FIBRILLATION?:[id number]} No BP recorded.  {Refresh Note OR Click here to enter BP  :1}***        Physical Exam:   VS:  There were no vitals taken for this visit.   Wt Readings from Last 3 Encounters:  06/15/23 192 lb (87.1 kg)  03/22/23 187 lb (84.8 kg)  03/16/23 193 lb (87.5 kg)     GEN: Well nourished, well developed in no acute distress NECK: No JVD; No carotid bruits CARDIAC: ***RRR, no murmurs, rubs, gallops RESPIRATORY:  Clear to auscultation without rales, wheezing or rhonchi  ABDOMEN: Soft, non-tender, non-distended EXTREMITIES:  No edema; No deformity   ASSESSMENT AND PLAN:   Paroxysmal Atrial Fibrillation :  ***  (ICD10:  I48.0): ***  He has some short paroxysms.  He tolerates anticoagulation.  He has some bradycardia arrhythmias but seems to tolerate the  beta-blocker.  At this point no change in therapy.   Secondary Hypercoagulable State:  ***   (ICD10:  D68.69): He has a contraindication anticoagulation and he tolerates it and he is on the appropriate dose.  He is up-to-date with lab work.   HTN:  ***   The blood pressure is upper limits but better than recent readings.  He should keep a blood pressure diary and meds can be adjusted as needed.   PAD: ***  He is followed by [facility].  He is having secondary risk reduction.  He should have an LDL in the 50s.  This was 95 last year.  I will repeat a fasting lipid profile.   LVH:  He has severe LVH on echo in 2023.   PYP was equivocal but MRI did not suggest amyloid.  ***   I will check a PYP scan and myeloma panel.  Further testing will be based on these.        {Are you ordering a CV Procedure (e.g. stress test, cath, DCCV, TEE, etc)?   Press F2        :[id number]}  Follow up ***  Signed, Rollene Rotunda, MD

## 2023-06-21 ENCOUNTER — Ambulatory Visit: Payer: Medicare HMO | Admitting: Cardiology

## 2023-06-21 ENCOUNTER — Encounter: Payer: Self-pay | Admitting: Cardiology

## 2023-06-21 VITALS — BP 128/68 | HR 64 | Ht 71.0 in | Wt 190.0 lb

## 2023-06-21 DIAGNOSIS — I10 Essential (primary) hypertension: Secondary | ICD-10-CM | POA: Diagnosis not present

## 2023-06-21 DIAGNOSIS — I739 Peripheral vascular disease, unspecified: Secondary | ICD-10-CM

## 2023-06-21 DIAGNOSIS — I480 Paroxysmal atrial fibrillation: Secondary | ICD-10-CM | POA: Diagnosis not present

## 2023-06-21 DIAGNOSIS — I517 Cardiomegaly: Secondary | ICD-10-CM

## 2023-06-21 NOTE — Patient Instructions (Signed)

## 2023-08-09 ENCOUNTER — Inpatient Hospital Stay (HOSPITAL_COMMUNITY): Payer: Medicare HMO

## 2023-08-09 ENCOUNTER — Encounter: Payer: Self-pay | Admitting: Nurse Practitioner

## 2023-08-09 ENCOUNTER — Ambulatory Visit (INDEPENDENT_AMBULATORY_CARE_PROVIDER_SITE_OTHER): Payer: Medicare HMO | Admitting: Nurse Practitioner

## 2023-08-09 ENCOUNTER — Emergency Department (HOSPITAL_COMMUNITY): Payer: Medicare HMO

## 2023-08-09 ENCOUNTER — Other Ambulatory Visit: Payer: Self-pay

## 2023-08-09 ENCOUNTER — Inpatient Hospital Stay (HOSPITAL_COMMUNITY)
Admission: EM | Admit: 2023-08-09 | Discharge: 2023-08-11 | DRG: 309 | Disposition: A | Discharge status: Home / Self Care | Payer: Medicare HMO | Attending: Student | Admitting: Student

## 2023-08-09 VITALS — BP 134/65 | HR 54 | Temp 97.9°F | Ht 71.0 in

## 2023-08-09 DIAGNOSIS — N401 Enlarged prostate with lower urinary tract symptoms: Secondary | ICD-10-CM | POA: Diagnosis present

## 2023-08-09 DIAGNOSIS — Z833 Family history of diabetes mellitus: Secondary | ICD-10-CM

## 2023-08-09 DIAGNOSIS — Z9181 History of falling: Secondary | ICD-10-CM | POA: Diagnosis not present

## 2023-08-09 DIAGNOSIS — Z86718 Personal history of other venous thrombosis and embolism: Secondary | ICD-10-CM

## 2023-08-09 DIAGNOSIS — Z8249 Family history of ischemic heart disease and other diseases of the circulatory system: Secondary | ICD-10-CM | POA: Diagnosis not present

## 2023-08-09 DIAGNOSIS — R918 Other nonspecific abnormal finding of lung field: Secondary | ICD-10-CM | POA: Diagnosis not present

## 2023-08-09 DIAGNOSIS — Z880 Allergy status to penicillin: Secondary | ICD-10-CM | POA: Diagnosis not present

## 2023-08-09 DIAGNOSIS — Z8546 Personal history of malignant neoplasm of prostate: Secondary | ICD-10-CM

## 2023-08-09 DIAGNOSIS — D6869 Other thrombophilia: Secondary | ICD-10-CM | POA: Diagnosis present

## 2023-08-09 DIAGNOSIS — Z7982 Long term (current) use of aspirin: Secondary | ICD-10-CM

## 2023-08-09 DIAGNOSIS — G4733 Obstructive sleep apnea (adult) (pediatric): Secondary | ICD-10-CM | POA: Diagnosis present

## 2023-08-09 DIAGNOSIS — Z87891 Personal history of nicotine dependence: Secondary | ICD-10-CM | POA: Diagnosis not present

## 2023-08-09 DIAGNOSIS — Z7901 Long term (current) use of anticoagulants: Secondary | ICD-10-CM | POA: Insufficient documentation

## 2023-08-09 DIAGNOSIS — R42 Dizziness and giddiness: Secondary | ICD-10-CM | POA: Diagnosis not present

## 2023-08-09 DIAGNOSIS — Z79899 Other long term (current) drug therapy: Secondary | ICD-10-CM | POA: Diagnosis not present

## 2023-08-09 DIAGNOSIS — Z832 Family history of diseases of the blood and blood-forming organs and certain disorders involving the immune mechanism: Secondary | ICD-10-CM

## 2023-08-09 DIAGNOSIS — Z5181 Encounter for therapeutic drug level monitoring: Secondary | ICD-10-CM | POA: Insufficient documentation

## 2023-08-09 DIAGNOSIS — E785 Hyperlipidemia, unspecified: Secondary | ICD-10-CM | POA: Diagnosis not present

## 2023-08-09 DIAGNOSIS — I4891 Unspecified atrial fibrillation: Secondary | ICD-10-CM | POA: Diagnosis not present

## 2023-08-09 DIAGNOSIS — I10 Essential (primary) hypertension: Secondary | ICD-10-CM | POA: Diagnosis present

## 2023-08-09 DIAGNOSIS — I480 Paroxysmal atrial fibrillation: Secondary | ICD-10-CM

## 2023-08-09 DIAGNOSIS — I495 Sick sinus syndrome: Secondary | ICD-10-CM

## 2023-08-09 DIAGNOSIS — I2510 Atherosclerotic heart disease of native coronary artery without angina pectoris: Secondary | ICD-10-CM | POA: Diagnosis not present

## 2023-08-09 DIAGNOSIS — Z83438 Family history of other disorder of lipoprotein metabolism and other lipidemia: Secondary | ICD-10-CM

## 2023-08-09 DIAGNOSIS — I739 Peripheral vascular disease, unspecified: Secondary | ICD-10-CM | POA: Diagnosis present

## 2023-08-09 DIAGNOSIS — R55 Syncope and collapse: Principal | ICD-10-CM | POA: Diagnosis present

## 2023-08-09 DIAGNOSIS — S0990XA Unspecified injury of head, initial encounter: Secondary | ICD-10-CM | POA: Diagnosis not present

## 2023-08-09 DIAGNOSIS — Z8711 Personal history of peptic ulcer disease: Secondary | ICD-10-CM

## 2023-08-09 DIAGNOSIS — D72819 Decreased white blood cell count, unspecified: Secondary | ICD-10-CM | POA: Diagnosis present

## 2023-08-09 DIAGNOSIS — W19XXXA Unspecified fall, initial encounter: Secondary | ICD-10-CM | POA: Diagnosis not present

## 2023-08-09 DIAGNOSIS — Z809 Family history of malignant neoplasm, unspecified: Secondary | ICD-10-CM | POA: Diagnosis not present

## 2023-08-09 LAB — CBC WITH DIFFERENTIAL/PLATELET
Abs Immature Granulocytes: 0.03 10*3/uL (ref 0.00–0.07)
Basophils Absolute: 0 10*3/uL (ref 0.0–0.1)
Basophils Relative: 1 %
Eosinophils Absolute: 0.1 10*3/uL (ref 0.0–0.5)
Eosinophils Relative: 3 %
HCT: 41.6 % (ref 39.0–52.0)
Hemoglobin: 13.6 g/dL (ref 13.0–17.0)
Immature Granulocytes: 1 %
Lymphocytes Relative: 37 %
Lymphs Abs: 1.1 10*3/uL (ref 0.7–4.0)
MCH: 28.8 pg (ref 26.0–34.0)
MCHC: 32.7 g/dL (ref 30.0–36.0)
MCV: 87.9 fL (ref 80.0–100.0)
Monocytes Absolute: 0.3 10*3/uL (ref 0.1–1.0)
Monocytes Relative: 10 %
Neutro Abs: 1.5 10*3/uL — ABNORMAL LOW (ref 1.7–7.7)
Neutrophils Relative %: 48 %
Platelets: 157 10*3/uL (ref 150–400)
RBC: 4.73 MIL/uL (ref 4.22–5.81)
RDW: 12.6 % (ref 11.5–15.5)
WBC: 3 10*3/uL — ABNORMAL LOW (ref 4.0–10.5)
nRBC: 0 % (ref 0.0–0.2)

## 2023-08-09 LAB — BASIC METABOLIC PANEL
Anion gap: 9 (ref 5–15)
BUN: 21 mg/dL (ref 8–23)
CO2: 27 mmol/L (ref 22–32)
Calcium: 10 mg/dL (ref 8.9–10.3)
Chloride: 103 mmol/L (ref 98–111)
Creatinine, Ser: 1.03 mg/dL (ref 0.61–1.24)
GFR, Estimated: >60 mL/min (ref >60)
Glucose, Bld: 108 mg/dL — ABNORMAL HIGH (ref 70–99)
Potassium: 3.7 mmol/L (ref 3.5–5.1)
Sodium: 139 mmol/L (ref 135–145)

## 2023-08-09 LAB — TROPONIN I (HIGH SENSITIVITY)
Troponin I (High Sensitivity): 8 ng/L (ref <18)
Troponin I (High Sensitivity): 9 ng/L (ref <18)

## 2023-08-09 LAB — TSH: TSH: 1.341 u[IU]/mL (ref 0.350–4.500)

## 2023-08-09 LAB — MAGNESIUM: Magnesium: 2.1 mg/dL (ref 1.7–2.4)

## 2023-08-09 MED ORDER — ACETAMINOPHEN 650 MG RE SUPP: 650.0000 mg | Freq: Four times a day (QID) | RECTAL | Status: DC | PRN

## 2023-08-09 MED ORDER — ACETAMINOPHEN 325 MG PO TABS: 650.0000 mg | ORAL_TABLET | Freq: Four times a day (QID) | ORAL | Status: DC | PRN

## 2023-08-09 MED ORDER — ONDANSETRON HCL 4 MG PO TABS: 4.0000 mg | ORAL_TABLET | Freq: Four times a day (QID) | ORAL | Status: DC | PRN

## 2023-08-09 MED ORDER — POLYETHYLENE GLYCOL 3350 17 G PO PACK: 17.0000 g | PACK | Freq: Every day | ORAL | Status: DC | PRN

## 2023-08-09 MED ORDER — ONDANSETRON HCL 4 MG/2ML IJ SOLN: 4.0000 mg | Freq: Four times a day (QID) | INTRAMUSCULAR | Status: DC | PRN

## 2023-08-09 MED ORDER — LORAZEPAM 2 MG/ML IJ SOLN
1.0000 mg | Freq: Once | INTRAMUSCULAR | Status: AC
  Administered 2023-08-09: 1 mg via INTRAVENOUS
  Filled 2023-08-09: qty 1

## 2023-08-09 NOTE — Consult Note (Signed)
Cardiology Consultation   Patient ID: Tommy [name] MRN: [id number]; DOB: [birth year]  Admit date: 08/09/2023 Date of Consult: 08/09/2023  PCP:  [name], [name], MD   [facility] Providers Cardiologist:  None        Patient Profile:   Tommy [name] is a 72 y.o. male with a hx of atrial fibrillation, hypertension, PAD status post SFA stenting in 2009, DVT who is being seen 08/09/2023 for the evaluation of syncope at the request of Dr Mariea Clonts.  History of Present Illness:   Tommy [name] is a 72 year old male with the above medical history who presented to [facility], ED due to lightheadedness.  States that he has been having recent episodes of dizziness, mostly occurring in the mornings.  Yesterday he fell and hit his head against his truck but denies loss of consciousness.  In the ED at [facility], he had an 8-second pause on telemetry, became unresponsive during episode.  Most recent vital signs notable for pulse 51, BP 176/83, SpO2 97% on room air.  Labs notable for creatinine 1.03, troponin 8 > 9, hemoglobin 13.6, platelets 157, WBC 3.  He was transferred from [facility] to [facility] for EP evaluation for pacemaker.  He established care with Dr. Antoine Poche on 06/2023, previously had followed in Afib clinic.  Most recent echo 10/2022 showed EF 65 to 70%, severe LVH, grade 1 diastolic dysfunction, hyperdynamic RV function, no significant valvular disease.  He had an equivocal PYP scan 03/2023.  Underwent cardiac MRI 04/2023 which showed no evidence of cardiac amyloid, LVEF 67%, RVEF 55%, no LGE.  He had a cardiac monitor 06/2022 which showed 3% A-fib burden with average rate 124 bpm, also had 12 pauses with longest lasting 4.5 seconds.  Appears after monitor he was started on Toprol-XL as well as Eliquis.  He was seen in A-fib clinic but did not see EP.  He denies any chest pain or dyspnea.   Past Medical History:  Diagnosis Date   BPH with obstruction/lower urinary tract symptoms     hyperplasia   ED (erectile dysfunction)    arterial insuff.   History of DVT of lower extremity    History of gastric ulcer    Hyperlipidemia    Hypertension    Peripheral vascular disease (HCC)    Prostate cancer (HCC) DX 01/01/15--  urologist-  dr Annabell Howells   Stage T1c, Gleason 3+4, PSA 5.73, vol 31ml   Wears glasses     Past Surgical History:  Procedure Laterality Date   FEMORAL ARTERY STENT Left 07-22-2008  &  11-16-2010   left SFA//   In-Stent restenosis   ORIF LEFT INDEX FINGER FX  09-28-2006   PROSTATE BIOPSY  01/01/15   PROSTATE SURGERY  July 2016   Seed Implant   RADIOACTIVE SEED IMPLANT N/A 04/23/2015   Procedure: RADIOACTIVE SEED IMPLANT/BRACHYTHERAPY IMPLANT;  Surgeon: Bjorn Pippin, MD;  Location: [facility] [place];  Service: Urology;  Laterality: N/A;   TONSILLECTOMY  as child       Inpatient Medications: Scheduled Meds:  Continuous Infusions:  PRN Meds: acetaminophen **OR** acetaminophen, ondansetron **OR** ondansetron (ZOFRAN) IV, polyethylene glycol  Allergies:    Allergies  Allergen Reactions   Penicillins Swelling    Social History:   Social History   Socioeconomic History   Marital status: Married    Spouse name: Tommy [name]   Number of children: 3   Years of education: Not on file   Highest education level: Not on  file  Occupational History    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, advertising  Tobacco Use   Smoking status: Former    Current packs/day: 0.00    Average packs/day: 1 pack/day for 15.0 years (15.0 ttl pk-yrs)    Types: Cigarettes    Start date: 09/09/2010    Quit date: 04/21/2011    Years since quitting: 12.3   Smokeless tobacco: Current    Types: Snuff   Tobacco comments:    occasional  someday dips tobacco  Vaping Use   Vaping status: Never Used  Substance and Sexual Activity   Alcohol use: No    Alcohol/week: 0.0 standard drinks of alcohol   Drug use: No   Sexual activity: Not on file  Other Topics  Concern   Not on file  Social History Narrative   2 daughters and one son - all live nearby   Social Determinants of Health   Financial Resource Strain: Low Risk  (06/15/2023)   Overall Financial Resource Strain (CARDIA)    Difficulty of Paying Living Expenses: Not hard at all  Food Insecurity: No Food Insecurity (06/15/2023)   Hunger Vital Sign    Worried About Running Out of Food in the Last Year: Never true    Ran Out of Food in the Last Year: Never true  Transportation Needs: No Transportation Needs (06/15/2023)   PRAPARE - Administrator, Civil Service (Medical): No    Lack of Transportation (Non-Medical): No  Physical Activity: Sufficiently Active (06/15/2023)   Exercise Vital Sign    Days of Exercise per Week: 4 days    Minutes of Exercise per Session: 40 min  Stress: No Stress Concern Present (06/15/2023)   [employer] of Occupational Health - Occupational Stress Questionnaire    Feeling of Stress : Not at all  Social Connections: Socially Integrated (06/15/2023)   Social Connection and Isolation Panel [NHANES]    Frequency of Communication with Friends and Family: More than three times a week    Frequency of Social Gatherings with Friends and Family: More than three times a week    Attends Religious Services: More than 4 times per year    Active Member of [employer] or Organizations: Yes    Attends Engineer, structural: More than 4 times per year    Marital Status: Married  Catering manager Violence: Not At Risk (06/15/2023)   Humiliation, Afraid, Rape, and Kick questionnaire    Fear of Current or Ex-Partner: No    Emotionally Abused: No    Physically Abused: No    Sexually Abused: No    Family History:    Family History  Problem Relation Age of Onset   Heart disease Mother    Cancer Mother    Deep vein thrombosis Mother    Diabetes Mother    Hyperlipidemia Mother    Hypertension Mother    Heart attack Mother    Peripheral vascular disease Mother     Cancer Father    Colon cancer Neg Hx      ROS:  Please see the history of present illness.   All other ROS reviewed and negative.     Physical Exam/Data:   Vitals:   08/09/23 1445 08/09/23 1700 08/09/23 1715 08/09/23 1839  BP: 126/79 123/79 123/79 (!) 176/83  Pulse: (!) 57 (!) 54 (!) 54 (!) 51  Resp: (!) 27 (!) 22 (!) 23 (!) 22  Temp: 97.7 F (36.5 C)  97.8 F (36.6  C) 97.8 F (36.6 C)  TempSrc: Oral  Oral Oral  SpO2: 94% 93% 95% 97%  Weight:      Height:       No intake or output data in the 24 hours ending 08/09/23 1859    08/09/2023    9:19 AM 06/21/2023   11:07 AM 06/15/2023   11:30 AM  Last 3 Weights  Weight (lbs) 190 lb 0.6 oz 190 lb 192 lb  Weight (kg) 86.2 kg 86.183 kg 87.091 kg     Body mass index is 26.5 kg/m.  General:   in no acute distress HEENT: normal Neck: no JVD Cardiac:  normal S1, S2; RRR; no murmur  Lungs:  clear to auscultation bilaterally, no wheezing, rhonchi or rales  Abd: soft, nontender, no hepatomegaly  Ext: no edema Musculoskeletal:  No deformities, BUE and BLE strength normal and equal Skin: warm and dry  Neuro:  no focal abnormalities noted Psych:  Normal affect   EKG:  The EKG was personally reviewed and demonstrates: Sinus rhythm, PAC, rate 55 Telemetry:  Telemetry was personally reviewed and demonstrates:  SB in 28s  Relevant CV Studies:   Laboratory Data:  High Sensitivity Troponin:   Recent Labs  Lab 08/09/23 0926 08/09/23 1434  TROPONINIHS 8 9     Chemistry Recent Labs  Lab 08/09/23 0930  NA 139  K 3.7  CL 103  CO2 27  GLUCOSE 108*  BUN 21  CREATININE 1.03  CALCIUM 10.0  GFRNONAA >60  ANIONGAP 9    No results for input(s): "PROT", "ALBUMIN", "AST", "ALT", "ALKPHOS", "BILITOT" in the last 168 hours. Lipids No results for input(s): "CHOL", "TRIG", "HDL", "LABVLDL", "LDLCALC", "CHOLHDL" in the last 168 hours.  Hematology Recent Labs  Lab 08/09/23 0926  WBC 3.0*  RBC 4.73  HGB 13.6  HCT 41.6  MCV  87.9  MCH 28.8  MCHC 32.7  RDW 12.6  PLT 157   Thyroid No results for input(s): "TSH", "FREET4" in the last 168 hours.  BNPNo results for input(s): "BNP", "PROBNP" in the last 168 hours.  DDimer No results for input(s): "DDIMER" in the last 168 hours.   Radiology/Studies:  CT Head Wo Contrast  Result Date: 08/09/2023 CLINICAL DATA:  Provided history: Head trauma, minor. Additional history: Fall (with head trauma). Dizziness. EXAM: CT HEAD WITHOUT CONTRAST TECHNIQUE: Contiguous axial images were obtained from the base of the skull through the vertex without intravenous contrast. RADIATION DOSE REDUCTION: This exam was performed according to the departmental dose-optimization program which includes automated exposure control, adjustment of the mA and/or kV according to patient size and/or use of iterative reconstruction technique. COMPARISON:  Head CT 08/04/2016. FINDINGS: Brain: Mild generalized parenchymal volume loss. A small chronic lacunar infarct is questioned within the right basal ganglia (series 2, image 18). There is no acute intracranial hemorrhage. No demarcated cortical infarct. No extra-axial fluid collection. No evidence of an intracranial mass. No midline shift. Vascular: No hyperdense vessel.  Atherosclerotic calcifications. Skull: No calvarial fracture or aggressive osseous lesion. Sinuses/Orbits: No mass or acute finding within the imaged orbits. 14 mm mucous retention cyst, and mild background mucosal thickening, within the right maxillary sinus. Minimal mucosal thickening within the left maxillary sinus and within bilateral ethmoid air cells. IMPRESSION: 1.  No evidence of an acute intracranial abnormality. 2. Possible small chronic lacunar infarct within the right basal. 3. Mild generalized parenchymal volume loss. 4. Paranasal sinus disease as described. Electronically Signed   By: Jackey Loge D.O.  On: 08/09/2023 16:37     Assessment and Plan:   Syncope: presented in Afib  to ED today, subsequently had 8-second postconversion pause for which he had syncopal episode.   Appears tachybrady as had cardiac monitor 06/2022 which showed 3% A-fib burden with average rate 124 bpm, also had postconversion pauses at that time lasting up to 4.5 seconds; appears despite pauses was started on toprol XL at that time.  Last dose of metoprolol and Eliquis was this morning.  Cardiac MRI 04/11/23 showed LVEF 67%, RVEF 55%, no LGE -Hold metoprolol -Hold Eliquis -Given tachybrady with Afib with RVR but postconversion pause up to 8 seconds for which patient had syncopal episode, recommend EP consult tomorrow for pacemaker  Hypertension: On Toprol-XL, amlodipine, HCTZ at home.  Hold Toprol-XL as above  PAD: Status post SFA stenting in 2009.  Continue aspirin, statin  Hyperlipidemia: On rosuvastatin 10 mg daily  For questions or updates, please contact [facility] Please consult [email or web address] for contact info under    Signed, Little Ishikawa, MD  08/09/2023 6:59 PM

## 2023-08-09 NOTE — Assessment & Plan Note (Signed)
Status post left SFA stenting 2009.

## 2023-08-09 NOTE — ED Notes (Signed)
Pt confirms he is claustrophobic and that "I just wont get a CT scan if yall wont give me medicine bc I am really claustrophobic." Explained to pt thjat CT and MRI are different as he stated he has had an MRI, explained CT is more open and does not enclose you in that of the way MRI does. Pt reiterated that he "wont get the scan unless we give me medicine" --PA-C made aware

## 2023-08-09 NOTE — ED Provider Notes (Signed)
[facility] AT [facility] Provider Note   CSN: [id number] Arrival date & time: 08/09/23  9518     History  Chief Complaint  Patient presents with   Tommy [name]    Tommy [name] is a 72 y.o. male.  Patient complains of episodes of becoming very dizzy.  Patient had a dizzy episode today and passed out hitting his head.  Patient has a past medical history of atrial fibrillation he is currently on Eliquis and Toprol.  Denies any chest pain he denies any headache.  Patient does not have any symptoms prior to sudden onset of dizziness and feeling like he is going to blackout.  Has a past medical history of hypertension hyperlipidemia peripheral vascular disease coronary artery disease atrial fib.  Patient is followed by Dr. Antoine Poche.  The history is provided by the patient. No language interpreter was used.  Fall Pertinent negatives include no chest pain.       Home Medications Prior to Admission medications   Medication Sig Start Date End Date Taking? Authorizing Provider  amLODipine (NORVASC) 10 MG tablet Take 1 tablet (10 mg total) by mouth daily. 12/15/22   [name], [name], MD  apixaban (ELIQUIS) 5 MG TABS tablet TAKE 1 TABLET TWICE DAILY 03/28/23   Rollene Rotunda, MD  aspirin EC 81 MG tablet Take 1 tablet (81 mg total) by mouth daily. Swallow whole. 09/14/22 09/14/23  Fenton, Clint R, PA  fenofibrate (TRICOR) 145 MG tablet Take 1 tablet (145 mg total) by mouth daily. 12/15/22   [name], [name], MD  hydrochlorothiazide (HYDRODIURIL) 25 MG tablet Take 1 tablet (25 mg total) by mouth daily. 03/22/23   [name], [name], MD  metoprolol succinate (TOPROL-XL) 25 MG 24 hr tablet Take 1 tablet (25 mg total) by mouth daily. 08/25/22   [name], [name], MD  rosuvastatin (CRESTOR) 10 MG tablet Take 1 tablet (10 mg total) by mouth every evening. 12/15/22   [name], [name], MD  tamsulosin (FLOMAX) 0.4 MG CAPS capsule Take 1 capsule (0.4 mg total) by mouth daily  after supper. 12/15/22   [name], [name], MD      Allergies    Penicillins    Review of Systems   Review of Systems  Cardiovascular:  Negative for chest pain and palpitations.  All other systems reviewed and are negative.   Physical Exam Updated Vital Signs BP (!) 144/81 (BP Location: Left Arm)   Pulse (!) 57   Temp 97.8 F (36.6 C) (Oral)   Resp 19   Ht 5\' 11"  (1.803 m)   Wt 86.2 kg   SpO2 93%   BMI 26.50 kg/m  Physical Exam Vitals and nursing note reviewed.  Constitutional:      Appearance: Normal appearance. He is well-developed.  HENT:     Head: Normocephalic and atraumatic.     Nose: Nose normal.     Mouth/Throat:     Mouth: Mucous membranes are moist.  Cardiovascular:     Rate and Rhythm: Regular rhythm.  Pulmonary:     Effort: Pulmonary effort is normal.  Abdominal:     General: Abdomen is flat. There is no distension.  Musculoskeletal:        General: Normal range of motion.     Cervical back: Normal range of motion.  Skin:    General: Skin is warm.  Neurological:     Mental Status: He is alert and oriented to person, place, and time.  Psychiatric:  Mood and Affect: Mood normal.        Judgment: Judgment normal.     ED Results / Procedures / Treatments   Labs (all labs ordered are listed, but only abnormal results are displayed) Labs Reviewed  BASIC METABOLIC PANEL - Abnormal; Notable for the following components:      Result Value   Glucose, Bld 108 (*)    All other components within normal limits  CBC WITH DIFFERENTIAL/PLATELET - Abnormal; Notable for the following components:   WBC 3.0 (*)    Neutro Abs 1.5 (*)    All other components within normal limits  TROPONIN I (HIGH SENSITIVITY)  TROPONIN I (HIGH SENSITIVITY)    EKG None  Radiology No results found.  Procedures .Critical Care  Performed by: Elson Areas, PA-C Authorized by: Elson Areas, PA-C   Critical care provider statement:    Critical care time  (minutes):  30   Critical care start time:  08/09/2023 10:00 AM   Critical care end time:  08/09/2023 4:16 PM   Critical care time was exclusive of:  Separately billable procedures and treating other patients and teaching time   Critical care was necessary to treat or prevent imminent or life-threatening deterioration of the following conditions:  Cardiac failure and circulatory failure   Critical care was time spent personally by me on the following activities:  Blood draw for specimens, ordering and performing treatments and interventions, examination of patient, evaluation of patient's response to treatment, discussions with primary provider and pulse oximetry   Care discussed with: admitting provider   Comments:     Pt had 8 second an 8 second pause.  See EKG's in epic note images.       Medications Ordered in ED Medications  LORazepam (ATIVAN) injection 1 mg (1 mg Intravenous Given 08/09/23 1411)    ED Course/ Medical Decision Making/ A&P                                 Medical Decision Making Patient had a syncopal episode today.  Patient reports he has recently been experiencing dizziness.  Amount and/or Complexity of Data Reviewed Independent Historian:     Details: Patient's wife and daughter are here. External Data Reviewed: notes.    Details: Cardiology notes reviewed. Labs: ordered. Decision-making details documented in ED Course.    Details: Labs ordered reviewed and interpreted Radiology: ordered. Discussion of management or test interpretation with external provider(s):  I discussed patient with Dr. [facility] cardiology.  He advised patient needs to be admitted by hospitalist to [facility] with a consult to EP to see patient for a maker placement. Hospitalist consulted and will admit                 Final Clinical Impression(s) / ED Diagnoses Final diagnoses:  Syncope, unspecified syncope type  Atrial fibrillation, unspecified type (HCC)   Tachy-brady syndrome [facility])    Rx / DC Orders ED Discharge Orders     None         Osie Cheeks 08/09/23 1618    Bethann Berkshire, MD 08/09/23 1720

## 2023-08-09 NOTE — H&P (Addendum)
History and Physical    Tommy [name] OZH:[id number] DOB: 06-04-1951 DOA: 08/09/2023  PCP: [name], [name], MD   Patient coming from: Home  I have personally briefly reviewed patient's old medical records in [facility]  Chief Complaint: Dizziness  HPI: Tommy [name] is a 72 y.o. male with medical history significant for hypertension, peripheral vascular disease, BPH,DVT, atrial fibrillation on chronic anticoagulation with Eliquis. Patient presented to the ED with complaints of dizziness that started about 5 days ago, reported dizziness was present mostly in the mornings.  Yesterday he fell hitting his head against his truck.  He denies losing consciousness prior to ED visit today.  No chest pain no difficulty breathing.   ED Course: Heart rate 54-104.  Respirate rate 18-27.  Blood pressure systolic 120s to 629B.  O2 sat 93 to 95% on room air. In the ED and 8-second pause was captured on telemetry, part of it can be seen in the media tab.  Patient became unresponsive.  Subsequently regained sinus rhythm and was back to his baseline mental status. Several EKGs in the ED mostly EKG shows sinus rhythm, normal PR interval, show EKG with atrial fibrillation rate of 105. Head CT-no acute abnormality.  Possible small chronic lacunar infarct within right basal. EDP talked to cardiologist Dr. Cindra Eves recommended admission to [facility], hospitalist to consult EP.  Review of Systems: As per HPI all other systems reviewed and negative.  Past Medical History:  Diagnosis Date   BPH with obstruction/lower urinary tract symptoms    hyperplasia   ED (erectile dysfunction)    arterial insuff.   History of DVT of lower extremity    History of gastric ulcer    Hyperlipidemia    Hypertension    Peripheral vascular disease (HCC)    Prostate cancer (HCC) DX 01/01/15--  urologist-  dr Annabell Howells   Stage T1c, Gleason 3+4, PSA 5.73, vol 31ml   Wears glasses     Past Surgical History:  Procedure  Laterality Date   FEMORAL ARTERY STENT Left 07-22-2008  &  11-16-2010   left SFA//   In-Stent restenosis   ORIF LEFT INDEX FINGER FX  09-28-2006   PROSTATE BIOPSY  01/01/15   PROSTATE SURGERY  July 2016   Seed Implant   RADIOACTIVE SEED IMPLANT N/A 04/23/2015   Procedure: RADIOACTIVE SEED IMPLANT/BRACHYTHERAPY IMPLANT;  Surgeon: Bjorn Pippin, MD;  Location: [facility];  Service: Urology;  Laterality: N/A;   TONSILLECTOMY  as child     reports that he quit smoking about 12 years ago. His smoking use included cigarettes. He started smoking about 12 years ago. He has a 15 pack-year smoking history. His smokeless tobacco use includes snuff. He reports that he does not drink alcohol and does not use drugs.  Allergies  Allergen Reactions   Penicillins Swelling    Has patient had a PCN reaction causing immediate rash, facial/tongue/throat swelling, SOB or lightheadedness with hypotension: No Has patient had a PCN reaction causing severe rash involving mucus membranes or skin necrosis: No Has patient had a PCN reaction that required hospitalization No Has patient had a PCN reaction occurring within the last 10 years: No If all of the above answers are "NO", then may proceed with Cephalosporin use.    Family History  Problem Relation Age of Onset   Heart disease Mother    Cancer Mother    Deep vein thrombosis Mother    Diabetes Mother    Hyperlipidemia Mother  Hypertension Mother    Heart attack Mother    Peripheral vascular disease Mother    Cancer Father    Colon cancer Neg Hx     Prior to Admission medications   Medication Sig Start Date End Date Taking? Authorizing Provider  amLODipine (NORVASC) 10 MG tablet Take 1 tablet (10 mg total) by mouth daily. 12/15/22   [name], [name], MD  apixaban (ELIQUIS) 5 MG TABS tablet TAKE 1 TABLET TWICE DAILY 03/28/23   Rollene Rotunda, MD  aspirin EC 81 MG tablet Take 1 tablet (81 mg total) by mouth daily. Swallow whole. 09/14/22  09/14/23  Fenton, Clint R, PA  fenofibrate (TRICOR) 145 MG tablet Take 1 tablet (145 mg total) by mouth daily. 12/15/22   [name], [name], MD  hydrochlorothiazide (HYDRODIURIL) 25 MG tablet Take 1 tablet (25 mg total) by mouth daily. 03/22/23   [name], [name], MD  metoprolol succinate (TOPROL-XL) 25 MG 24 hr tablet Take 1 tablet (25 mg total) by mouth daily. 08/25/22   [name], [name], MD  rosuvastatin (CRESTOR) 10 MG tablet Take 1 tablet (10 mg total) by mouth every evening. 12/15/22   [name], [name], MD  tamsulosin (FLOMAX) 0.4 MG CAPS capsule Take 1 capsule (0.4 mg total) by mouth daily after supper. 12/15/22   [name], [name], MD    Physical Exam: Vitals:   08/09/23 0919 08/09/23 1334 08/09/23 1336 08/09/23 1345  BP:   (!) 150/82 (!) 144/81  Pulse:   (!) 59 (!) 57  Resp:   18 19  Temp:  97.8 F (36.6 C) 97.9 F (36.6 C) 97.8 F (36.6 C)  TempSrc:  Oral Oral Oral  SpO2:   94% 93%  Weight: 86.2 kg     Height: 5\' 11"  (1.803 m)       Constitutional: NAD, calm, comfortable Vitals:   08/09/23 0919 08/09/23 1334 08/09/23 1336 08/09/23 1345  BP:   (!) 150/82 (!) 144/81  Pulse:   (!) 59 (!) 57  Resp:   18 19  Temp:  97.8 F (36.6 C) 97.9 F (36.6 C) 97.8 F (36.6 C)  TempSrc:  Oral Oral Oral  SpO2:   94% 93%  Weight: 86.2 kg     Height: 5\' 11"  (1.803 m)      Eyes: PERRL, lids and conjunctivae normal ENMT: Mucous membranes are moist.  Neck: normal, supple, no masses, no thyromegaly Respiratory: clear to auscultation bilaterally, no wheezing, no crackles. Normal respiratory effort. No accessory muscle use.  Cardiovascular: Bradycardic, regular rate and rhythm, no murmurs / rubs / gallops. No extremity edema.  Extremities warm.  Abdomen: no tenderness, no masses palpated. No hepatosplenomegaly. Bowel sounds positive.  Musculoskeletal: no clubbing / cyanosis. No joint deformity upper and lower extremities.  Skin: no rashes, lesions, ulcers. No  induration Neurologic: No facial asymmetry, moving extremities spontaneously, speech fluent Psychiatric: Normal judgment and insight. Alert and oriented x 3. Normal mood.   Labs on Admission: I have personally reviewed following labs and imaging studies  CBC: Recent Labs  Lab 08/09/23 0926  WBC 3.0*  NEUTROABS 1.5*  HGB 13.6  HCT 41.6  MCV 87.9  PLT 157   Basic Metabolic Panel: Recent Labs  Lab 08/09/23 0930  NA 139  K 3.7  CL 103  CO2 27  GLUCOSE 108*  BUN 21  CREATININE 1.03  CALCIUM 10.0   Urine analysis:    Component Value Date/Time   APPEARANCEUR Clear 06/02/2021 1506   GLUCOSEU Negative 06/02/2021 1506  BILIRUBINUR Negative 06/02/2021 1506   PROTEINUR Negative 06/02/2021 1506   NITRITE Negative 06/02/2021 1506   LEUKOCYTESUR Negative 06/02/2021 1506    Radiological Exams on Admission: No results found.  EKG: Independently reviewed.  Initial EKG with atrial fibrillation rate 105, subsequent EKG shows sinus rhythm, PACs.  Assessment/Plan Principal Problem:   Syncope Active Problems:   PAD (peripheral artery disease) (HCC)   Essential hypertension   Paroxysmal atrial fibrillation (HCC)   Dizziness, nonspecific   Assessment and Plan: * Syncope Several dizzy episodes over the past 5 days, syncopal episode witnessed in the ED, patient became unresponsive, with telemetry showing 8-second pause, (in media tab).  Troponin 8 > 9.  Prior cardiology notes-patient has had occasional pauses of 4.5 seconds.  Last Echo- 10/2022-  65 to 70%, grade 1 DD. - Last Eliquis dose was this morning. - EDP asked to Dr. Diona Browner cardiology, recommend admission to [facility] pacemaker, hold Eliquis and metoprolol. -N.p.o. midnight -Chest x-ray -Reported fall, head CT negative for acute abnormality -Check TSH, magnesium - Reached out to cardiology at [facility] via secure chat for evaluation-Dr. Schumann's team will see patient in consult.  Paroxysmal atrial fibrillation  (HCC) Initial EKG showing atrial fibrillation with rate of 105, currently in sinus rhythm. -Hold metoprolol and Eliquis  Essential hypertension Stable. -Resume HCTZ, Norvasc -Hold metoprolol with sinus pause  PAD (peripheral artery disease) (HCC) Status post left SFA stenting 2009.   DVT prophylaxis: SCDS Code Status: Full code Family Communication: None at bedside Disposition Plan:  > 2 days Consults called:  Cardiology Admission status: Inpt Stepdown I certify that at the point of admission it is my clinical judgment that the patient will require inpatient hospital care spanning beyond 2 midnights from the point of admission due to high intensity of service, high risk for further deterioration and high frequency of surveillance required.    Author: Onnie Boer, MD 08/09/2023 7:27 PM  For on call review [email or web address].

## 2023-08-09 NOTE — ED Notes (Signed)
Patient transported to CT 

## 2023-08-09 NOTE — ED Notes (Signed)
Pt ripping off cardiac monitor leads, wants to go home

## 2023-08-09 NOTE — Progress Notes (Signed)
Established Patient Office Visit  Subjective   Patient ID: Tommy [name], male    DOB: 20-Oct-1950  Age: 72 y.o. MRN: [id number]  Chief Complaint  Patient presents with   Dizziness    Gets really dizzy in the mornings, fell yesterday, only happens in the mornings has been going on for 4 days  Dizziness started last Friday  HPI Tommy [name] is a 72 yrs old male with PMH HTN, hyperlipidemia, CAD, Afib, hx of DVT, adenocarcinamo, here today fro an acute for recent fall and dizziness. Dizziness started last Friday "I woke up get dress, eat breakfast and drive to work and as I am getting out of my truck start experiencing dizziness". He works as Writer. Fell yesterday hit head against truck, denies LOC. He reports experiencing dizziness this morning " I told my wife to call and make an appointment because I don't feel like myself". He had to use a w/c to the back of the clinic because he was very unsteady on his feet. Report HA 5/6, dizziness, syncope. He is on Eliquis    Patient Active Problem List   Diagnosis Date Noted   Current use of long term anticoagulation 08/09/2023   History of recent fall 08/09/2023   Dizziness, nonspecific 08/09/2023   Left ventricular hypertrophy 06/18/2023   Paroxysmal atrial fibrillation (HCC) 09/14/2022   Hypercoagulable state due to paroxysmal atrial fibrillation (HCC) 09/14/2022   History of colonic polyps 12/16/2020   CAD (coronary artery disease) 07/03/2019   GERD without esophagitis 07/03/2019   Prediabetes 07/05/2018   Overweight (BMI 25.0-29.9) 05/09/2018   Hyperlipidemia LDL goal <130 05/18/2015   Stage T1c adenocarcinoma of the prostate with a Gleason's Score of 3+4 and a PSA of 5.73 02/01/2015   Peripheral vascular disease, unspecified (HCC) 06/27/2012   PAD (peripheral artery disease) (HCC) 02/09/2011   Essential hypertension 02/09/2011   Past Medical History:  Diagnosis Date   BPH with obstruction/lower urinary tract symptoms     hyperplasia   ED (erectile dysfunction)    arterial insuff.   History of DVT of lower extremity    History of gastric ulcer    Hyperlipidemia    Hypertension    Peripheral vascular disease (HCC)    Prostate cancer (HCC) DX 01/01/15--  urologist-  dr Annabell Howells   Stage T1c, Gleason 3+4, PSA 5.73, vol 31ml   Wears glasses    Past Surgical History:  Procedure Laterality Date   FEMORAL ARTERY STENT Left 07-22-2008  &  11-16-2010   left SFA//   In-Stent restenosis   ORIF LEFT INDEX FINGER FX  09-28-2006   PROSTATE BIOPSY  01/01/15   PROSTATE SURGERY  July 2016   Seed Implant   RADIOACTIVE SEED IMPLANT N/A 04/23/2015   Procedure: RADIOACTIVE SEED IMPLANT/BRACHYTHERAPY IMPLANT;  Surgeon: Bjorn Pippin, MD;  Location: [facility];  Service: Urology;  Laterality: N/A;   TONSILLECTOMY  as child   Social History   Tobacco Use   Smoking status: Former    Current packs/day: 0.00    Average packs/day: 1 pack/day for 15.0 years (15.0 ttl pk-yrs)    Types: Cigarettes    Start date: 09/09/2010    Quit date: 04/21/2011    Years since quitting: 12.3   Smokeless tobacco: Current    Types: Snuff   Tobacco comments:    occasional  someday dips tobacco  Vaping Use   Vaping status: Never Used  Substance Use Topics   Alcohol use: No  Alcohol/week: 0.0 standard drinks of alcohol   Drug use: No   Social History   Socioeconomic History   Marital status: Married    Spouse name: Jamesetta So   Number of children: 3   Years of education: Not on file   Highest education level: Not on file  Occupational History    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  Tobacco Use   Smoking status: Former    Current packs/day: 0.00    Average packs/day: 1 pack/day for 15.0 years (15.0 ttl pk-yrs)    Types: Cigarettes    Start date: 09/09/2010    Quit date: 04/21/2011    Years since quitting: 12.3   Smokeless tobacco: Current    Types: Snuff   Tobacco comments:    occasional   someday dips tobacco  Vaping Use   Vaping status: Never Used  Substance and Sexual Activity   Alcohol use: No    Alcohol/week: 0.0 standard drinks of alcohol   Drug use: No   Sexual activity: Not on file  Other Topics Concern   Not on file  Social History Narrative   2 daughters and one son - all live nearby   Social Determinants of Health   Financial Resource Strain: Low Risk  (06/15/2023)   Overall Financial Resource Strain (CARDIA)    Difficulty of Paying Living Expenses: Not hard at all  Food Insecurity: No Food Insecurity (06/15/2023)   Hunger Vital Sign    Worried About Running Out of Food in the Last Year: Never true    Ran Out of Food in the Last Year: Never true  Transportation Needs: No Transportation Needs (06/15/2023)   PRAPARE - Administrator, Civil Service (Medical): No    Lack of Transportation (Non-Medical): No  Physical Activity: Sufficiently Active (06/15/2023)   Exercise Vital Sign    Days of Exercise per Week: 4 days    Minutes of Exercise per Session: 40 min  Stress: No Stress Concern Present (06/15/2023)   [employer] of Occupational Health - Occupational Stress Questionnaire    Feeling of Stress : Not at all  Social Connections: Socially Integrated (06/15/2023)   Social Connection and Isolation Panel [NHANES]    Frequency of Communication with Friends and Family: More than three times a week    Frequency of Social Gatherings with Friends and Family: More than three times a week    Attends Religious Services: More than 4 times per year    Active Member of [employer] or Organizations: Yes    Attends Engineer, structural: More than 4 times per year    Marital Status: Married  Catering manager Violence: Not At Risk (06/15/2023)   Humiliation, Afraid, Rape, and Kick questionnaire    Fear of Current or Ex-Partner: No    Emotionally Abused: No    Physically Abused: No    Sexually Abused: No   Family Status  Relation Name Status   Mother   Deceased   Father  Deceased   Neg Hx  (Not Specified)  No partnership data on file   Family History  Problem Relation Age of Onset   Heart disease Mother    Cancer Mother    Deep vein thrombosis Mother    Diabetes Mother    Hyperlipidemia Mother    Hypertension Mother    Heart attack Mother    Peripheral vascular disease Mother    Cancer Father    Colon cancer Neg Hx  Allergies  Allergen Reactions   Penicillins Swelling    Has patient had a PCN reaction causing immediate rash, facial/tongue/throat swelling, SOB or lightheadedness with hypotension: No Has patient had a PCN reaction causing severe rash involving mucus membranes or skin necrosis: No Has patient had a PCN reaction that required hospitalization No Has patient had a PCN reaction occurring within the last 10 years: No If all of the above answers are "NO", then may proceed with Cephalosporin use.      Review of Systems  Constitutional:  Negative for fever.  HENT:  Negative for ear pain.   Eyes:  Negative for pain.  Respiratory:  Negative for cough and wheezing.   Cardiovascular:  Negative for chest pain and leg swelling.  Gastrointestinal:  Negative for nausea and vomiting.  Genitourinary:  Negative for frequency and hematuria.  Musculoskeletal:  Positive for falls.       Fell yesterday hit his head  Neurological:  Positive for dizziness and headaches.   Negative unless indicated in HPI   Objective:     BP 134/65   Pulse (!) 54   Temp 97.9 F (36.6 C) (Temporal)   Ht 5\' 11"  (1.803 m)   SpO2 97%   BMI 26.50 kg/m  BP Readings from Last 3 Encounters:  08/09/23 134/65  06/21/23 128/68  03/22/23 127/64   Wt Readings from Last 3 Encounters:  06/21/23 190 lb (86.2 kg)  06/15/23 192 lb (87.1 kg)  03/22/23 187 lb (84.8 kg)      Physical Exam Vitals and nursing note reviewed.  Constitutional:      Appearance: Normal appearance.  HENT:     Head: Normocephalic and atraumatic.  Eyes:     General:  No scleral icterus.    Extraocular Movements: Extraocular movements intact.     Conjunctiva/sclera: Conjunctivae normal.     Pupils: Pupils are equal, round, and reactive to light.  Cardiovascular:     Rate and Rhythm: Normal rate and regular rhythm.  Pulmonary:     Effort: Pulmonary effort is normal.     Breath sounds: Normal breath sounds.  Musculoskeletal:        General: Normal range of motion.     Right lower leg: No edema.     Left lower leg: No edema.  Skin:    General: Skin is warm and dry.     Findings: No rash.  Neurological:     Mental Status: He is alert and oriented to person, place, and time.     Motor: Weakness present.     Gait: Gait abnormal.  Psychiatric:        Mood and Affect: Mood normal.        Behavior: Behavior normal.        Thought Content: Thought content normal.        Judgment: Judgment normal.    No results found for any visits on 08/09/23.  Last CBC Lab Results  Component Value Date   WBC 3.6 03/22/2023   HGB 13.9 03/22/2023   HCT 41.4 03/22/2023   MCV 86 03/22/2023   MCH 28.7 03/22/2023   RDW 12.7 03/22/2023   PLT 176 03/22/2023   Last metabolic panel Lab Results  Component Value Date   GLUCOSE 108 (H) 03/22/2023   NA 144 03/22/2023   K 4.8 03/22/2023   CL 105 03/22/2023   CO2 28 03/22/2023   BUN 28 (H) 03/22/2023   CREATININE 1.06 03/22/2023   EGFR 74 03/22/2023   CALCIUM  10.6 (H) 03/22/2023   PROT 6.5 03/22/2023   ALBUMIN 4.4 03/22/2023   LABGLOB 2.1 03/22/2023   LABGLOB 2.7 03/22/2023   AGRATIO 2.1 03/22/2023   BILITOT 1.0 03/22/2023   ALKPHOS 29 (L) 03/22/2023   AST 27 03/22/2023   ALT 32 03/22/2023   ANIONGAP 5 04/16/2015   Last lipids Lab Results  Component Value Date   CHOL 109 03/22/2023   HDL 36 (L) 03/22/2023   LDLCALC 55 03/22/2023   TRIG 90 03/22/2023   CHOLHDL 3.0 03/22/2023   Last hemoglobin A1c Lab Results  Component Value Date   HGBA1C 5.7 (H) 03/22/2023   Last thyroid functions Lab Results   Component Value Date   TSH 0.961 07/03/2019   T4TOTAL 8.3 07/03/2019        Assessment & Plan:  History of recent fall  Current use of long term anticoagulation  Dizziness, nonspecific  Hypercoagulable state due to paroxysmal atrial fibrillation (HCC)  Johnpaul is 72 yrs old Caucasian male Since he on Eliquis and experiencing HA, EMS called he will transported to [facility] For further evaluation to r/o brain bleed  No follow-ups on file.    Arrie Aran Santa Lighter, DNP [facility] [address] [place], [place] [address] [phone]

## 2023-08-09 NOTE — Assessment & Plan Note (Signed)
Initial EKG showing atrial fibrillation with rate of 105, currently in sinus rhythm. -Hold metoprolol and Eliquis

## 2023-08-09 NOTE — ED Notes (Signed)
Pts wife and daughter made aware of pts plan of care to be transported to [facility]

## 2023-08-09 NOTE — ED Notes (Signed)
Pt appears to still be restless, ripping cardiac monitor lead off, taking gown off, attempting to get up, difficult to redirect

## 2023-08-09 NOTE — Assessment & Plan Note (Addendum)
Stable. -Resume HCTZ, Norvasc -Hold metoprolol with sinus pause

## 2023-08-09 NOTE — ED Notes (Signed)
[facility] here to transport pt to [facility] ?

## 2023-08-09 NOTE — ED Triage Notes (Signed)
Pt arrrived via [facility] from [facility] c/o a fall that happened yesterday, fell and hit his head, denies LOC, pt is on blood thinners--takes eliquis and aspirin daily. States he has been dizzy the past several days, does have a Hx of a fib, denies CP, denies SOB A&Ox4

## 2023-08-09 NOTE — Assessment & Plan Note (Addendum)
Several dizzy episodes over the past 5 days, syncopal episode witnessed in the ED, patient became unresponsive, with telemetry showing 8-second pause, (in media tab).  Troponin 8 > 9.  Prior cardiology notes-patient has had occasional pauses of 4.5 seconds.  Last Echo- 10/2022-  65 to 70%, grade 1 DD. - Last Eliquis dose was this morning. - EDP asked to Dr. Diona Browner cardiology, recommend admission to [facility] for pacemaker, hold Eliquis and metoprolol. -N.p.o. midnight -Chest x-ray -Reported fall, head CT negative for acute abnormality -Check TSH, magnesium - Reached out to cardiology at [facility] via secure chat for evaluation-Dr. Schumann's team will see patient in consult.

## 2023-08-10 DIAGNOSIS — I739 Peripheral vascular disease, unspecified: Secondary | ICD-10-CM

## 2023-08-10 DIAGNOSIS — R55 Syncope and collapse: Secondary | ICD-10-CM | POA: Diagnosis not present

## 2023-08-10 DIAGNOSIS — I480 Paroxysmal atrial fibrillation: Secondary | ICD-10-CM | POA: Diagnosis not present

## 2023-08-10 DIAGNOSIS — I4891 Unspecified atrial fibrillation: Secondary | ICD-10-CM | POA: Diagnosis not present

## 2023-08-10 DIAGNOSIS — I495 Sick sinus syndrome: Secondary | ICD-10-CM

## 2023-08-10 LAB — CBC
HCT: 37.5 % — ABNORMAL LOW (ref 39.0–52.0)
Hemoglobin: 12.8 g/dL — ABNORMAL LOW (ref 13.0–17.0)
MCH: 29 pg (ref 26.0–34.0)
MCHC: 34.1 g/dL (ref 30.0–36.0)
MCV: 84.8 fL (ref 80.0–100.0)
Platelets: 156 10*3/uL (ref 150–400)
RBC: 4.42 MIL/uL (ref 4.22–5.81)
RDW: 12.5 % (ref 11.5–15.5)
WBC: 3.6 10*3/uL — ABNORMAL LOW (ref 4.0–10.5)
nRBC: 0 % (ref 0.0–0.2)

## 2023-08-10 LAB — BASIC METABOLIC PANEL
Anion gap: 6 (ref 5–15)
BUN: 17 mg/dL (ref 8–23)
CO2: 24 mmol/L (ref 22–32)
Calcium: 9.7 mg/dL (ref 8.9–10.3)
Chloride: 109 mmol/L (ref 98–111)
Creatinine, Ser: 1.07 mg/dL (ref 0.61–1.24)
GFR, Estimated: >60 mL/min (ref >60)
Glucose, Bld: 86 mg/dL (ref 70–99)
Potassium: 3.9 mmol/L (ref 3.5–5.1)
Sodium: 139 mmol/L (ref 135–145)

## 2023-08-10 MED ORDER — ASPIRIN 81 MG PO TBEC
81.0000 mg | DELAYED_RELEASE_TABLET | Freq: Every day | ORAL | Status: DC
Start: 2023-08-10 — End: 2023-08-11
  Administered 2023-08-10 – 2023-08-11 (×2): 81 mg via ORAL
  Filled 2023-08-10 (×2): qty 1

## 2023-08-10 MED ORDER — AMIODARONE HCL 200 MG PO TABS
400.0000 mg | ORAL_TABLET | Freq: Two times a day (BID) | ORAL | Status: DC
  Administered 2023-08-10 – 2023-08-11 (×2): 400 mg via ORAL
  Filled 2023-08-10 (×2): qty 2

## 2023-08-10 MED ORDER — ROSUVASTATIN CALCIUM 5 MG PO TABS
10.0000 mg | ORAL_TABLET | Freq: Every evening | ORAL | Status: DC
  Administered 2023-08-10: 10 mg via ORAL
  Filled 2023-08-10: qty 2

## 2023-08-10 MED ORDER — AMLODIPINE BESYLATE 10 MG PO TABS
10.0000 mg | ORAL_TABLET | Freq: Every day | ORAL | Status: DC
  Administered 2023-08-10 – 2023-08-11 (×2): 10 mg via ORAL
  Filled 2023-08-10 (×2): qty 1

## 2023-08-10 MED ORDER — LOSARTAN POTASSIUM 25 MG PO TABS
25.0000 mg | ORAL_TABLET | Freq: Every day | ORAL | Status: DC
  Administered 2023-08-10: 25 mg via ORAL
  Filled 2023-08-10: qty 1

## 2023-08-10 MED ORDER — APIXABAN 5 MG PO TABS
5.0000 mg | ORAL_TABLET | Freq: Two times a day (BID) | ORAL | Status: DC
  Administered 2023-08-10 – 2023-08-11 (×2): 5 mg via ORAL
  Filled 2023-08-10 (×2): qty 1

## 2023-08-10 MED ORDER — TAMSULOSIN HCL 0.4 MG PO CAPS
0.4000 mg | ORAL_CAPSULE | Freq: Every day | ORAL | Status: DC
  Administered 2023-08-10: 0.4 mg via ORAL
  Filled 2023-08-10: qty 1

## 2023-08-10 MED ORDER — HYDROCHLOROTHIAZIDE 25 MG PO TABS
25.0000 mg | ORAL_TABLET | Freq: Every day | ORAL | Status: DC
  Administered 2023-08-10: 25 mg via ORAL
  Filled 2023-08-10: qty 1

## 2023-08-10 MED ORDER — AMIODARONE HCL 200 MG PO TABS: 200.0000 mg | ORAL_TABLET | Freq: Every day | ORAL | Status: DC

## 2023-08-10 MED ORDER — AMIODARONE HCL 200 MG PO TABS: 200.0000 mg | ORAL_TABLET | Freq: Two times a day (BID) | ORAL | Status: DC

## 2023-08-10 NOTE — TOC Initial Note (Signed)
Transition of Care [facility]) - Initial/Assessment Note    Patient Details  Name: Tommy [name] MRN: [id number] Date of Birth: 10-Jul-1951  Transition of Care [facility]) CM/SW Contact:    Leone Haven, RN Phone Number: 08/10/2023, 5:06 PM  Clinical Narrative:                 From home with spouse, has PCP and insurance on file, states has no HH services in place at this time or DME at home.  States daughter , Lorene Dy, will transport him  home at [employer] and family is support system, states gets medications from [facility].  Pta self ambulatory. Await PT eval.  Expected Discharge Plan: Home w Home Health Services Barriers to Discharge: Continued Medical Work up   Patient Goals and CMS Choice Patient states their goals for this hospitalization and ongoing recovery are:: return home   Choice offered to / list presented to : NA      Expected Discharge Plan and Services In-house Referral: NA Discharge Planning Services: CM Consult Post Acute Care Choice: NA Living arrangements for the past 2 months: Single Family Home                 DME Arranged: N/A DME Agency: NA       HH Arranged: NA          Prior Living Arrangements/Services Living arrangements for the past 2 months: Single Family Home Lives with:: Spouse Patient language and need for interpreter reviewed:: Yes Do you feel safe going back to the place where you live?: Yes      Need for Family Participation in Patient Care: Yes (Comment) Care giver support system in place?: Yes (comment)   Criminal Activity/Legal Involvement Pertinent to Current Situation/Hospitalization: No - Comment as needed  Activities of Daily Living      Permission Sought/Granted Permission sought to share information with : Case Manager Permission granted to share information with : Yes, Verbal Permission Granted              Emotional Assessment Appearance:: Appears stated age Attitude/Demeanor/Rapport: Engaged Affect  (typically observed): Appropriate Orientation: : Oriented to Self, Oriented to Place, Oriented to  Time, Oriented to Situation   Psych Involvement: No (comment)  Admission diagnosis:  Syncope [R55] Tachy-brady syndrome (HCC) [I49.5] Syncope, unspecified syncope type [R55] Atrial fibrillation, unspecified type [facility]) [I48.91] Patient Active Problem List   Diagnosis Date Noted   Current use of long term anticoagulation 08/09/2023   History of recent fall 08/09/2023   Dizziness, nonspecific 08/09/2023   Syncope 08/09/2023   Tachy-brady syndrome (HCC) 08/09/2023   Left ventricular hypertrophy 06/18/2023   Atrial fibrillation (HCC) 09/14/2022   Hypercoagulable state due to paroxysmal atrial fibrillation (HCC) 09/14/2022   History of colonic polyps 12/16/2020   CAD (coronary artery disease) 07/03/2019   GERD without esophagitis 07/03/2019   Prediabetes 07/05/2018   Overweight (BMI 25.0-29.9) 05/09/2018   Hyperlipidemia LDL goal <130 05/18/2015   Stage T1c adenocarcinoma of the prostate with a Gleason's Score of 3+4 and a PSA of 5.73 02/01/2015   Peripheral vascular disease, unspecified (HCC) 06/27/2012   PAD (peripheral artery disease) (HCC) 02/09/2011   Essential hypertension 02/09/2011   PCP:  [name], [name], MD Pharmacy:   [facility] [place], [place] - [address] [place] [address] Phone: [phone] Fax: [phone]  [facility] - [place], [place] - [address] [phone] [address] [place] [address] Phone: [phone] Fax: [phone]     Social Determinants of Health (SDOH) Social History: SDOH Screenings   Food Insecurity: No Food Insecurity (06/15/2023)  Housing: Low Risk  (06/15/2023)  Transportation Needs: No Transportation Needs (06/15/2023)  Utilities: Not At Risk (06/15/2023)  Alcohol Screen: Low Risk  (06/15/2023)  Depression (PHQ2-9): Low Risk  (08/09/2023)  Financial Resource Strain:  Low Risk  (06/15/2023)  Physical Activity: Sufficiently Active (06/15/2023)  Social Connections: Socially Integrated (06/15/2023)  Stress: No Stress Concern Present (06/15/2023)  Tobacco Use: High Risk (08/09/2023)  Health Literacy: Adequate Health Literacy (06/15/2023)   SDOH Interventions:     Readmission Risk Interventions     No data to display

## 2023-08-10 NOTE — Progress Notes (Addendum)
ELECTROPHYSIOLOGY CONSULT NOTE    Patient ID: Tommy [name] MRN: [id number], DOB/AGE: 1951-04-27 72 y.o.  Admit date: 08/09/2023 Date of Consult: 08/10/2023  Primary Physician: [name], [name], MD Primary Cardiologist: Rollene Rotunda, MD  Electrophysiologist: Dr. Elberta Fortis   Referring Provider: Dr. Izora Ribas   Patient Profile: Tommy [name] is a 72 y.o. male with a history of AF, HTN, PAD s/p SFA stenting 2009, DVT who is being seen today for the evaluation of syncope at the request of Dr. Izora Ribas.  HPI:  Tommy [name] is a 72 y.o. male who presented to [facility] on 10/30 with reports of lightheadedness and recent morning episodes of dizziness.    He was previously followed in the AF Clinic. Cardiac monitor in 2023 showed 3% AF burden with average rate of 124bpm, 12 pauses with longest lasting 4.5 seconds. After his monitor, he was started on Toprol XL and eliquis. ECHO 10/2022 LVEF 65-70%, no RWMA, severe concentric LVH, G1DD, RV systolic function is hyperdynamic, LA mildly dilated. Cardiac MRI 04/2023 showed no evidence of cardiac amyloid, LVEF 67%, no LGE.    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 He went to get out of his truck and felt a little off balance, sliding out of the seat and swiped his face against the back frame of the car door. Denies passing out. He states he normally has episodes 1x per month of lightheadedness and Tommy [name] and it Tommy [name] resolve.   In the ER at [facility], he was witnessed to have pauses on telemetry and reportedly became unresponsive during the episode. The patient does not remember passing out.    He denies chest pain, palpitations, dyspnea, PND, orthopnea, nausea, vomiting, dizziness, syncope, edema, weight gain, or early satiety.   Labs Potassium3.9 (10/31 0340) Magnesium  2.1 (10/30 1937) Creatinine, ser  1.07 (10/31 0340) PLT  156 (10/31 0340) HGB  12.8* (10/31 0340) WBC 3.6* (10/31 0340) Troponin I (High  Sensitivity)9 (10/30 1434).    Past Medical History:  Diagnosis Date   BPH with obstruction/lower urinary tract symptoms    hyperplasia   ED (erectile dysfunction)    arterial insuff.   History of DVT of lower extremity    History of gastric ulcer    Hyperlipidemia    Hypertension    Peripheral vascular disease (HCC)    Prostate cancer (HCC) DX 01/01/15--  urologist-  dr Annabell Howells   Stage T1c, Gleason 3+4, PSA 5.73, vol 31ml   Wears glasses      Surgical History:  Past Surgical History:  Procedure Laterality Date   FEMORAL ARTERY STENT Left 07-22-2008  &  11-16-2010   left SFA//   In-Stent restenosis   ORIF LEFT INDEX FINGER FX  09-28-2006   PROSTATE BIOPSY  01/01/15   PROSTATE SURGERY  July 2016   Seed Implant   RADIOACTIVE SEED IMPLANT N/A 04/23/2015   Procedure: RADIOACTIVE SEED IMPLANT/BRACHYTHERAPY IMPLANT;  Surgeon: Bjorn Pippin, MD;  Location[facility];  Service: Urology;  Laterality: N/A;   TONSILLECTOMY  as child     Medications Prior to Admission  Medication Sig Dispense Refill Last Dose   amLODipine (NORVASC) 10 MG tablet Take 1 tablet (10 mg total) by mouth daily. 90 tablet 3 08/08/2023   apixaban (ELIQUIS) 5 MG TABS tablet TAKE 1 TABLET TWICE DAILY 180 tablet 1 08/08/2023 at 0500   aspirin EC 81 MG tablet Take 1 tablet (81 mg total) by  mouth daily. Swallow whole. 150 tablet 2 08/08/2023   fenofibrate (TRICOR) 145 MG tablet Take 1 tablet (145 mg total) by mouth daily. 90 tablet 3 08/08/2023   hydrochlorothiazide (HYDRODIURIL) 25 MG tablet Take 1 tablet (25 mg total) by mouth daily. 90 tablet 3 08/08/2023   Ibuprofen (ADVIL PO) Take 2 tablets by mouth daily as needed.   Past Week   metoprolol succinate (TOPROL-XL) 25 MG 24 hr tablet Take 1 tablet (25 mg total) by mouth daily. 90 tablet 3 08/08/2023   rosuvastatin (CRESTOR) 10 MG tablet Take 1 tablet (10 mg total) by mouth every evening. 90 tablet 3 08/08/2023   tamsulosin (FLOMAX) 0.4 MG CAPS capsule Take  1 capsule (0.4 mg total) by mouth daily after supper. 90 capsule 3 08/08/2023    Inpatient Medications:   amLODipine  10 mg Oral Daily    Allergies:  Allergies  Allergen Reactions   Penicillins Swelling    Family History  Problem Relation Age of Onset   Heart disease Mother    Cancer Mother    Deep vein thrombosis Mother    Diabetes Mother    Hyperlipidemia Mother    Hypertension Mother    Heart attack Mother    Peripheral vascular disease Mother    Cancer Father    Colon cancer Neg Hx      Physical Exam: Vitals:   08/09/23 1947 08/09/23 2334 08/10/23 0400 08/10/23 0800  BP: (!) 148/70 (!) 159/82 (!) 154/77 (!) 180/78  Pulse: 60  (!) 56 (!) 58  Resp: (!) 22 18 20 18   Temp: 98 F (36.7 C) 98.6 F (37 C) 98.3 F (36.8 C) 98.3 F (36.8 C)  TempSrc: Oral Oral Axillary Oral  SpO2: 93% 93% 94% 95%  Weight:   85.9 kg   Height:        GEN- NAD, A&O x 3, normal affect HEENT: Normocephalic, atraumatic Lungs- CTAB, Normal effort.  Heart- Regular rate and rhythm, No M/G/R.  GI- Soft, NT, ND.  Extremities- No clubbing, cyanosis, or edema   Radiology/Studies: DG CHEST PORT 1 VIEW  Result Date: 08/09/2023 CLINICAL DATA:  Syncope EXAM: PORTABLE CHEST 1 VIEW COMPARISON:  02/26/2015 FINDINGS: Shallow inspiration. Mild linear atelectasis or fibrosis in the lung bases. No pleural effusions or pneumothorax. Mediastinal contours appear intact. Heart size is normal for technique. Degenerative changes in the spine and shoulders. IMPRESSION: Shallow inspiration with linear atelectasis or fibrosis in the lung bases. Electronically Signed   By: Burman Nieves M.D.   On: 08/09/2023 22:59   CT Head Wo Contrast  Result Date: 08/09/2023 CLINICAL DATA:  Provided history: Head trauma, minor. Additional history: Fall (with head trauma). Dizziness. EXAM: CT HEAD WITHOUT CONTRAST TECHNIQUE: Contiguous axial images were obtained from the base of the skull through the vertex without  intravenous contrast. RADIATION DOSE REDUCTION: This exam was performed according to the departmental dose-optimization program which includes automated exposure control, adjustment of the mA and/or kV according to patient size and/or use of iterative reconstruction technique. COMPARISON:  Head CT 08/04/2016. FINDINGS: Brain: Mild generalized parenchymal volume loss. A small chronic lacunar infarct is questioned within the right basal ganglia (series 2, image 18). There is no acute intracranial hemorrhage. No demarcated cortical infarct. No extra-axial fluid collection. No evidence of an intracranial mass. No midline shift. Vascular: No hyperdense vessel.  Atherosclerotic calcifications. Skull: No calvarial fracture or aggressive osseous lesion. Sinuses/Orbits: No mass or acute finding within the imaged orbits. 14 mm mucous retention cyst, and mild  background mucosal thickening, within the right maxillary sinus. Minimal mucosal thickening within the left maxillary sinus and within bilateral ethmoid air cells. IMPRESSION: 1.  No evidence of an acute intracranial abnormality. 2. Possible small chronic lacunar infarct within the right basal. 3. Mild generalized parenchymal volume loss. 4. Paranasal sinus disease as described. Electronically Signed   By: Jackey Loge D.O.   On: 08/09/2023 16:37    EKG: (personally reviewed) 08/09/23 0918 AF 08/09/23 1438 AF with pause at end of EKG  08/09/23 1441 Conversion to SR 08/10/23 SB 58 bpm    TELEMETRY:  [facility] ER tele > AF with 2 episodes of post  (personally reviewed)  10/31 review > no AF overnight, remains in SB-SR  Assessment/Plan:  Paroxysmal Atrial Fibrillation  Post Conversion Pause with Questionable Syncope  Tachy-Brady Syndrome CHA2DS2-VASc 3 / 3.2% annual risk CVA.  TSH wnl. 2023 LTM with 3% burden of AF, known 4 second pauses.   -reviewed management strategies with patient to include antiarrhythmic drugs vs device.  He would not be a candidate for  flecainide due to known PAD.  Tikosyn would be an option to hopefully keep him in SR and avoid AF with subsequent pauses.  He had hydrochlorothiazide last on 10/29 at 6pm.  He would need 72 hour wash out before Tikosyn could be initiated. Also discussed PPM with patient and he would prefer to try medications first before PPM implant.  -another option would be amiodarone as a bridge to ablation  -unable to find 8 sec pause in telemetry review of [facility]  -continue tele monitoring   Secondary Hypercoagulable State -hold eliquis for now until seen by EP MD  HTN  -per Cardiolgoy   PAD  -per [facility]  Suspected OSA  -Tommy [name] need outpatient PSG       For questions or updates, please contact [facility] Please consult [email or web address] for contact info under Cardiology/STEMI.  Signed, Tommy Brim, MSN, APRN, NP-C, AGACNP-BC [facility] - Electrophysiology  08/10/2023, 9:26 AM   I have seen and examined this patient with Tommy [name].  Agree with above, note added to reflect my findings.  Patient with a past medical history as above.  He presented to the ER with atrial fibrillation.  He had an apparent unresponsive episode and an 8-second pause on telemetry.  Currently he feels well.  He is in sinus rhythm.  He prior to coming to the emergency room, he fell and hit his head against his truck but had no loss of consciousness.  GEN: Well nourished, well developed, in no acute distress  HEENT: normal  Neck: no JVD, carotid bruits, or masses Cardiac: RRR; no murmurs, rubs, or gallops,no edema  Respiratory:  clear to auscultation bilaterally, normal work of breathing GI: soft, nontender, nondistended, + BS MS: no deformity or atrophy  Skin: warm and dry Neuro:  Strength and sensation are intact Psych: euthymic mood, full affect   Paroxysmal atrial fibrillation: Has postconversion pauses.  Likely has some sinus node dysfunction.  He would benefit from rhythm control.  Cordaro Mukai start him on  amiodarone for rhythm control and plan for outpatient ablation.  Rupa Lagan M. Gaines Cartmell MD 08/10/2023 2:53 PM

## 2023-08-10 NOTE — Progress Notes (Signed)
PHARMACY - ANTICOAGULATION CONSULT NOTE  Pharmacy Consult for apixaban  Indication: atrial fibrillation  Allergies  Allergen Reactions   Penicillins Swelling    Patient Measurements: Height: 5\' 11"  (180.3 cm) Weight: 85.9 kg (189 lb 6 oz) IBW/kg (Calculated) : 75.3   Vital Signs: Temp: 98.3 F (36.8 C) (10/31 1000) Temp Source: Oral (10/31 1000) BP: 165/78 (10/31 1000) Pulse Rate: 60 (10/31 1000)  Labs: Recent Labs    08/09/23 0926 08/09/23 0930 08/09/23 1434 08/10/23 0340  HGB 13.6  --   --  12.8*  HCT 41.6  --   --  37.5*  PLT 157  --   --  156  CREATININE  --  1.03  --  1.07  TROPONINIHS 8  --  9  --     Estimated Creatinine Clearance: 64.5 mL/min (by C-G formula based on SCr of 1.07 mg/dL).   Medical History: Past Medical History:  Diagnosis Date   BPH with obstruction/lower urinary tract symptoms    hyperplasia   ED (erectile dysfunction)    arterial insuff.   History of DVT of lower extremity    History of gastric ulcer    Hyperlipidemia    Hypertension    Peripheral vascular disease (HCC)    Prostate cancer (HCC) DX 01/01/15--  urologist-  dr Annabell Howells   Stage T1c, Gleason 3+4, PSA 5.73, vol 31ml   Wears glasses      Assessment: 72yom with Hx Afib on metoprolol and apixaban pta.  He is admitted with near syncope, fell, hit head on truck.  Apixaban initially held, head CT negative for bleeding> EP planning to start amiodarone for rhythm control Discussed  - ok to resume apixaban Age < 80 cr < 1.5 wt > 60kg   Goal of Therapy:  Monitor platelets by anticoagulation protocol: Yes   Plan:  Apixaban 5mg  BID Monitor s/s bleeding    Leota Sauers Pharm.D. CPP, BCPS Clinical Pharmacist [phone] 08/10/2023 3:05 PM

## 2023-08-10 NOTE — Progress Notes (Signed)
Progress Note  Patient Name: ZAID MAISON Date of Encounter: 08/10/2023 Primary Cardiologist: Rollene Rotunda, MD   Subjective   Overnight notes no further symptoms. Patient notes that he has palpitations with atrial fibrillation. He notes a sinking feeling with the post conversion pauses; he did not hit his head during his fall. His wife tells him he snores but is no apneic.  Vital Signs    Vitals:   08/09/23 1839 08/09/23 1947 08/09/23 2334 08/10/23 0400  BP: (!) 176/83 (!) 148/70 (!) 159/82 (!) 154/77  Pulse: (!) 51 60  (!) 56  Resp: (!) 22 (!) 22 18 20   Temp: 97.8 F (36.6 C) 98 F (36.7 C) 98.6 F (37 C) 98.3 F (36.8 C)  TempSrc: Oral Oral Oral Axillary  SpO2: 97% 93% 93% 94%  Weight:    85.9 kg  Height:        Intake/Output Summary (Last 24 hours) at 08/10/2023 0746 Last data filed at 08/09/2023 2300 Gross per 24 hour  Intake 240 ml  Output 350 ml  Net -110 ml   Filed Weights   08/09/23 0919 08/10/23 0400  Weight: 86.2 kg 85.9 kg    Physical Exam   GEN: No acute distress.   Neck: No JVD Cardiac: regular bradycardia, no murmurs, rubs, or gallops.  Respiratory: Clear to auscultation bilaterally. GI: Soft, nontender, non-distended  MS: No edema, he is missing several partial digits  Labs   Telemetry: Sinus bradycardia   Chemistry Recent Labs  Lab 08/09/23 0930 08/10/23 0340  NA 139 139  K 3.7 3.9  CL 103 109  CO2 27 24  GLUCOSE 108* 86  BUN 21 17  CREATININE 1.03 1.07  CALCIUM 10.0 9.7  GFRNONAA >60 >60  ANIONGAP 9 6     Hematology Recent Labs  Lab 08/09/23 0926 08/10/23 0340  WBC 3.0* 3.6*  RBC 4.73 4.42  HGB 13.6 12.8*  HCT 41.6 37.5*  MCV 87.9 84.8  MCH 28.8 29.0  MCHC 32.7 34.1  RDW 12.6 12.5  PLT 157 156    Cardiac EnzymesNo results for input(s): "TROPONINI" in the last 168 hours. No results for input(s): "TROPIPOC" in the last 168 hours.   BNPNo results for input(s): "BNP", "PROBNP" in the last 168 hours.    DDimer No results for input(s): "DDIMER" in the last 168 hours.   Cardiac Studies   Cardiac Studies & Procedures       ECHOCARDIOGRAM  ECHOCARDIOGRAM COMPLETE 10/11/2022  Narrative ECHOCARDIOGRAM REPORT    Patient Name:   TYWAN WAWRO Tomkinson Date of Exam: 10/11/2022 Medical Rec #:  [id number]   Height:       70.0 in Accession #:    [id number]  Weight:       188.0 lb Date of Birth:  October 09, 1951   BSA:          2.033 m Patient Age:    72 years    BP:           163/79 mmHg Patient Gender: M           HR:           65 bpm. Exam Location:  Outpatient  Procedure: 2D Echo, Cardiac Doppler, Color Doppler and Intracardiac Opacification Agent  Indications:    Atrial fibrillation  History:        Patient has no prior history of Echocardiogram examinations. PAD; Risk Factors:Hypertension and Dyslipidemia.  Sonographer:    Ross Ludwig RDCS (AE) Referring Phys: [phone] CLINT  R FENTON   Sonographer Comments: Technically difficult study due to poor echo windows. IMPRESSIONS   1. Left ventricular ejection fraction, by estimation, is 65 to 70%. The left ventricle has normal function. The left ventricle has no regional wall motion abnormalities. There is severe concentric left ventricular hypertrophy. Left ventricular diastolic parameters are consistent with Grade I diastolic dysfunction (impaired relaxation). 2. Right ventricular systolic function is hyperdynamic. The right ventricular size is normal. Tricuspid regurgitation signal is inadequate for assessing PA pressure. 3. Left atrial size was mildly dilated. 4. The mitral valve is normal in structure. No evidence of mitral valve regurgitation. No evidence of mitral stenosis. Moderate mitral annular calcification. 5. The aortic valve is tricuspid. There is mild calcification of the aortic valve. Aortic valve regurgitation is not visualized. Aortic valve sclerosis/calcification is present, without any evidence of aortic stenosis.  Comparison(s):  No prior Echocardiogram.  FINDINGS Left Ventricle: Left ventricular ejection fraction, by estimation, is 65 to 70%. The left ventricle has normal function. The left ventricle has no regional wall motion abnormalities. Definity contrast agent was given IV to delineate the left ventricular endocardial borders. The left ventricular internal cavity size was small. There is severe concentric left ventricular hypertrophy. Left ventricular diastolic parameters are consistent with Grade I diastolic dysfunction (impaired relaxation).  Right Ventricle: The right ventricular size is normal. No increase in right ventricular wall thickness. Right ventricular systolic function is hyperdynamic. Tricuspid regurgitation signal is inadequate for assessing PA pressure.  Left Atrium: Left atrial size was mildly dilated.  Right Atrium: Right atrial size was normal in size.  Pericardium: There is no evidence of pericardial effusion.  Mitral Valve: The mitral valve is normal in structure. Moderate mitral annular calcification. No evidence of mitral valve regurgitation. No evidence of mitral valve stenosis.  Tricuspid Valve: The tricuspid valve is normal in structure. Tricuspid valve regurgitation is not demonstrated. No evidence of tricuspid stenosis.  Aortic Valve: The aortic valve is tricuspid. There is mild calcification of the aortic valve. Aortic valve regurgitation is not visualized. Aortic valve sclerosis/calcification is present, without any evidence of aortic stenosis. Aortic valve mean gradient measures 7.0 mmHg. Aortic valve peak gradient measures 13.4 mmHg. Aortic valve area, by VTI measures 3.10 cm.  Pulmonic Valve: The pulmonic valve was normal in structure. Pulmonic valve regurgitation is not visualized. No evidence of pulmonic stenosis.  Aorta: The aortic root and ascending aorta are structurally normal, with no evidence of dilitation.  Venous: The inferior vena cava was not well  visualized.  IAS/Shunts: No atrial level shunt detected by color flow Doppler.   LEFT VENTRICLE PLAX 2D LVIDd:         3.50 cm   Diastology LVIDs:         2.20 cm   LV e' medial:    6.20 cm/s LV PW:         1.50 cm   LV E/e' medial:  14.0 LV IVS:        1.60 cm   LV e' lateral:   6.96 cm/s LVOT diam:     2.00 cm   LV E/e' lateral: 12.4 LV SV:         105 LV SV Index:   52 LVOT Area:     3.14 cm   RIGHT VENTRICLE RV Basal diam:  2.60 cm RV S prime:     20.60 cm/s TAPSE (M-mode): 2.4 cm  LEFT ATRIUM           Index  RIGHT ATRIUM           Index LA diam:      3.50 cm 1.72 cm/m   RA Area:     11.10 cm LA Vol (A2C): 41.1 ml 20.21 ml/m  RA Volume:   24.40 ml  12.00 ml/m LA Vol (A4C): 70.4 ml 34.63 ml/m AORTIC VALVE AV Area (Vmax):    2.76 cm AV Area (Vmean):   2.72 cm AV Area (VTI):     3.10 cm AV Vmax:           183.00 cm/s AV Vmean:          120.000 cm/s AV VTI:            0.338 m AV Peak Grad:      13.4 mmHg AV Mean Grad:      7.0 mmHg LVOT Vmax:         161.00 cm/s LVOT Vmean:        104.000 cm/s LVOT VTI:          0.334 m LVOT/AV VTI ratio: 0.99  AORTA Ao Root diam: 3.50 cm Ao Asc diam:  3.10 cm  MITRAL VALVE MV Area (PHT): 1.88 cm     SHUNTS MV Decel Time: 404 msec     Systemic VTI:  0.33 m MV E velocity: 86.50 cm/s   Systemic Diam: 2.00 cm MV A velocity: 121.00 cm/s MV E/A ratio:  0.71  Riley Lam MD Electronically signed by Riley Lam MD Signature Date/Time: 10/11/2022/12:27:34 PM    Final    MONITORS  LONG TERM MONITOR (3-14 DAYS) 07/04/2022  Narrative A. Fib noted 3% of the time with longest run lasting about 1.5 hours and detected with symptomatic patient events. There were also 12 pauses with longest lasting 4.5 seconds. I am going to place a urgent referral to cardiology. Does he have a preference for location? He should be evaluated in the ER if he experiences chest pain, shortness of breath, weakness,  syncope.   Patch Wear Time:  14 days and 0 hours (2023-09-06T14:35:24-0400 to 2023-09-20T14:35:28-0400)  Patient had a min HR of 35 bpm, max HR of 190 bpm, and avg HR of 65 bpm. Predominant underlying rhythm was Sinus Rhythm. Bundle Branch Block/IVCD was present. Atrial Fibrillation occurred (3% burden), ranging from 61-190 bpm (avg of 124 bpm), the longest lasting 1 hour 28 mins with an avg rate of 134 bpm. 12 Pauses occurred, the longest lasting 4.5 secs (13 bpm). Atrial Fibrillation was detected within +/- 45 seconds of symptomatic patient event(s). Isolated SVEs were occasional (2.8%, 36295), SVE Couplets were rare (<1.0%, 3465), and SVE Triplets were rare (<1.0%, 404). Isolated VEs were occasional (1.4%, 18684), VE Couplets were rare (<1.0%, 462), and VE Triplets were rare (<1.0%, 21). Ventricular Bigeminy and Trigeminy were present.    CARDIAC MRI  MR CARDIAC MORPHOLOGY W WO CONTRAST 04/11/2023  Narrative CLINICAL DATA:  ?Cardiac amyloidosis  EXAM: CARDIAC MRI  TECHNIQUE: The patient was scanned on a 1.5 Tesla GE magnet. A dedicated cardiac coil was used. Functional imaging was done using Fiesta sequences. 2,3, and 4 chamber views were done to assess for RWMA's. Modified Simpson's rule using a short axis stack was used to calculate an ejection fraction on a dedicated work [employer]. The patient received 10 cc of Gadavist. After 10 minutes inversion recovery sequences were used to assess for infiltration and scar tissue.  FINDINGS: Limited images of the lung fields showed no gross abnormalities.  Normal  left ventricular size with mild concentric LV hypertrophy. Normal wall motion, EF 67%. Normal right ventricular size and systolic function, EF 55%. Moderate left atrial enlargement, normal right atrium. Trileaflet aortic valve with no significant stenosis and trivial regurgitation (regurgitant fraction 8%). No significant mitral regurgitation.  On delayed  enhancement imaging, there was no definite late gadolinium enhancement (LGE).  MEASUREMENTS: MEASUREMENTS LVEDV 103 mL  LVEDVi 50 mL/m2 LVSV 69 mL LVEF 67%  RVEDV 114 mL  RVEDVi 55 mL/m2  RVSV 63 mL  RVEF 55%  T1 979, ECV 25%  Aortic forward volume 70 mL  Aortic regurgitant fraction 8%  IMPRESSION: 1.  Normal LV size with mild concentric LV hypertrophy, EF 67%.  2.  Normal RV size and systolic function, EF 55%.  3. Normal extracellular volume percentage (25%), this is not consistent with cardiac amyloidosis.  4. No definite myocardial LGE, so no evidence for prior MI, infiltrative disease, or myocarditis.  This study is not suggestive of cardiac amyloidosis.  Dalton Mclean   Electronically Signed By: Marca Ancona M.D. On: 04/11/2023 15:36   PYP SCAN  MYOCARDIAL AMYLOID PLANAR AND SPECT 03/16/2023  Interpretation Summary   Myocardial uptake was positive for radiotracer uptake. The visual grade of myocardial uptake relative to the ribs was Grade 1 (Myocardial uptake less than rib uptake).   Findings are equivocal (Grade 1) of cardiac ATTR amyloidosis.             Assessment & Plan   Paroxsymal Atrial Fibrillation - Risk factors include HTN, Age, and PAD - CHADSVASC=3. - Pending EP eval today for either AAD  load for AF suppression then AF ablation vs PPM for symptomatic  8 second AF pause noted by Dr. Bjorn Pippin 08/09/23 (ED telemetry unable to review) - K 3.90 - TSH WNL - normal biventricular size, mild LA dilation, no ATTR - will not discharge on metoprolol - Qtc 402; I will not add hydrochlorothiazide back incase Tikoysn is planned; I may add acetazolamide tomorrow depending on his plan - he does not smoke or drink alcohol, he exercises 2-3 times per week) - eliquis restart and cardiac diet once plan is in place  Moderate risk OSA - outpatient sleep study recommended - may add acetazolamide in place of hydrochlorothiazide depending on  plan  HTN - restart norvasc if no orthostasis  PAD - as per VVS, continue statin    For questions or updates, please contact [facility] Please consult [email or web address] for contact info under Cardiology/STEMI.      Riley Lam, MD FASE [facility]  [address], [address] [place], [place] [address] [phone]  7:46 AM

## 2023-08-10 NOTE — Progress Notes (Signed)
PROGRESS NOTE  Tommy [name] ZOX:[id number] DOB: Jun 23, 1951   PCP: [name], [name], MD  Patient is from: Home.  Independently ambulates at baseline.  DOA: 08/09/2023 LOS: 1  Chief complaints Chief Complaint  Patient presents with   Fall     Brief Narrative / Interim history: 72 year old M with PMH of paroxysmal A-fib and DVT on Eliquis, PAD s/p SFA stenting in 2009, BPH and HTN presented to [facility] with dizziness and fall.  Reportedly had dizziness for 5 days prior to presentation.  Mostly in the morning.  He fell and hit his head against his truck but did not LOC.  In ED, HR ranges from 54-104.  He had unresponsive episode when he had 8-second pause on telemetry.  Had several EKGs in ED mostly sinus rhythm with normal PR interval.  1 EKG with A-fib at the rate of 105.  CT head without acute finding.  After discussion with cardiology, it was recommended to transfer patient to [facility] for EP evaluation.  Subjective: Seen and examined earlier this morning.  No major events overnight of this morning.  No complaint this morning.  Remains in sinus rhythm with mild bradycardia.  Denies dizziness.  Denies chest pain, GI or UTI symptoms.  Objective: Vitals:   08/09/23 2334 08/10/23 0400 08/10/23 0800 08/10/23 1000  BP: (!) 159/82 (!) 154/77 (!) 180/78 (!) 165/78  Pulse:  (!) 56 (!) 58 60  Resp: 18 20 18 18   Temp: 98.6 F (37 C) 98.3 F (36.8 C) 98.3 F (36.8 C) 98.3 F (36.8 C)  TempSrc: Oral Axillary Oral Oral  SpO2: 93% 94% 95% 96%  Weight:  85.9 kg    Height:        Examination:  GENERAL: No apparent distress.  Nontoxic. HEENT: MMM.  Vision and hearing grossly intact.  NECK: Supple.  No apparent JVD.  RESP:  No IWOB.  Fair aeration bilaterally. CVS:  RRR. Heart sounds normal.  ABD/GI/GU: BS+. Abd soft, NTND.  MSK/EXT:  Moves extremities. No apparent deformity. No edema.  SKIN: no apparent skin lesion or wound NEURO: Awake, alert and oriented  appropriately.  No apparent focal neuro deficit. PSYCH: Calm. Normal affect.   Procedures:  None  Microbiology summarized: None  Assessment and plan: Principal Problem:   Syncope Active Problems:   PAD (peripheral artery disease) (HCC)   Essential hypertension   Atrial fibrillation (HCC)   Dizziness, nonspecific   Tachy-brady syndrome (HCC)   Paroxysmal A-fib with postconversion pauses-known history of this.  Currently in sinus rhythm. Tachybradycardia syndrome-likely due to the above. -Per cardiology and electrophysiology -Metoprolol on hold. -Optimize electrolytes  Syncope: Likely due to the above. -Defer to cardiology and electrophysiology   Essential hypertension: BP slightly elevated. -Continue home amlodipine and HCTZ -Add low-dose losartan   PAD S/p left SFA stenting in 2009 -Resume home aspirin and Crestor.  BPH -Continue Flomax  Body mass index is 26.41 kg/m.          DVT prophylaxis:  SCDs Start: 08/09/23 1847  Code Status: Full code Family Communication: None at bedside Level of care: Progressive Status is: Inpatient Remains inpatient appropriate because: Syncope and tachybradycardia syndrome   Final disposition: Likely home once medically cleared Consultants:  Cardiology Electrophysiology  55 minutes with more than 50% spent in reviewing records, counseling patient/family and coordinating care.   Sch Meds:  Scheduled Meds:  amLODipine  10 mg Oral Daily   hydrochlorothiazide  25 mg Oral Daily   Continuous Infusions:  PRN Meds:.acetaminophen **OR** acetaminophen, ondansetron **OR** ondansetron (ZOFRAN) IV, polyethylene glycol  Antimicrobials: Anti-infectives (From admission, onward)    None        I have personally reviewed the following labs and images: CBC: Recent Labs  Lab 08/09/23 0926 08/10/23 0340  WBC 3.0* 3.6*  NEUTROABS 1.5*  --   HGB 13.6 12.8*  HCT 41.6 37.5*  MCV 87.9 84.8  PLT 157 156   BMP  &GFR Recent Labs  Lab 08/09/23 0930 08/09/23 1937 08/10/23 0340  NA 139  --  139  K 3.7  --  3.9  CL 103  --  109  CO2 27  --  24  GLUCOSE 108*  --  86  BUN 21  --  17  CREATININE 1.03  --  1.07  CALCIUM 10.0  --  9.7  MG  --  2.1  --    Estimated Creatinine Clearance: 64.5 mL/min (by C-G formula based on SCr of 1.07 mg/dL). Liver & Pancreas: No results for input(s): "AST", "ALT", "ALKPHOS", "BILITOT", "PROT", "ALBUMIN" in the last 168 hours. No results for input(s): "LIPASE", "AMYLASE" in the last 168 hours. No results for input(s): "AMMONIA" in the last 168 hours. Diabetic: No results for input(s): "HGBA1C" in the last 72 hours. No results for input(s): "GLUCAP" in the last 168 hours. Cardiac Enzymes: No results for input(s): "CKTOTAL", "CKMB", "CKMBINDEX", "TROPONINI" in the last 168 hours. No results for input(s): "PROBNP" in the last 8760 hours. Coagulation Profile: No results for input(s): "INR", "PROTIME" in the last 168 hours. Thyroid Function Tests: Recent Labs    08/09/23 1937  TSH 1.341   Lipid Profile: No results for input(s): "CHOL", "HDL", "LDLCALC", "TRIG", "CHOLHDL", "LDLDIRECT" in the last 72 hours. Anemia Panel: No results for input(s): "VITAMINB12", "FOLATE", "FERRITIN", "TIBC", "IRON", "RETICCTPCT" in the last 72 hours. Urine analysis:    Component Value Date/Time   APPEARANCEUR Clear 06/02/2021 1506   GLUCOSEU Negative 06/02/2021 1506   BILIRUBINUR Negative 06/02/2021 1506   PROTEINUR Negative 06/02/2021 1506   NITRITE Negative 06/02/2021 1506   LEUKOCYTESUR Negative 06/02/2021 1506   Sepsis Labs: Invalid input(s): "PROCALCITONIN", "LACTICIDVEN"  Microbiology: No results found for this or any previous visit (from the past 240 hour(s)).  Radiology Studies: DG CHEST PORT 1 VIEW  Result Date: 08/09/2023 CLINICAL DATA:  Syncope EXAM: PORTABLE CHEST 1 VIEW COMPARISON:  02/26/2015 FINDINGS: Shallow inspiration. Mild linear atelectasis or  fibrosis in the lung bases. No pleural effusions or pneumothorax. Mediastinal contours appear intact. Heart size is normal for technique. Degenerative changes in the spine and shoulders. IMPRESSION: Shallow inspiration with linear atelectasis or fibrosis in the lung bases. Electronically Signed   By: Burman Nieves M.D.   On: 08/09/2023 22:59   CT Head Wo Contrast  Result Date: 08/09/2023 CLINICAL DATA:  Provided history: Head trauma, minor. Additional history: Fall (with head trauma). Dizziness. EXAM: CT HEAD WITHOUT CONTRAST TECHNIQUE: Contiguous axial images were obtained from the base of the skull through the vertex without intravenous contrast. RADIATION DOSE REDUCTION: This exam was performed according to the departmental dose-optimization program which includes automated exposure control, adjustment of the mA and/or kV according to patient size and/or use of iterative reconstruction technique. COMPARISON:  Head CT 08/04/2016. FINDINGS: Brain: Mild generalized parenchymal volume loss. A small chronic lacunar infarct is questioned within the right basal ganglia (series 2, image 18). There is no acute intracranial hemorrhage. No demarcated cortical infarct. No extra-axial fluid collection. No evidence of an intracranial mass. No midline  shift. Vascular: No hyperdense vessel.  Atherosclerotic calcifications. Skull: No calvarial fracture or aggressive osseous lesion. Sinuses/Orbits: No mass or acute finding within the imaged orbits. 14 mm mucous retention cyst, and mild background mucosal thickening, within the right maxillary sinus. Minimal mucosal thickening within the left maxillary sinus and within bilateral ethmoid air cells. IMPRESSION: 1.  No evidence of an acute intracranial abnormality. 2. Possible small chronic lacunar infarct within the right basal. 3. Mild generalized parenchymal volume loss. 4. Paranasal sinus disease as described. Electronically Signed   By: Jackey Loge D.O.   On: 08/09/2023  16:37      Ohn Bostic T. Gabor Lusk [facility]  If 7PM-7AM, please contact night-coverage [email or web address] 08/10/2023, 12:02 PM

## 2023-08-10 NOTE — Plan of Care (Signed)

## 2023-08-10 NOTE — Plan of Care (Signed)

## 2023-08-11 ENCOUNTER — Other Ambulatory Visit (HOSPITAL_COMMUNITY): Payer: Self-pay

## 2023-08-11 DIAGNOSIS — I495 Sick sinus syndrome: Secondary | ICD-10-CM | POA: Diagnosis not present

## 2023-08-11 DIAGNOSIS — I739 Peripheral vascular disease, unspecified: Secondary | ICD-10-CM | POA: Diagnosis not present

## 2023-08-11 DIAGNOSIS — I480 Paroxysmal atrial fibrillation: Secondary | ICD-10-CM | POA: Diagnosis not present

## 2023-08-11 DIAGNOSIS — I10 Essential (primary) hypertension: Secondary | ICD-10-CM | POA: Diagnosis not present

## 2023-08-11 DIAGNOSIS — R55 Syncope and collapse: Secondary | ICD-10-CM | POA: Diagnosis not present

## 2023-08-11 DIAGNOSIS — R42 Dizziness and giddiness: Secondary | ICD-10-CM | POA: Diagnosis not present

## 2023-08-11 LAB — BASIC METABOLIC PANEL
Anion gap: 7 (ref 5–15)
BUN: 16 mg/dL (ref 8–23)
CO2: 24 mmol/L (ref 22–32)
Calcium: 9.8 mg/dL (ref 8.9–10.3)
Chloride: 108 mmol/L (ref 98–111)
Creatinine, Ser: 1.07 mg/dL (ref 0.61–1.24)
GFR, Estimated: >60 mL/min (ref >60)
Glucose, Bld: 115 mg/dL — ABNORMAL HIGH (ref 70–99)
Potassium: 4 mmol/L (ref 3.5–5.1)
Sodium: 139 mmol/L (ref 135–145)

## 2023-08-11 LAB — MAGNESIUM: Magnesium: 2.1 mg/dL (ref 1.7–2.4)

## 2023-08-11 LAB — CBC
HCT: 39.3 % (ref 39.0–52.0)
Hemoglobin: 13.3 g/dL (ref 13.0–17.0)
MCH: 28.4 pg (ref 26.0–34.0)
MCHC: 33.8 g/dL (ref 30.0–36.0)
MCV: 84 fL (ref 80.0–100.0)
Platelets: 151 10*3/uL (ref 150–400)
RBC: 4.68 MIL/uL (ref 4.22–5.81)
RDW: 12.4 % (ref 11.5–15.5)
WBC: 3.8 10*3/uL — ABNORMAL LOW (ref 4.0–10.5)
nRBC: 0 % (ref 0.0–0.2)

## 2023-08-11 MED ORDER — LOSARTAN POTASSIUM 50 MG PO TABS
50.0000 mg | ORAL_TABLET | Freq: Every day | ORAL | 1 refills | Status: DC
  Filled 2023-08-11: qty 90, 90d supply, fill #0

## 2023-08-11 MED ORDER — LOSARTAN POTASSIUM 50 MG PO TABS
50.0000 mg | ORAL_TABLET | Freq: Every day | ORAL | Status: DC
  Administered 2023-08-11: 50 mg via ORAL
  Filled 2023-08-11: qty 1

## 2023-08-11 MED ORDER — AMIODARONE HCL 200 MG PO TABS
ORAL_TABLET | ORAL | 0 refills | Status: DC
  Filled 2023-08-11: qty 143, 90d supply, fill #0

## 2023-08-11 MED ORDER — CHLORTHALIDONE 25 MG PO TABS
25.0000 mg | ORAL_TABLET | Freq: Every day | ORAL | 1 refills | Status: DC
  Filled 2023-08-11: qty 60, 60d supply, fill #0

## 2023-08-11 MED ORDER — CHLORTHALIDONE 25 MG PO TABS
25.0000 mg | ORAL_TABLET | Freq: Every day | ORAL | Status: DC
  Administered 2023-08-11: 25 mg via ORAL
  Filled 2023-08-11 (×2): qty 1

## 2023-08-11 NOTE — Plan of Care (Signed)

## 2023-08-11 NOTE — Evaluation (Signed)
Physical Therapy Evaluation Patient Details Name: Tommy [name] MRN: [id number] DOB: 10/19/1950 Today's Date: 08/11/2023  History of Present Illness  72 y/o male presents [facility] 08/09/23 from [facility] after he fell hitting his head against his truck w/ complaints of dizziness that started 5 days prior. Admitted with syncope. PMHx: paroxysmal a-fib, HTN, PAD s/p SFA stenting 2009, DVT.   Clinical Impression  Pt in standing in front of recliner with chair alarm going off upon arrival. Pt was agreeable to PT eval. Prior to admit, pt was independent with no AD and independent with ADLs. Pt reports having dizzy spells with no precipitating event and no changes in position. Pt was able to stand and walk with supervision and RW for ~120 ft. Trialed no RW, however, pt prefers to have UE support at this time. Pt recently had HHPT and reports having equipment and remembers how to perform exercises independently at home. Pt presents to therapy session close to baseline with decreased balance and activity tolerance. Pt would benefit from acute skilled PT to address functional impairments. Recommending no PT follow up as pt is independent with following previous HEP. Acute PT to follow.          If plan is discharge home, recommend the following: A little help with walking and/or transfers   Can travel by private vehicle    Yes    Equipment Recommendations None recommended by PT (pt owns equipment)     Functional Status Assessment Patient has had a recent decline in their functional status and demonstrates the ability to make significant improvements in function in a reasonable and predictable amount of time.     Precautions / Restrictions Precautions Precautions: Fall Restrictions Weight Bearing Restrictions: No      Mobility  Bed Mobility  General bed mobility comments: in recliner upon arrival, returned to recliner    Transfers Overall transfer level: Needs assistance Equipment used:  Rolling walker (2 wheels) Transfers: Sit to/from Stand Sit to Stand: Supervision    General transfer comment: supervision for safety, cues for hand placement with RW    Ambulation/Gait Ambulation/Gait assistance: Supervision Gait Distance (Feet): 120 Feet Assistive device: Rolling walker (2 wheels), None Gait Pattern/deviations: Step-through pattern   Gait velocity interpretation: >2.62 ft/sec, indicative of community ambulatory   General Gait Details: hard heel strike L>R, steady and quick gait. Pt able to walk w/o AD, however, prefers to have UE support      Balance Overall balance assessment: Mild deficits observed, not formally tested, Needs assistance Sitting-balance support: No upper extremity supported, Feet supported Sitting balance-Leahy Scale: Good     Standing balance support: Bilateral upper extremity supported, During functional activity, Reliant on assistive device for balance Standing balance-Leahy Scale: Poor Standing balance comment: able to stand w/ no AD, reliant on RW for mobility          Pertinent Vitals/Pain Pain Assessment Pain Assessment: No/denies pain    Home Living Family/patient expects to be discharged to:: Private residence Living Arrangements: Spouse/significant other Available Help at Discharge: Family;Friend(s);Available 24 hours/day (family can offer physical support) Type of Home: House Home Access: Level entry       Home Layout: One level Home Equipment: Agricultural consultant (2 wheels);Cane - quad;Wheelchair - manual;BSC/3in1;Tub bench      Prior Function Prior Level of Function : Independent/Modified Independent             Mobility Comments: independent w/ no AD ADLs Comments: independent with ADLs, cooks, drives  Extremity/Trunk Assessment   Upper Extremity Assessment Upper Extremity Assessment: Overall WFL for tasks assessed    Lower Extremity Assessment Lower Extremity Assessment: Overall WFL for tasks  assessed    Cervical / Trunk Assessment Cervical / Trunk Assessment: Kyphotic  Communication   Communication Communication: No apparent difficulties Cueing Techniques: Verbal cues  Cognition Arousal: Alert Behavior During Therapy: WFL for tasks assessed/performed Overall Cognitive Status: Within Functional Limits for tasks assessed      General Comments: A&Ox4, able to follow multi step commands consistently        General Comments General comments (skin integrity, edema, etc.): VSS on RA, no symptoms of dizziness or lightheadedness     PT Assessment Patient needs continued PT services  PT Problem List Decreased activity tolerance;Decreased balance;Decreased mobility       PT Treatment Interventions DME instruction;Gait training;Functional mobility training;Therapeutic activities;Therapeutic exercise;Balance training;Patient/family education;Neuromuscular re-education    PT Goals (Current goals can be found in the Care Plan section)  Acute Rehab PT Goals Patient Stated Goal: to go home PT Goal Formulation: With patient Time For Goal Achievement: 08/25/23 Potential to Achieve Goals: Good    Frequency Min 1X/week        AM-PAC PT "6 Clicks" Mobility  Outcome Measure Help needed turning from your back to your side while in a flat bed without using bedrails?: None Help needed moving from lying on your back to sitting on the side of a flat bed without using bedrails?: None Help needed moving to and from a bed to a chair (including a wheelchair)?: A Little Help needed standing up from a chair using your arms (e.g., wheelchair or bedside chair)?: A Little Help needed to walk in hospital room?: A Little Help needed climbing 3-5 steps with a railing? : A Little 6 Click Score: 20    End of Session Equipment Utilized During Treatment: Gait belt Activity Tolerance: Patient tolerated treatment well Patient left: in chair;with call bell/phone within reach;with chair alarm  set Nurse Communication: Mobility status PT Visit Diagnosis: Unsteadiness on feet (R26.81);Repeated falls (R29.6)    Time: 1610-9604 PT Time Calculation (min) (ACUTE ONLY): 17 min   Charges:   PT Evaluation $PT Eval Low Complexity: 1 Low   PT General Charges $$ ACUTE PT VISIT: 1 Visit         Hilton Cork, PT, DPT Secure Chat Preferred  Rehab Office [phone]   Arturo Morton Brion Aliment 08/11/2023, 10:49 AM

## 2023-08-11 NOTE — Discharge Summary (Signed)
Physician Discharge Summary  EINER MEALS XBJ:[id number] DOB: [birth year] DOA: 08/09/2023  PCP: [name], [name], MD  Admit date: 08/09/2023 Discharge date: 08/11/2023 Admitted From: Home Disposition: Home Recommendations for Outpatient Follow-up:  Outpatient follow-up with PCP and cardiology as below Check BP, CMP and CBC at follow-up Please follow up on the following pending results: None  Home Health: Not indicated Equipment/Devices: Not indicated  Discharge Condition: Stable CODE STATUS: Full code  Follow-up Information     [name], [name], MD. Go on 08/23/2023.   Specialties: Family Medicine, Cardiology Why: @10 :30am Contact information: [address] [place] [address] [phone]         Jodelle Gross, NP Follow up.   Specialties: Cardiology, Radiology, Cardiology Why: Hospital follow-up with Cardiology scheduled for 08/18/2023 at 10:05am. Please arrive 15 minutes early for check-in. If this date/ time does not work for you, please call our office to reschedule. Contact information: [address] [place] [address] [phone]                 Hospital course 72 year old M with PMH of paroxysmal A-fib and DVT on Eliquis, PAD s/p SFA stenting in 2009, BPH and HTN presented to [facility] with dizziness and fall.  Reportedly had dizziness for 5 days prior to presentation.  Mostly in the morning.  He fell and hit his head against his truck but did not LOC.   In ED, HR ranges from 54-104.  He had unresponsive episode when he had 8-second pause on telemetry.  Had several EKGs in ED mostly sinus rhythm with normal PR interval.  1 EKG with A-fib at the rate of 105.  CT head without acute finding.  After discussion with cardiology, it was recommended to transfer patient to [facility] for EP evaluation.  Patient was evaluated by cardiology and electrophysiology.  He was started on p.o. amiodarone.  Remained in sinus rhythm  without further A-fib or pauses.  Cleared for discharge on p.o. amiodarone taper as below.  Toprol-XL and HCTZ discontinued per recommendation by cardiology.  Started on losartan and chlorthalidone for better BP control.  Outpatient follow-up with PCP and cardiology as above.  Electrophysiology considering ablation outpatient  See individual problem list below for more.   Problems addressed during this hospitalization Principal Problem:   Syncope Active Problems:   PAD (peripheral artery disease) (HCC)   Essential hypertension   Atrial fibrillation (HCC)   Dizziness, nonspecific   Tachy-brady syndrome (HCC)   Paroxysmal A-fib with postconversion pauses-known history of this.  In sinus rhythm since transferred to [facility]. Tachybradycardia syndrome-likely due to the above.  TSH within normal. -Discharge on p.o. amiodarone 400 mg twice daily for 2 weeks followed by 200 mg for 2 weeks and 200 mg daily afterward -On Eliquis for anticoagulation. -Ablation outpatient -Toprol-XL discontinued per recommendation by cardiology   Syncope: Likely due to the above. -Management as above.   Essential hypertension: BP slightly elevated. -Continue home amlodipine -HCTZ changed to chlorthalidone by cardiology -Started losartan 25 mg daily and increase to 50 mg daily on discharge -Reassess and adjust as appropriate at follow-up   PAD S/p left SFA stenting in 2009 -Continue home aspirin and Crestor.   BPH -Continue Flomax  Leukopenia: Mild.  Nonspecific. -Recheck CBC at follow-up            Time spent 35 minutes  Vital signs Vitals:   08/11/23 0438 08/11/23 0729 08/11/23 0733 08/11/23 0840  BP: Marland Kitchen)  176/93  (!) 158/93 139/86  Pulse: (!) 59  65 62  Temp: 98 F (36.7 C) (!) 97.3 F (36.3 C) (!) 97.3 F (36.3 C) 98 F (36.7 C)  Resp: 18  18 18   Height:      Weight: 86.9 kg     SpO2: 95%  95% 95%  TempSrc: Oral Oral Oral Oral  BMI (Calculated): 26.73        Discharge  exam  GENERAL: No apparent distress.  Nontoxic. HEENT: MMM.  Vision and hearing grossly intact.  NECK: Supple.  No apparent JVD.  RESP:  No IWOB.  Fair aeration bilaterally. CVS:  RRR. Heart sounds normal.  ABD/GI/GU: BS+. Abd soft, NTND.  MSK/EXT:  Moves extremities. No apparent deformity. No edema.  SKIN: no apparent skin lesion or wound NEURO: Awake and alert. Oriented appropriately.  No apparent focal neuro deficit. PSYCH: Calm. Normal affect.   Discharge Instructions Discharge Instructions     Diet - low sodium heart healthy   Complete by: As directed    Discharge instructions   Complete by: As directed    It has been a pleasure taking care of you!  You were hospitalized due to dizziness, passing out and irregular heartbeat (atrial fibrillation) for which you have been treated with medication.  Your symptoms improved.  We are discharging you on new medications for your blood pressure and atrial fibrillation.  Please review your new medication list and the directions on your medications before you take them.  Follow-up with your primary care doctor in 1 to 2 weeks or sooner if needed.  Follow-up with cardiologist/electrophysiologist per their recommendation.   Take care,   Increase activity slowly   Complete by: As directed       Allergies as of 08/11/2023       Reactions   Penicillins Swelling        Medication List     STOP taking these medications    ADVIL PO   hydrochlorothiazide 25 MG tablet Commonly known as: HYDRODIURIL   metoprolol succinate 25 MG 24 hr tablet Commonly known as: TOPROL-XL       TAKE these medications    amiodarone 200 MG tablet Commonly known as: PACERONE Take 2 tablets (400 mg total) by mouth 2 (two) times daily for 13 days, THEN 1 tablet (200 mg total) 2 (two) times daily for 14 days, THEN 1 tablet (200 mg total) daily. Start taking on: August 11, 2023 Notes to patient: Take twice daily and taper down as directed     amLODipine 10 MG tablet Commonly known as: NORVASC Take 1 tablet (10 mg total) by mouth daily.   aspirin EC 81 MG tablet Take 1 tablet (81 mg total) by mouth daily. Swallow whole.   chlorthalidone 25 MG tablet Commonly known as: HYGROTON Take 1 tablet (25 mg total) by mouth daily. Start taking on: August 12, 2023   Eliquis 5 MG Tabs tablet Generic drug: apixaban TAKE 1 TABLET TWICE DAILY   fenofibrate 145 MG tablet Commonly known as: TRICOR Take 1 tablet (145 mg total) by mouth daily.   losartan 50 MG tablet Commonly known as: COZAAR Take 1 tablet (50 mg total) by mouth daily. Start taking on: August 12, 2023   rosuvastatin 10 MG tablet Commonly known as: CRESTOR Take 1 tablet (10 mg total) by mouth every evening.   tamsulosin 0.4 MG Caps capsule Commonly known as: FLOMAX Take 1 capsule (0.4 mg total) by mouth daily after supper.  Consultations: Cardiology Electrophysiology  Procedures/Studies:   DG CHEST PORT 1 VIEW  Result Date: 08/09/2023 CLINICAL DATA:  Syncope EXAM: PORTABLE CHEST 1 VIEW COMPARISON:  02/26/2015 FINDINGS: Shallow inspiration. Mild linear atelectasis or fibrosis in the lung bases. No pleural effusions or pneumothorax. Mediastinal contours appear intact. Heart size is normal for technique. Degenerative changes in the spine and shoulders. IMPRESSION: Shallow inspiration with linear atelectasis or fibrosis in the lung bases. Electronically Signed   By: Burman Nieves M.D.   On: 08/09/2023 22:59   CT Head Wo Contrast  Result Date: 08/09/2023 CLINICAL DATA:  Provided history: Head trauma, minor. Additional history: Fall (with head trauma). Dizziness. EXAM: CT HEAD WITHOUT CONTRAST TECHNIQUE: Contiguous axial images were obtained from the base of the skull through the vertex without intravenous contrast. RADIATION DOSE REDUCTION: This exam was performed according to the departmental dose-optimization program which includes automated  exposure control, adjustment of the mA and/or kV according to patient size and/or use of iterative reconstruction technique. COMPARISON:  Head CT 08/04/2016. FINDINGS: Brain: Mild generalized parenchymal volume loss. A small chronic lacunar infarct is questioned within the right basal ganglia (series 2, image 18). There is no acute intracranial hemorrhage. No demarcated cortical infarct. No extra-axial fluid collection. No evidence of an intracranial mass. No midline shift. Vascular: No hyperdense vessel.  Atherosclerotic calcifications. Skull: No calvarial fracture or aggressive osseous lesion. Sinuses/Orbits: No mass or acute finding within the imaged orbits. 14 mm mucous retention cyst, and mild background mucosal thickening, within the right maxillary sinus. Minimal mucosal thickening within the left maxillary sinus and within bilateral ethmoid air cells. IMPRESSION: 1.  No evidence of an acute intracranial abnormality. 2. Possible small chronic lacunar infarct within the right basal. 3. Mild generalized parenchymal volume loss. 4. Paranasal sinus disease as described. Electronically Signed   By: Jackey Loge D.O.   On: 08/09/2023 16:37       The results of significant diagnostics from this hospitalization (including imaging, microbiology, ancillary and laboratory) are listed below for reference.     Microbiology: No results found for this or any previous visit (from the past 240 hour(s)).   Labs:  CBC: Recent Labs  Lab 08/09/23 0926 08/10/23 0340 08/11/23 0442  WBC 3.0* 3.6* 3.8*  NEUTROABS 1.5*  --   --   HGB 13.6 12.8* 13.3  HCT 41.6 37.5* 39.3  MCV 87.9 84.8 84.0  PLT 157 156 151   BMP &GFR Recent Labs  Lab 08/09/23 0930 08/09/23 1937 08/10/23 0340 08/11/23 0442  NA 139  --  139 139  K 3.7  --  3.9 4.0  CL 103  --  109 108  CO2 27  --  24 24  GLUCOSE 108*  --  86 115*  BUN 21  --  17 16  CREATININE 1.03  --  1.07 1.07  CALCIUM 10.0  --  9.7 9.8  MG  --  2.1  --  2.1    Estimated Creatinine Clearance: 64.5 mL/min (by C-G formula based on SCr of 1.07 mg/dL). Liver & Pancreas: No results for input(s): "AST", "ALT", "ALKPHOS", "BILITOT", "PROT", "ALBUMIN" in the last 168 hours. No results for input(s): "LIPASE", "AMYLASE" in the last 168 hours. No results for input(s): "AMMONIA" in the last 168 hours. Diabetic: No results for input(s): "HGBA1C" in the last 72 hours. No results for input(s): "GLUCAP" in the last 168 hours. Cardiac Enzymes: No results for input(s): "CKTOTAL", "CKMB", "CKMBINDEX", "TROPONINI" in the last 168 hours. No results for  input(s): "PROBNP" in the last 8760 hours. Coagulation Profile: No results for input(s): "INR", "PROTIME" in the last 168 hours. Thyroid Function Tests: Recent Labs    08/09/23 1937  TSH 1.341   Lipid Profile: No results for input(s): "CHOL", "HDL", "LDLCALC", "TRIG", "CHOLHDL", "LDLDIRECT" in the last 72 hours. Anemia Panel: No results for input(s): "VITAMINB12", "FOLATE", "FERRITIN", "TIBC", "IRON", "RETICCTPCT" in the last 72 hours. Urine analysis:    Component Value Date/Time   APPEARANCEUR Clear 06/02/2021 1506   GLUCOSEU Negative 06/02/2021 1506   BILIRUBINUR Negative 06/02/2021 1506   PROTEINUR Negative 06/02/2021 1506   NITRITE Negative 06/02/2021 1506   LEUKOCYTESUR Negative 06/02/2021 1506   Sepsis Labs: Invalid input(s): "PROCALCITONIN", "LACTICIDVEN"   SIGNED:  Almon Hercules, MD  [facility] 08/11/2023, 2:57 PM

## 2023-08-11 NOTE — Progress Notes (Signed)
Progress Note  Patient Name: EDEM TIEGS Date of Encounter: 08/11/2023 Primary Cardiologist: Rollene Rotunda, MD   Subjective   No pauses overnight. BP still elevated despite returning to home meds.  Eas been experiencing elevated blood pressure, particularly during the early morning period. Denies any symptoms of atrial fibrillation such as palpitations or dizziness.  Vital Signs    Vitals:   08/11/23 0133 08/11/23 0438 08/11/23 0729 08/11/23 0733  BP:  (!) 176/93  (!) 158/93  Pulse:  (!) 59  65  Resp:  18  18  Temp: (!) 97 F (36.1 C) 98 F (36.7 C) (!) 97.3 F (36.3 C) (!) 97.3 F (36.3 C)  TempSrc: Oral Oral Oral Oral  SpO2:  95%  95%  Weight:  86.9 kg    Height:        Intake/Output Summary (Last 24 hours) at 08/11/2023 0742 Last data filed at 08/11/2023 0700 Gross per 24 hour  Intake 716 ml  Output 1400 ml  Net -684 ml   Filed Weights   08/09/23 0919 08/10/23 0400 08/11/23 0438  Weight: 86.2 kg 85.9 kg 86.9 kg    Physical Exam   GEN: No acute distress.   Neck: No JVD CARDIOVASCULAR: No heart murmurs, bounding pulse noted.  Regular rhythm Respiratory: Clear to auscultation bilaterally. GI: Soft, nontender, non-distended  EXTREMITIES: Missing digits, unchanged.  Labs   Telemetry: Sinus bradycardia to Sinus rhythm.  No AF no Pauses   Chemistry Recent Labs  Lab 08/09/23 0930 08/10/23 0340 08/11/23 0442  NA 139 139 139  K 3.7 3.9 4.0  CL 103 109 108  CO2 27 24 24   GLUCOSE 108* 86 115*  BUN 21 17 16   CREATININE 1.03 1.07 1.07  CALCIUM 10.0 9.7 9.8  GFRNONAA >60 >60 >60  ANIONGAP 9 6 7      Hematology Recent Labs  Lab 08/09/23 0926 08/10/23 0340 08/11/23 0442  WBC 3.0* 3.6* 3.8*  RBC 4.73 4.42 4.68  HGB 13.6 12.8* 13.3  HCT 41.6 37.5* 39.3  MCV 87.9 84.8 84.0  MCH 28.8 29.0 28.4  MCHC 32.7 34.1 33.8  RDW 12.6 12.5 12.4  PLT 157 156 151    Cardiac EnzymesNo results for input(s): "TROPONINI" in the last 168 hours. No results for  input(s): "TROPIPOC" in the last 168 hours.   BNPNo results for input(s): "BNP", "PROBNP" in the last 168 hours.   DDimer No results for input(s): "DDIMER" in the last 168 hours.   Cardiac Studies   Cardiac Studies & Procedures       ECHOCARDIOGRAM  ECHOCARDIOGRAM COMPLETE 10/11/2022  Narrative ECHOCARDIOGRAM REPORT    Patient Name:   RAHEEM KOLBE Darling Date of Exam: 10/11/2022 Medical Rec #:  [id number]   Height:       70.0 in Accession #:    [id number]  Weight:       188.0 lb Date of Birth:  [birth year]   BSA:          2.033 m Patient Age:    71 years    BP:           163/79 mmHg Patient Gender: M           HR:           65 bpm. Exam Location:  Outpatient  Procedure: 2D Echo, Cardiac Doppler, Color Doppler and Intracardiac Opacification Agent  Indications:    Atrial fibrillation  History:        Patient has no prior history  of Echocardiogram examinations. PAD; Risk Factors:Hypertension and Dyslipidemia.  Sonographer:    Ross Ludwig RDCS (AE) Referring Phys: [id number] CLINT R FENTON   Sonographer Comments: Technically difficult study due to poor echo windows. IMPRESSIONS   1. Left ventricular ejection fraction, by estimation, is 65 to 70%. The left ventricle has normal function. The left ventricle has no regional wall motion abnormalities. There is severe concentric left ventricular hypertrophy. Left ventricular diastolic parameters are consistent with Grade I diastolic dysfunction (impaired relaxation). 2. Right ventricular systolic function is hyperdynamic. The right ventricular size is normal. Tricuspid regurgitation signal is inadequate for assessing PA pressure. 3. Left atrial size was mildly dilated. 4. The mitral valve is normal in structure. No evidence of mitral valve regurgitation. No evidence of mitral stenosis. Moderate mitral annular calcification. 5. The aortic valve is tricuspid. There is mild calcification of the aortic valve. Aortic valve regurgitation is not  visualized. Aortic valve sclerosis/calcification is present, without any evidence of aortic stenosis.  Comparison(s): No prior Echocardiogram.  FINDINGS Left Ventricle: Left ventricular ejection fraction, by estimation, is 65 to 70%. The left ventricle has normal function. The left ventricle has no regional wall motion abnormalities. Definity contrast agent was given IV to delineate the left ventricular endocardial borders. The left ventricular internal cavity size was small. There is severe concentric left ventricular hypertrophy. Left ventricular diastolic parameters are consistent with Grade I diastolic dysfunction (impaired relaxation).  Right Ventricle: The right ventricular size is normal. No increase in right ventricular wall thickness. Right ventricular systolic function is hyperdynamic. Tricuspid regurgitation signal is inadequate for assessing PA pressure.  Left Atrium: Left atrial size was mildly dilated.  Right Atrium: Right atrial size was normal in size.  Pericardium: There is no evidence of pericardial effusion.  Mitral Valve: The mitral valve is normal in structure. Moderate mitral annular calcification. No evidence of mitral valve regurgitation. No evidence of mitral valve stenosis.  Tricuspid Valve: The tricuspid valve is normal in structure. Tricuspid valve regurgitation is not demonstrated. No evidence of tricuspid stenosis.  Aortic Valve: The aortic valve is tricuspid. There is mild calcification of the aortic valve. Aortic valve regurgitation is not visualized. Aortic valve sclerosis/calcification is present, without any evidence of aortic stenosis. Aortic valve mean gradient measures 7.0 mmHg. Aortic valve peak gradient measures 13.4 mmHg. Aortic valve area, by VTI measures 3.10 cm.  Pulmonic Valve: The pulmonic valve was normal in structure. Pulmonic valve regurgitation is not visualized. No evidence of pulmonic stenosis.  Aorta: The aortic root and ascending aorta  are structurally normal, with no evidence of dilitation.  Venous: The inferior vena cava was not well visualized.  IAS/Shunts: No atrial level shunt detected by color flow Doppler.   LEFT VENTRICLE PLAX 2D LVIDd:         3.50 cm   Diastology LVIDs:         2.20 cm   LV e' medial:    6.20 cm/s LV PW:         1.50 cm   LV E/e' medial:  14.0 LV IVS:        1.60 cm   LV e' lateral:   6.96 cm/s LVOT diam:     2.00 cm   LV E/e' lateral: 12.4 LV SV:         105 LV SV Index:   52 LVOT Area:     3.14 cm   RIGHT VENTRICLE RV Basal diam:  2.60 cm RV S prime:     20.60  cm/s TAPSE (M-mode): 2.4 cm  LEFT ATRIUM           Index        RIGHT ATRIUM           Index LA diam:      3.50 cm 1.72 cm/m   RA Area:     11.10 cm LA Vol (A2C): 41.1 ml 20.21 ml/m  RA Volume:   24.40 ml  12.00 ml/m LA Vol (A4C): 70.4 ml 34.63 ml/m AORTIC VALVE AV Area (Vmax):    2.76 cm AV Area (Vmean):   2.72 cm AV Area (VTI):     3.10 cm AV Vmax:           183.00 cm/s AV Vmean:          120.000 cm/s AV VTI:            0.338 m AV Peak Grad:      13.4 mmHg AV Mean Grad:      7.0 mmHg LVOT Vmax:         161.00 cm/s LVOT Vmean:        104.000 cm/s LVOT VTI:          0.334 m LVOT/AV VTI ratio: 0.99  AORTA Ao Root diam: 3.50 cm Ao Asc diam:  3.10 cm  MITRAL VALVE MV Area (PHT): 1.88 cm     SHUNTS MV Decel Time: 404 msec     Systemic VTI:  0.33 m MV E velocity: 86.50 cm/s   Systemic Diam: 2.00 cm MV A velocity: 121.00 cm/s MV E/A ratio:  0.71  Riley Lam MD Electronically signed by Riley Lam MD Signature Date/Time: 10/11/2022/12:27:34 PM    Final    MONITORS  LONG TERM MONITOR (3-14 DAYS) 07/04/2022  Narrative A. Fib noted 3% of the time with longest run lasting about 1.5 hours and detected with symptomatic patient events. There were also 12 pauses with longest lasting 4.5 seconds. I am going to place a urgent referral to cardiology. Does he have a preference for location?  He should be evaluated in the ER if he experiences chest pain, shortness of breath, weakness, syncope.   Patch Wear Time:  14 days and 0 hours (2023-09-06T14:35:24-0400 to 2023-09-20T14:35:28-0400)  Patient had a min HR of 35 bpm, max HR of 190 bpm, and avg HR of 65 bpm. Predominant underlying rhythm was Sinus Rhythm. Bundle Branch Block/IVCD was present. Atrial Fibrillation occurred (3% burden), ranging from 61-190 bpm (avg of 124 bpm), the longest lasting 1 hour 28 mins with an avg rate of 134 bpm. 12 Pauses occurred, the longest lasting 4.5 secs (13 bpm). Atrial Fibrillation was detected within +/- 45 seconds of symptomatic patient event(s). Isolated SVEs were occasional (2.8%, 36295), SVE Couplets were rare (<1.0%, 3465), and SVE Triplets were rare (<1.0%, 404). Isolated VEs were occasional (1.4%, 18684), VE Couplets were rare (<1.0%, 462), and VE Triplets were rare (<1.0%, 21). Ventricular Bigeminy and Trigeminy were present.    CARDIAC MRI  MR CARDIAC MORPHOLOGY W WO CONTRAST 04/11/2023  Narrative CLINICAL DATA:  ?Cardiac amyloidosis  EXAM: CARDIAC MRI  TECHNIQUE: The patient was scanned on a 1.5 Tesla GE magnet. A dedicated cardiac coil was used. Functional imaging was done using Fiesta sequences. 2,3, and 4 chamber views were done to assess for RWMA's. Modified Simpson's rule using a short axis stack was used to calculate an ejection fraction on a dedicated work [employer]. The patient received 10 cc of Gadavist. After 10 minutes  inversion recovery sequences were used to assess for infiltration and scar tissue.  FINDINGS: Limited images of the lung fields showed no gross abnormalities.  Normal left ventricular size with mild concentric LV hypertrophy. Normal wall motion, EF 67%. Normal right ventricular size and systolic function, EF 55%. Moderate left atrial enlargement, normal right atrium. Trileaflet aortic valve with no significant stenosis and trivial  regurgitation (regurgitant fraction 8%). No significant mitral regurgitation.  On delayed enhancement imaging, there was no definite late gadolinium enhancement (LGE).  MEASUREMENTS: MEASUREMENTS LVEDV 103 mL  LVEDVi 50 mL/m2 LVSV 69 mL LVEF 67%  RVEDV 114 mL  RVEDVi 55 mL/m2  RVSV 63 mL  RVEF 55%  T1 979, ECV 25%  Aortic forward volume 70 mL  Aortic regurgitant fraction 8%  IMPRESSION: 1.  Normal LV size with mild concentric LV hypertrophy, EF 67%.  2.  Normal RV size and systolic function, EF 55%.  3. Normal extracellular volume percentage (25%), this is not consistent with cardiac amyloidosis.  4. No definite myocardial LGE, so no evidence for prior MI, infiltrative disease, or myocarditis.  This study is not suggestive of cardiac amyloidosis.  Dalton Mclean   Electronically Signed By: Marca Ancona M.D. On: 04/11/2023 15:36   PYP SCAN  MYOCARDIAL AMYLOID PLANAR AND SPECT 03/16/2023  Interpretation Summary   Myocardial uptake was positive for radiotracer uptake. The visual grade of myocardial uptake relative to the ribs was Grade 1 (Myocardial uptake less than rib uptake).   Findings are equivocal (Grade 1) of cardiac ATTR amyloidosis.             Assessment & Plan   Paroxsymal Atrial Fibrillation - Risk factors include HTN, Age, and PAD - CHADSVASC=3. - planned for AAD (amiodarone) and future ablation (See EP note)) - normal biventricular size, mild LA dilation, no ATTR - will not discharge on metoprolol - he does not smoke or drink alcohol, he exercises 2-3 times per week  Moderate risk OSA - outpatient sleep study recommended  HTN - BP is still elevated with cessation of metoprolol - changing hydrochlorothiazide to chlorthalidone- needs outpatient BMP for K eval  PAD - as per VVS, continue statin   Will set up with general non-urgent follow up, from a cardiac standpoint, I suspect discharge today unless non-cardiac issues  arrise    For questions or updates, please contact [facility] Please consult [email or web address] for contact info under Cardiology/STEMI.      Riley Lam, MD FASE [facility] Cardiologist [facility]  [address] [place], [address] [place], [place] [address] [phone]  7:42 AM

## 2023-08-11 NOTE — TOC Transition Note (Signed)
Transition of Care [facility]) - CM/SW Discharge Note   Patient Details  Name: Tommy [name] MRN: [id number] Date of Birth: July 05, 1951  [facility] CM/SW Contact:  Leone Haven, RN Phone Number: 08/11/2023, 11:48 AM   Clinical Narrative:    For dc today, has no needs. TOC to fill meds.     Barriers to Discharge: Continued Medical Work up   Patient Goals and CMS Choice   Choice offered to / list presented to : NA  Discharge Placement                         Discharge Plan and Services Additional resources added to the After Visit Summary for   In-house Referral: NA Discharge Planning Services: CM Consult Post Acute Care Choice: NA          DME Arranged: N/A DME Agency: NA       HH Arranged: NA          Social Determinants of Health (SDOH) Interventions SDOH Screenings   Food Insecurity: No Food Insecurity (06/15/2023)  Housing: Low Risk  (06/15/2023)  Transportation Needs: No Transportation Needs (06/15/2023)  Utilities: Not At Risk (06/15/2023)  Alcohol Screen: Low Risk  (06/15/2023)  Depression (PHQ2-9): Low Risk  (08/09/2023)  Financial Resource Strain: Low Risk  (06/15/2023)  Physical Activity: Sufficiently Active (06/15/2023)  Social Connections: Socially Integrated (06/15/2023)  Stress: No Stress Concern Present (06/15/2023)  Tobacco Use: High Risk (08/09/2023)  Health Literacy: Adequate Health Literacy (06/15/2023)     Readmission Risk Interventions     No data to display

## 2023-08-12 ENCOUNTER — Encounter: Payer: Self-pay | Admitting: Family Medicine

## 2023-08-14 ENCOUNTER — Telehealth: Payer: Self-pay

## 2023-08-14 DIAGNOSIS — I480 Paroxysmal atrial fibrillation: Secondary | ICD-10-CM

## 2023-08-14 NOTE — Telephone Encounter (Signed)
Dr. Louanne Skye,  Do yo need to see him in the office first? Last seen 10/30 with Dois Davenport. Was sent out by EMS during visit. Dois Davenport is not here today and you are covering.

## 2023-08-14 NOTE — Transitions of Care (Post Inpatient/ED Visit) (Signed)
   08/14/2023  Name: Tommy [name] MRN: [id number] DOB: [birth year]  Today's TOC FU Call Status: Today's TOC FU Call Status:: Unsuccessful Call (1st Attempt) Unsuccessful Call (1st Attempt) Date: 08/14/23  Attempted to reach the patient regarding the most recent Inpatient/ED visit.  Follow Up Plan: Additional outreach attempts will be made to reach the patient to complete the Transitions of Care (Post Inpatient/ED visit) call.   Jodelle Gross RN, BSN, CCM RN Care Manager  Transitions of Care  [facility]  [phone]

## 2023-08-14 NOTE — Transitions of Care (Post Inpatient/ED Visit) (Signed)
08/14/2023  Name: Tommy [name] MRN: [id number] DOB: 03/30/1951  Today's TOC FU Call Status: Today's TOC FU Call Status:: Successful TOC FU Call Completed TOC FU Call Complete Date: 08/14/23 Patient's Name and Date of Birth confirmed.  Transition Care Management Follow-up Telephone Call Date of Discharge: 08/11/23 Discharge Facility: [facility]) Type of Discharge: Inpatient Admission Primary Inpatient Discharge Diagnosis:: Syncope How have you been since you were released from the hospital?: Better (Patient states he feels great!) Any questions or concerns?: Yes Patient Questions/Concerns:: I want to go back to work Patient Questions/Concerns Addressed: Other: (advised patient that he needs to have follow up on new medications and status prior to getting a work note)  Items Reviewed: Did you receive and understand the discharge instructions provided?: Yes Medications obtained,verified, and reconciled?: Yes (Medications Reviewed) Any new allergies since your discharge?: No Dietary orders reviewed?: Yes Type of Diet Ordered:: Low Sodium, heart healthy Do you have support at home?: Yes People in Home: spouse Name of Support/Comfort Primary Source: Jamesetta So  Medications Reviewed Today: Medications Reviewed Today     Reviewed by Jodelle Gross, RN (Case Manager) on 08/14/23 at 1051  Med List Status: <None>   Medication Order Taking? Sig Documenting Provider Last Dose Status Informant  amiodarone (PACERONE) 200 MG tablet [id number] Yes Take 2 tablets (400 mg total) by mouth 2 (two) times daily for 13 days, THEN 1 tablet (200 mg total) 2 (two) times daily for 14 days, THEN 1 tablet (200 mg total) daily. Almon Hercules, MD Taking Active   amLODipine (NORVASC) 10 MG tablet [id number] Yes Take 1 tablet (10 mg total) by mouth daily. [name], [name], MD Taking Active Self, Pharmacy Records  apixaban [facility]) 5 MG TABS tablet [id number] Yes TAKE 1 TABLET TWICE DAILY Rollene Rotunda, MD  Taking Active Self, Pharmacy Records  aspirin EC 81 MG tablet [id number] Yes Take 1 tablet (81 mg total) by mouth daily. Swallow whole. Fenton, Clint R, PA Taking Active Self, Pharmacy Records  chlorthalidone (HYGROTON) 25 MG tablet [id number] Yes Take 1 tablet (25 mg total) by mouth daily. Almon Hercules, MD Taking Active   fenofibrate (TRICOR) 145 MG tablet [id number] Yes Take 1 tablet (145 mg total) by mouth daily. [name], [name], MD Taking Active Self, Pharmacy Records  losartan (COZAAR) 50 MG tablet [id number] Yes Take 1 tablet (50 mg total) by mouth daily. Almon Hercules, MD Taking Active   rosuvastatin (CRESTOR) 10 MG tablet [id number] Yes Take 1 tablet (10 mg total) by mouth every evening. [name], [name], MD Taking Active Self, Pharmacy Records  tamsulosin [facility]) 0.4 MG CAPS capsule [id number] Yes Take 1 capsule (0.4 mg total) by mouth daily after supper. [name], [name], MD Taking Active Self, Pharmacy Records            Home Care and Equipment/Supplies: Were Home Health Services Ordered?: No Any new equipment or medical supplies ordered?: No  Functional Questionnaire: Do you need assistance with bathing/showering or dressing?: No Do you need assistance with meal preparation?: No Do you need assistance with eating?: No Do you have difficulty maintaining continence: No Do you need assistance with getting out of bed/getting out of a chair/moving?: No Do you have difficulty managing or taking your medications?: No  Follow up appointments reviewed: PCP Follow-up appointment confirmed?: Yes Date of PCP follow-up appointment?: 08/23/23 Follow-up Provider: Dr. Louanne Skye Specialist [facility] Follow-up appointment confirmed?: Yes Date of Specialist follow-up appointment?: 08/18/23 Follow-Up Specialty Provider:: Dr. Lyman Bishop Do you need  transportation to your follow-up appointment?: No Do you understand care options if your condition(s) worsen?: Yes-patient verbalized  understanding  SDOH Interventions Today    Flowsheet Row Most Recent Value  SDOH Interventions   Food Insecurity Interventions Intervention Not Indicated  Housing Interventions Intervention Not Indicated  Transportation Interventions Intervention Not Indicated  Utilities Interventions Intervention Not Indicated      Jodelle Gross RN, BSN, CCM RN Care Manager  Transitions of Care  [facility]  [phone]

## 2023-08-14 NOTE — Telephone Encounter (Signed)
He was sent to the hospital after seeing Dois Davenport and was in for an admission so yes he needs to be seen for hospital follow-up, in office

## 2023-08-14 NOTE — Telephone Encounter (Signed)
Pt was seen my Dr. Elberta Fortis and Canary Brim, NP in the hospital. He was started on Amio and has an in-office f/u with Brandi. They ask me to go ahead and give the pt a date for an ablation.  I called and scheduled to pt for an Afib Ablation with Dr. Elberta Fortis on 12/16 at 10:30 AM.  He will get labs done during his visit with Brandi on 12/4.Marland Kitchen CT is scheduled on 12/6...  I advised pt that we would go over all instructions with him at his o/v with Brandi.

## 2023-08-15 NOTE — Progress Notes (Signed)
Cardiology Office Note:  .   Date:  08/18/2023  ID:  Tommy [name], DOB [birth year], MRN [id number] PCP: [name], [name], MD  [facility] Providers Cardiologist:  Rollene Rotunda, MD }   History of Present Illness: .   Tommy [name] is a 72 y.o. male we are seeing for post hospitalization follow-up.  Patient has a history of PAF, DVT on Eliquis, PAD status post SFA stenting in 2009, hypertension, and BPH.  Admitted on August 09, 2023 and discharged on 08/11/2023 in the setting of dizziness resulting in a fall.  Tommy [name] and hit his head against his truck but did not lose consciousness.  He was found to be in atrial fibrillation with a heart rate of 105.  He was initially seen at [facility] but transferred to [facility] for EP evaluation.    He was started on p.o. amiodarone and on discharge remained in normal sinus rhythm without further A-fib.  On discharge he was started on losartan, chlorthalidone due to hypertension, metoprolol and HCTZ were discontinued.  He is now on Eliquis, he was to be loaded with 400 mg twice daily for 2 weeks and then decrease to 200 mg for 2 weeks and then 200 mg daily on amiodarone dosing.  He is to follow-up with EP to discuss ablation.  He comes today without any symptoms of shortness of breath, he is cardiac unaware, no swelling, tolerating Eliquis without bleeding or bruising.  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  He admits to skipping doses of medications accidentally as he was having trouble remembering to take them.  He is aware of the need to take the high dose of amiodarone for 2 weeks and then decrease the dose to 200 mg twice a day for 2 weeks and then 200 mg daily.  He now has a pill dispenser which helps him to remember to take the medications.  ROS: As above otherwise negative  Studies Reviewed: Marland Kitchen   EKG Interpretation Date/Time:  Friday August 18 2023 10:45:21 EST Ventricular Rate:  112 PR Interval:     QRS Duration:  92 QT Interval:  360 QTC Calculation: 491 R Axis:   84  Text Interpretation: Atrial fibrillation with rapid ventricular response ST & T wave abnormality, consider inferior ischemia When compared with ECG of 10-Aug-2023 02:32, Atrial fibrillation has replaced Sinus rhythm Vent. rate has increased BY  54 BPM ST now depressed in Inferior leads ST now depressed in Anterolateral leads T wave inversion now evident in Inferior leads Confirmed by Tommy [name] [id number]) on 08/18/2023 10:56:00 AM    Cardiac MR 04/12/2023 IMPRESSION: 1.  Normal LV size with mild concentric LV hypertrophy, EF 67%.   2.  Normal RV size and systolic function, EF 55%.   3. Normal extracellular volume percentage (25%), this is not consistent with cardiac amyloidosis.   4. No definite myocardial LGE, so no evidence for prior MI, infiltrative disease, or myocarditis.   This study is not suggestive of cardiac amyloidosis.   Tommy [name] promotion account executive    Physical Exam:   VS:  BP 134/80 (BP Location: Left Arm, Patient Position: Sitting, Cuff Size: Normal)   Pulse (!) 112   Ht 5\' 11"  (1.803 m)   Wt 186 lb 9.6 oz (84.6 kg)   SpO2 97%   BMI 26.03 kg/m    Wt Readings from Last 3 Encounters:  08/18/23 186 lb 9.6 oz (84.6 kg)  08/11/23 191  lb 9.3 oz (86.9 kg)  06/21/23 190 lb (86.2 kg)    GEN: Well nourished, well developed in no acute distress generalized tremor. NECK: No JVD; No carotid bruits CARDIAC: IRRR, tachycardic, no murmurs, rubs, gallops RESPIRATORY:  Clear to auscultation without rales, wheezing or rhonchi  ABDOMEN: Soft, non-tender, non-distended EXTREMITIES:  No edema; No deformity   ASSESSMENT AND PLAN: .    Persistent atrial fibrillation: On discharge the patient was in normal sinus rhythm.  He is now in atrial for with RVR heart rate of 112 bpm.  The patient admits to being nervous about coming here and running behind because he got lost.  He has been taking amiodarone as directed.  I have  advised him to remember to take the 400 mg (2 tablets) twice a day for 2 full weeks.  He will continue the Eliquis.  If he becomes short of breath, beginning to retain fluid, or having chest discomfort.  He is to report to the emergency room.  He is going to follow-up with electrophysiology on December 4 2 discuss A-fib ablation.  I have stressed taking Eliquis daily without missing any doses.  He verbalizes understanding.  2.  Hypertension: Blood pressure is controlled currently.  No changes in his regimen he will continue losartan and chlorthalidone.  He will have follow-up labs prior to ablation.  3.  PAD: Status post FSA stenting in 2009.  No complaints of intermittent claudication currently.  4.  Hypercholesterolemia: Remains on rosuvastatin 10 mg daily.  Goal of LDL less than 100.          Signed, Bettey Mare. Tommy [name], ANP, AACC

## 2023-08-17 ENCOUNTER — Telehealth: Payer: Self-pay | Admitting: *Deleted

## 2023-08-17 NOTE — Progress Notes (Signed)
  Care Coordination  Outreach Note  08/17/2023 Name: CORBY VANDENBERGHE MRN: [id number] DOB: May 20, 1951   Care Coordination Outreach Attempts: A successful telephone outreach was attempted today to offer the patient information about available care coordination services.   Follow Up Plan:  Additional outreach attempts will be made to offer the patient care coordination information and services.   Encounter Outcome:  Patient Request to Call Back  [facility] Coordination Care Guide  Direct Dial: [phone]

## 2023-08-17 NOTE — Progress Notes (Signed)
  Care Coordination  Outreach Note  08/17/2023 Name: AUNDRA ESPIN MRN: [id number] DOB: [birth year]   Care Coordination Outreach Attempts: An unsuccessful telephone outreach was attempted today to offer the patient information about available care coordination services.  Follow Up Plan:  Additional outreach attempts will be made to offer the patient care coordination information and services.   Encounter Outcome:  No Answer  Gwenevere Ghazi  Care Coordination Care Guide  Direct Dial: [phone]

## 2023-08-18 ENCOUNTER — Ambulatory Visit: Payer: Medicare HMO | Attending: Adult Health | Admitting: Adult Health

## 2023-08-18 ENCOUNTER — Encounter: Payer: Self-pay | Admitting: Adult Health

## 2023-08-18 VITALS — BP 134/80 | HR 112 | Ht 71.0 in | Wt 186.6 lb

## 2023-08-18 DIAGNOSIS — I4819 Other persistent atrial fibrillation: Secondary | ICD-10-CM | POA: Diagnosis not present

## 2023-08-18 DIAGNOSIS — E7800 Pure hypercholesterolemia, unspecified: Secondary | ICD-10-CM

## 2023-08-18 DIAGNOSIS — I739 Peripheral vascular disease, unspecified: Secondary | ICD-10-CM | POA: Diagnosis not present

## 2023-08-18 DIAGNOSIS — I10 Essential (primary) hypertension: Secondary | ICD-10-CM | POA: Diagnosis not present

## 2023-08-18 DIAGNOSIS — Z7901 Long term (current) use of anticoagulants: Secondary | ICD-10-CM | POA: Diagnosis not present

## 2023-08-18 NOTE — Patient Instructions (Signed)
Medication Instructions:  No Changes *If you need a refill on your cardiac medications before your next appointment, please call your pharmacy*   Lab Work: No Labs If you have labs (blood work) drawn today and your tests are completely normal, you will receive your results only by: MyChart Message (if you have MyChart) OR A paper copy in the mail If you have any lab test that is abnormal or we need to change your treatment, we will call you to review the results.   Testing/Procedures: No Testing   Follow-Up: At [facility], you and your health needs are our priority.  As part of our continuing mission to provide you with exceptional heart care, we have created designated Provider Care Teams.  These Care Teams include your primary Cardiologist (physician) and Advanced Practice Providers (APPs -  Physician Assistants and Nurse Practitioners) who all work together to provide you with the care you need, when you need it.  We recommend signing up for the patient portal called "MyChart".  Sign up information is provided on this After Visit Summary.  MyChart is used to connect with patients for Virtual Visits (Telemedicine).  Patients are able to view lab/test results, encounter notes, upcoming appointments, etc.  Non-urgent messages can be sent to your provider as well.   To learn more about what you can do with MyChart, go to [email or web address].    Your next appointment:  Keep Scheduled Appointment  Provider:    Canary Brim, NP

## 2023-08-21 NOTE — Progress Notes (Signed)
  Care Coordination  Outreach Note  08/21/2023 Name: Tommy [name] MRN: [id number] DOB: 08-Aug-1951   Care Coordination Outreach Attempts: A third unsuccessful outreach was attempted today to offer the patient with information about available care coordination services.  Follow Up Plan:  No further outreach attempts will be made at this time. We have been unable to contact the patient to offer or enroll patient in care coordination services  Encounter Outcome:  No Answer  Gwenevere Ghazi  Care Coordination Care Guide  Direct Dial: [phone]

## 2023-08-23 ENCOUNTER — Encounter: Payer: Self-pay | Admitting: Family Medicine

## 2023-08-23 ENCOUNTER — Ambulatory Visit: Payer: Medicare HMO | Admitting: Family Medicine

## 2023-08-23 VITALS — BP 132/88 | HR 87 | Temp 98.3°F | Ht 71.0 in | Wt 185.0 lb

## 2023-08-23 DIAGNOSIS — Z9181 History of falling: Secondary | ICD-10-CM | POA: Diagnosis not present

## 2023-08-23 DIAGNOSIS — R251 Tremor, unspecified: Secondary | ICD-10-CM | POA: Diagnosis not present

## 2023-08-23 DIAGNOSIS — R42 Dizziness and giddiness: Secondary | ICD-10-CM

## 2023-08-23 DIAGNOSIS — R55 Syncope and collapse: Secondary | ICD-10-CM

## 2023-08-23 DIAGNOSIS — I480 Paroxysmal atrial fibrillation: Secondary | ICD-10-CM | POA: Diagnosis not present

## 2023-08-23 NOTE — Progress Notes (Signed)
BP 132/88   Pulse 87   Temp 98.3 F (36.8 C)   Ht 5\' 11"  (1.803 m)   Wt 185 lb (83.9 kg)   SpO2 96%   BMI 25.80 kg/m    Subjective:   Patient ID: Tommy [name], male    DOB: 1951-03-28, 72 y.o.   MRN: [id number]  HPI: Tommy [name] is a 72 y.o. male presenting on 08/23/2023 for Hospitalization Follow-up, Tremors (Worse since staring amidoderone), and Atrial Fibrillation   HPI Hospital follow-up/transition of care. Patient is coming in today for hospital follow-up/transition of care.  He was diagnosed with syncope and A-fib.  He had had A-fib before but his heart rate started fluctuating more and had dizziness and a fall.  And he hit his head on a truck and had loss of consciousness and that is why he was brought in.  He was admitted on 08/09/2023 and discharged on 08/11/2023.  Patient was contacted by Tommy Gross, RN for transition of care phone visit on 08/14/2023. Send sleep in the hospital he feels like his heart rates been under control and he is feeling a lot better.  He denies any lightheadedness or dizziness or syncope.  He feels like he is doing well on this medicine that has been on for 2 weeks.  They did start the amiodarone.  The only issue that he has been having since he started it is been having some tremors.  Relevant past medical, surgical, family and social history reviewed and updated as indicated. Interim medical history since our last visit reviewed. Allergies and medications reviewed and updated.  Review of Systems  Constitutional:  Negative for chills and fever.  Eyes:  Negative for visual disturbance.  Respiratory:  Negative for shortness of breath and wheezing.   Cardiovascular:  Positive for palpitations. Negative for chest pain and leg swelling.  Musculoskeletal:  Negative for back pain and gait problem.  Skin:  Negative for rash.  Neurological:  Negative for dizziness, weakness and light-headedness.  All other systems reviewed and are negative.   Per  HPI unless specifically indicated above   Allergies as of 08/23/2023       Reactions   Penicillins Swelling        Medication List        Accurate as of August 23, 2023 10:48 AM. If you have any questions, ask your nurse or doctor.          amiodarone 200 MG tablet Commonly known as: PACERONE Take 2 tablets (400 mg total) by mouth 2 (two) times daily for 13 days, THEN 1 tablet (200 mg total) 2 (two) times daily for 14 days, THEN 1 tablet (200 mg total) daily. Start taking on: August 11, 2023   amLODipine 10 MG tablet Commonly known as: NORVASC Take 1 tablet (10 mg total) by mouth daily.   aspirin EC 81 MG tablet Take 1 tablet (81 mg total) by mouth daily. Swallow whole.   chlorthalidone 25 MG tablet Commonly known as: HYGROTON Take 1 tablet (25 mg total) by mouth daily.   Eliquis 5 MG Tabs tablet Generic drug: apixaban TAKE 1 TABLET TWICE DAILY   fenofibrate 145 MG tablet Commonly known as: TRICOR Take 1 tablet (145 mg total) by mouth daily.   losartan 50 MG tablet Commonly known as: COZAAR Take 1 tablet (50 mg total) by mouth daily.   rosuvastatin 10 MG tablet Commonly known as: CRESTOR Take 1 tablet (10 mg total) by mouth every evening.  tamsulosin 0.4 MG Caps capsule Commonly known as: FLOMAX Take 1 capsule (0.4 mg total) by mouth daily after supper.         Objective:   BP 132/88   Pulse 87   Temp 98.3 F (36.8 C)   Ht 5\' 11"  (1.803 m)   Wt 185 lb (83.9 kg)   SpO2 96%   BMI 25.80 kg/m   Wt Readings from Last 3 Encounters:  08/23/23 185 lb (83.9 kg)  08/18/23 186 lb 9.6 oz (84.6 kg)  08/11/23 191 lb 9.3 oz (86.9 kg)    Physical Exam Vitals and nursing note reviewed.  Constitutional:      General: He is not in acute distress.    Appearance: He is well-developed. He is not diaphoretic.  Eyes:     General: No scleral icterus.    Conjunctiva/sclera: Conjunctivae normal.  Neck:     Thyroid: No thyromegaly.  Cardiovascular:      Rate and Rhythm: Normal rate. Rhythm irregular.     Heart sounds: Normal heart sounds. No murmur heard. Pulmonary:     Effort: Pulmonary effort is normal. No respiratory distress.     Breath sounds: Normal breath sounds. No wheezing.  Musculoskeletal:        General: No swelling. Normal range of motion.     Cervical back: Neck supple.  Lymphadenopathy:     Cervical: No cervical adenopathy.  Skin:    General: Skin is warm and dry.     Findings: No rash.  Neurological:     Mental Status: He is alert and oriented to person, place, and time.     Coordination: Coordination normal.  Psychiatric:        Behavior: Behavior normal.       Assessment & Plan:   Problem List Items Addressed This Visit       Cardiovascular and Mediastinum   Atrial fibrillation (HCC)   Relevant Orders   CBC with Differential/Platelet   CMP14+EGFR     Other   History of recent fall - Primary   Relevant Orders   CBC with Differential/Platelet   CMP14+EGFR   Dizziness, nonspecific   Relevant Orders   CBC with Differential/Platelet   CMP14+EGFR   Other Visit Diagnoses     Tremor of both hands           Continue current medicine, will check some blood work today.  Discussed talking to cardiology about his tremor and the amiodarone and weigh risks and benefits with them.  It looks like he is possibly going towards an ablation and that may help as well.  No changes on our end, he is feeling a lot better Follow up plan: Return if symptoms worsen or fail to improve.  Counseling provided for all of the vaccine components Orders Placed This Encounter  Procedures   CBC with Differential/Platelet   CMP14+EGFR    Arville Care, MD [facility] 08/23/2023, 10:48 AM

## 2023-08-24 ENCOUNTER — Telehealth: Payer: Self-pay | Admitting: Family Medicine

## 2023-08-24 LAB — CBC WITH DIFFERENTIAL/PLATELET
Basophils Absolute: 0 10*3/uL (ref 0.0–0.2)
Basos: 1 %
EOS (ABSOLUTE): 0.1 10*3/uL (ref 0.0–0.4)
Eos: 2 %
Hematocrit: 43.3 % (ref 37.5–51.0)
Hemoglobin: 14.5 g/dL (ref 13.0–17.7)
Immature Grans (Abs): 0 10*3/uL (ref 0.0–0.1)
Immature Granulocytes: 1 %
Lymphocytes Absolute: 1.2 10*3/uL (ref 0.7–3.1)
Lymphs: 27 %
MCH: 29.4 pg (ref 26.6–33.0)
MCHC: 33.5 g/dL (ref 31.5–35.7)
MCV: 88 fL (ref 79–97)
Monocytes Absolute: 0.3 10*3/uL (ref 0.1–0.9)
Monocytes: 7 %
Neutrophils Absolute: 2.7 10*3/uL (ref 1.4–7.0)
Neutrophils: 62 %
Platelets: 180 10*3/uL (ref 150–450)
RBC: 4.94 x10E6/uL (ref 4.14–5.80)
RDW: 12.3 % (ref 11.6–15.4)
WBC: 4.4 10*3/uL (ref 3.4–10.8)

## 2023-08-24 LAB — CMP14+EGFR
ALT: 22 [IU]/L (ref 0–44)
AST: 21 [IU]/L (ref 0–40)
Albumin: 4.2 g/dL (ref 3.8–4.8)
Alkaline Phosphatase: 31 IU/L — ABNORMAL LOW (ref 44–121)
BUN/Creatinine Ratio: 14 (ref 10–24)
BUN: 26 mg/dL (ref 8–27)
Bilirubin Total: 1.1 mg/dL (ref 0.0–1.2)
CO2: 26 mmol/L (ref 20–29)
Calcium: 10.9 mg/dL — ABNORMAL HIGH (ref 8.6–10.2)
Chloride: 107 mmol/L — ABNORMAL HIGH (ref 96–106)
Creatinine, Ser: 1.86 mg/dL — ABNORMAL HIGH (ref 0.76–1.27)
Globulin, Total: 2.5 g/dL (ref 1.5–4.5)
Glucose: 143 mg/dL — ABNORMAL HIGH (ref 70–99)
Potassium: 4.7 mmol/L (ref 3.5–5.2)
Sodium: 146 mmol/L — ABNORMAL HIGH (ref 134–144)
Total Protein: 6.7 g/dL (ref 6.0–8.5)
eGFR: 38 mL/min/{1.73_m2} — ABNORMAL LOW (ref >59)

## 2023-08-24 MED ORDER — CHLORTHALIDONE 25 MG PO TABS: 25.0000 mg | ORAL_TABLET | Freq: Every day | ORAL | 1 refills | Status: DC

## 2023-08-24 NOTE — Telephone Encounter (Signed)
Pt confirmed that he no longer takes hydrochlorothiazide but he does need the chlorthalidone sent to [facility]. Rx sent at last visit went to [facility].

## 2023-08-24 NOTE — Telephone Encounter (Signed)
Left message making pt aware that hydrochlorothiazide is not on his current med list so that is why [facility] does not have it. [name] did refill chlorthalidone at last visit. Asked pt to return call for clarification.

## 2023-08-24 NOTE — Telephone Encounter (Signed)
Copied from CRM [id number]. Topic: Clinical - Medication Question >> Aug 24, 2023  8:27 AM Desma Mcgregor wrote: Reason for CRM: Judeth Cornfield looking to get clarification on the Pt's medications, hydrochlorothiazide 25 MG tablet and chlorthalidone (HYGROTON) 25 MG tablet. Pt got one Rx filled and now trying to get the other filled today. Please f/u asap CB# [phone]

## 2023-09-01 ENCOUNTER — Other Ambulatory Visit: Payer: Self-pay

## 2023-09-01 ENCOUNTER — Other Ambulatory Visit (HOSPITAL_COMMUNITY): Payer: Self-pay

## 2023-09-01 DIAGNOSIS — N289 Disorder of kidney and ureter, unspecified: Secondary | ICD-10-CM

## 2023-09-12 NOTE — Progress Notes (Unsigned)
Electrophysiology Office Note:   Date:  09/13/2023  ID:  Tommy [name], DOB 1951-06-22, MRN [id number]  Primary Cardiologist: Rollene Rotunda, MD Electrophysiologist: None      History of Present Illness:   Tommy [name] is a 72 y.o. male with h/o AF, HTN, PAD s/p SFA stenting 2009, & DVT seen today for routine electrophysiology followup.   Admit 10/30-11/1/24 with dizziness and fall 5 days prior to admit. EKG notable for [facility].  Initiated amiodarone as bridge to ablation.   Since last being seen in our clinic the patient reports he has been doing ok.  He continues to have occasional dizziness.  Has noted a tremor in his hands since starting amiodarone. He is unaware of AF.  No syncopal episodes or near syncope since admission. Daughter accompanies him today.   He denies chest pain, palpitations, dyspnea, PND, orthopnea, nausea, vomiting, dizziness, syncope, edema, weight gain, or early satiety.   Review of systems complete and found to be negative unless listed in HPI.   EP Information / Studies Reviewed:    EKG is not ordered today. EKG from 08/18/23 reviewed which showed AF with ventricular rate 112 bpm      Studies:  Cardiac Monitor 2023 > 3% AF burden with average rate of 124bpm, 12 pauses with longest lasting 4.5 seconds  ECHO 10/2022 > LVEF 65-70%, no RWMA, severe concentric LVH, G1DD, RV systolic function is hyperdynamic, LA mildly dilated  cMRI 04/2023 > no evidence of cardiac amyloid, LVEF 67%, no LGE    Arrhythmia / AAD Paroxysmal AF > initial dx ~ 2023   Amiodarone > started 08/2023 as bridge to ablation    Risk Assessment/Calculations:    CHA2DS2-VASc Score = 3   This indicates a 3.2% annual risk of stroke. The patient's score is based upon: CHF History: 0 HTN History: 1 Diabetes History: 0 Stroke History: 0 Vascular Disease History: 1 Age Score: 1 Gender Score: 0             Physical Exam:   VS:  BP 122/60 (BP Location: Left Arm, Patient Position: Sitting,  Cuff Size: Normal)   Pulse 83   Ht 5\' 11"  (1.803 m)   Wt 190 lb 12.8 oz (86.5 kg)   SpO2 94%   BMI 26.61 kg/m    Wt Readings from Last 3 Encounters:  09/13/23 190 lb 12.8 oz (86.5 kg)  08/23/23 185 lb (83.9 kg)  08/18/23 186 lb 9.6 oz (84.6 kg)     GEN: Well nourished, well developed in no acute distress NECK: No JVD; No carotid bruits CARDIAC: Irregularly irregular rate and rhythm, suspected AF by auscultation, no murmurs, rubs, gallops RESPIRATORY:  Clear to auscultation without rales, wheezing or rhonchi  ABDOMEN: Soft, non-tender, non-distended EXTREMITIES:  No edema; No deformity   ASSESSMENT AND PLAN:    Paroxysmal Atrial Fibrillation   Post Conversion Pause with Questionable Syncope  Tachy-Brady Syndrome  CHA2DS2-VASc 3 / 3.2% annual risk CVA.  TSH wnl. 2023 LTM with 3% burden of AF, known 4 second pauses   -pending AF ablation with Dr. Elberta Fortis on 12/16 -Cardiac CT with pulmonary morphology scheduled for 12/6 -continue amiodarone as bridge to ablation -[facility] for stroke prophylaxis  -reviewed ablation procedure, NPO after MN on 12/16, do not hold eliquis before procedure etc. Paperwork given to patient.  Discussed risks/benefits with daughter present.  -pre-procedure labs today   Secondary Hypercoagulable State  -continue eliquis, hold for procedure / instructions given   Hypertension  -well controlled  on current regimen  -Toprol stopped during 08/2023 admit due to pauses   PAD  S/p SFA stenting  -follows with [facility]   Suspected OSA  -consider sleep study post ablation   DVT Hx  -remote hx in ~2009   Follow up with Dr. Elberta Fortis  as planned for ablation 12/16   Signed, Canary Brim, MSN, APRN, NP-C, AGACNP-BC [facility] - Electrophysiology  09/13/2023, 10:27 AM

## 2023-09-12 NOTE — H&P (View-Only) (Signed)
Electrophysiology Office Note:   Date:  09/13/2023  ID:  Tommy [name], DOB Aug 17, 1951, MRN [id number]  Primary Cardiologist: Rollene Rotunda, MD Electrophysiologist: None      History of Present Illness:   Tommy [name] is a 72 y.o. male with h/o AF, HTN, PAD s/p SFA stenting 2009, & DVT seen today for routine electrophysiology followup.   Admit 10/30-11/1/24 with dizziness and fall 5 days prior to admit. EKG notable for [facility].  Initiated amiodarone as bridge to ablation.   Since last being seen in our clinic the patient reports he has been doing ok.  He continues to have occasional dizziness.  Has noted a tremor in his hands since starting amiodarone. He is unaware of AF.  No syncopal episodes or near syncope since admission. Daughter accompanies him today.   He denies chest pain, palpitations, dyspnea, PND, orthopnea, nausea, vomiting, dizziness, syncope, edema, weight gain, or early satiety.   Review of systems complete and found to be negative unless listed in HPI.   EP Information / Studies Reviewed:    EKG is not ordered today. EKG from 08/18/23 reviewed which showed AF with ventricular rate 112 bpm      Studies:  Cardiac Monitor 2023 > 3% AF burden with average rate of 124bpm, 12 pauses with longest lasting 4.5 seconds  ECHO 10/2022 > LVEF 65-70%, no RWMA, severe concentric LVH, G1DD, RV systolic function is hyperdynamic, LA mildly dilated  cMRI 04/2023 > no evidence of cardiac amyloid, LVEF 67%, no LGE    Arrhythmia / AAD Paroxysmal AF > initial dx ~ 2023   Amiodarone > started 08/2023 as bridge to ablation    Risk Assessment/Calculations:    CHA2DS2-VASc Score = 3   This indicates a 3.2% annual risk of stroke. The patient's score is based upon: CHF History: 0 HTN History: 1 Diabetes History: 0 Stroke History: 0 Vascular Disease History: 1 Age Score: 1 Gender Score: 0             Physical Exam:   VS:  BP 122/60 (BP Location: Left Arm, Patient Position: Sitting,  Cuff Size: Normal)   Pulse 83   Ht 5\' 11"  (1.803 m)   Wt 190 lb 12.8 oz (86.5 kg)   SpO2 94%   BMI 26.61 kg/m    Wt Readings from Last 3 Encounters:  09/13/23 190 lb 12.8 oz (86.5 kg)  08/23/23 185 lb (83.9 kg)  08/18/23 186 lb 9.6 oz (84.6 kg)     GEN: Well nourished, well developed in no acute distress NECK: No JVD; No carotid bruits CARDIAC: Irregularly irregular rate and rhythm, suspected AF by auscultation, no murmurs, rubs, gallops RESPIRATORY:  Clear to auscultation without rales, wheezing or rhonchi  ABDOMEN: Soft, non-tender, non-distended EXTREMITIES:  No edema; No deformity   ASSESSMENT AND PLAN:    Paroxysmal Atrial Fibrillation   Post Conversion Pause with Questionable Syncope  Tachy-Brady Syndrome  CHA2DS2-VASc 3 / 3.2% annual risk CVA.  TSH wnl. 2023 LTM with 3% burden of AF, known 4 second pauses   -pending AF ablation with Dr. Elberta Fortis on 12/16 -Cardiac CT with pulmonary morphology scheduled for 12/6 -continue amiodarone as bridge to ablation -[facility] for stroke prophylaxis  -reviewed ablation procedure, NPO after MN on 12/16, do not hold eliquis before procedure etc. Paperwork given to patient.  Discussed risks/benefits with daughter present.  -pre-procedure labs today   Secondary Hypercoagulable State  -continue eliquis, hold for procedure / instructions given   Hypertension  -well controlled  on current regimen  -Toprol stopped during 08/2023 admit due to pauses   PAD  S/p SFA stenting  -follows with [facility]   Suspected OSA  -consider sleep study post ablation   DVT Hx  -remote hx in ~2009   Follow up with Dr. Elberta Fortis  as planned for ablation 12/16   Signed, Canary Brim, MSN, APRN, NP-C, AGACNP-BC [facility] - Electrophysiology  09/13/2023, 10:27 AM

## 2023-09-13 ENCOUNTER — Ambulatory Visit: Payer: Medicare HMO | Attending: Pulmonary Disease | Admitting: Pulmonary Disease

## 2023-09-13 ENCOUNTER — Encounter: Payer: Self-pay | Admitting: Pulmonary Disease

## 2023-09-13 VITALS — BP 122/60 | HR 83 | Ht 71.0 in | Wt 190.8 lb

## 2023-09-13 DIAGNOSIS — D6869 Other thrombophilia: Secondary | ICD-10-CM | POA: Diagnosis not present

## 2023-09-13 DIAGNOSIS — E785 Hyperlipidemia, unspecified: Secondary | ICD-10-CM

## 2023-09-13 DIAGNOSIS — I480 Paroxysmal atrial fibrillation: Secondary | ICD-10-CM | POA: Diagnosis not present

## 2023-09-13 DIAGNOSIS — I739 Peripheral vascular disease, unspecified: Secondary | ICD-10-CM | POA: Diagnosis not present

## 2023-09-13 NOTE — Patient Instructions (Addendum)
Medication Instructions:  Your physician recommends that you continue on your current medications as directed. Please refer to the Current Medication list given to you today.  *If you need a refill on your cardiac medications before your next appointment, please call your pharmacy*  Lab Work: CMET, CBC, TSH, Free T4-TODAY If you have labs (blood work) drawn today and your tests are completely normal, you will receive your results only by: MyChart Message (if you have MyChart) OR A paper copy in the mail If you have any lab test that is abnormal or we need to change your treatment, we will call you to review the results.   Testing/Procedures: See letters   Follow-Up: At [facility], you and your health needs are our priority.  As part of our continuing mission to provide you with exceptional heart care, we have created designated Provider Care Teams.  These Care Teams include your primary Cardiologist (physician) and Advanced Practice Providers (APPs -  Physician Assistants and Nurse Practitioners) who all work together to provide you with the care you need, when you need it.  Your next appointment:   Your appointments will be scheduled for you and print out on your discharge summary.

## 2023-09-14 ENCOUNTER — Telehealth (HOSPITAL_COMMUNITY): Payer: Self-pay | Admitting: *Deleted

## 2023-09-14 LAB — CBC
Hematocrit: 42.4 % (ref 37.5–51.0)
Hemoglobin: 13.8 g/dL (ref 13.0–17.7)
MCH: 29 pg (ref 26.6–33.0)
MCHC: 32.5 g/dL (ref 31.5–35.7)
MCV: 89 fL (ref 79–97)
Platelets: 159 10*3/uL (ref 150–450)
RBC: 4.76 x10E6/uL (ref 4.14–5.80)
RDW: 12.4 % (ref 11.6–15.4)
WBC: 3.9 10*3/uL (ref 3.4–10.8)

## 2023-09-14 LAB — COMPREHENSIVE METABOLIC PANEL
ALT: 30 [IU]/L (ref 0–44)
AST: 26 [IU]/L (ref 0–40)
Albumin: 4.3 g/dL (ref 3.8–4.8)
Alkaline Phosphatase: 28 [IU]/L — ABNORMAL LOW (ref 44–121)
BUN/Creatinine Ratio: 19 (ref 10–24)
BUN: 28 mg/dL — ABNORMAL HIGH (ref 8–27)
Bilirubin Total: 1.2 mg/dL (ref 0.0–1.2)
CO2: 27 mmol/L (ref 20–29)
Calcium: 10.4 mg/dL — ABNORMAL HIGH (ref 8.6–10.2)
Chloride: 102 mmol/L (ref 96–106)
Creatinine, Ser: 1.51 mg/dL — ABNORMAL HIGH (ref 0.76–1.27)
Globulin, Total: 2.4 g/dL (ref 1.5–4.5)
Glucose: 108 mg/dL — ABNORMAL HIGH (ref 70–99)
Potassium: 4.1 mmol/L (ref 3.5–5.2)
Sodium: 141 mmol/L (ref 134–144)
Total Protein: 6.7 g/dL (ref 6.0–8.5)
eGFR: 48 mL/min/{1.73_m2} — ABNORMAL LOW (ref >59)

## 2023-09-14 LAB — T4, FREE: Free T4: 1.66 ng/dL (ref 0.82–1.77)

## 2023-09-14 LAB — TSH: TSH: 2.07 u[IU]/mL (ref 0.450–4.500)

## 2023-09-14 NOTE — Telephone Encounter (Signed)
Attempted to call patient regarding upcoming cardiac CT appointment. Left message on voicemail with name and callback number Hayley Sharpe RN Navigator Cardiac Imaging [facility] [phone] Office   

## 2023-09-15 ENCOUNTER — Other Ambulatory Visit: Payer: Self-pay

## 2023-09-15 ENCOUNTER — Ambulatory Visit (HOSPITAL_COMMUNITY)
Admission: RE | Admit: 2023-09-15 | Discharge: 2023-09-15 | Disposition: A | Discharge status: Home / Self Care | Payer: Medicare HMO | Source: Ambulatory Visit | Attending: Family Medicine

## 2023-09-15 DIAGNOSIS — I480 Paroxysmal atrial fibrillation: Secondary | ICD-10-CM | POA: Diagnosis not present

## 2023-09-15 MED ORDER — IOHEXOL 350 MG/ML SOLN
100.0000 mL | Freq: Once | INTRAVENOUS | Status: AC | PRN
  Administered 2023-09-15: 100 mL via INTRAVENOUS

## 2023-09-15 MED ORDER — AMIODARONE HCL 200 MG PO TABS: ORAL_TABLET | ORAL | 0 refills | Status: DC

## 2023-09-15 MED ORDER — LOSARTAN POTASSIUM 50 MG PO TABS: 50.0000 mg | ORAL_TABLET | Freq: Every day | ORAL | 1 refills | Status: DC

## 2023-09-21 ENCOUNTER — Encounter: Payer: Self-pay | Admitting: Family Medicine

## 2023-09-21 ENCOUNTER — Ambulatory Visit: Payer: Medicare HMO | Admitting: Family Medicine

## 2023-09-21 VITALS — BP 108/51 | HR 52 | Ht 71.0 in | Wt 191.0 lb

## 2023-09-21 DIAGNOSIS — E785 Hyperlipidemia, unspecified: Secondary | ICD-10-CM

## 2023-09-21 DIAGNOSIS — I739 Peripheral vascular disease, unspecified: Secondary | ICD-10-CM | POA: Diagnosis not present

## 2023-09-21 DIAGNOSIS — R7303 Prediabetes: Secondary | ICD-10-CM | POA: Diagnosis not present

## 2023-09-21 DIAGNOSIS — I2583 Coronary atherosclerosis due to lipid rich plaque: Secondary | ICD-10-CM | POA: Diagnosis not present

## 2023-09-21 DIAGNOSIS — I10 Essential (primary) hypertension: Secondary | ICD-10-CM | POA: Diagnosis not present

## 2023-09-21 DIAGNOSIS — I2510 Atherosclerotic heart disease of native coronary artery without angina pectoris: Secondary | ICD-10-CM | POA: Diagnosis not present

## 2023-09-21 LAB — BAYER DCA HB A1C WAIVED: HB A1C (BAYER DCA - WAIVED): 5.2 % (ref 4.8–5.6)

## 2023-09-21 NOTE — Progress Notes (Signed)
BP (!) 108/51   Pulse (!) 52   Ht 5\' 11"  (1.803 m)   Wt 191 Tommy (86.6 kg)   SpO2 95%   BMI 26.64 kg/m    Subjective:   Patient ID: Tommy [name], male    DOB: [birth year], 72 y.o.   MRN: [id number]  HPI: Tommy [name] is a 72 y.o. male presenting on 09/21/2023 for Medical Management of Chronic Issues, Hypertension, Hyperlipidemia, and Prediabetes   HPI Prediabetes Patient comes in today for recheck of his diabetes. Patient has been currently taking no medicine currently. Patient is currently on an ACE inhibitor/ARB. Patient has not seen an ophthalmologist this year. Patient denies any new issues with their feet. The symptom started onset as an adult hypertension and hyperlipidemia and CAD and PAD here with ARE RELATED TO DM   Hypertension Patient is currently on losartan and chlorthalidone and amlodipine and amiodarone, and their blood pressure today is normal hyperlipidemia. Patient denies any lightheadedness or dizziness. Patient denies headaches, blurred vision, chest pains, shortness of breath, or weakness. Denies any side effects from medication and is content with current medication.   Hyperlipidemia Patient is coming in for recheck of his hyperlipidemia. The patient is currently taking Crestor and fenofibrate. They deny any issues with myalgias or history of liver damage from it. They deny any focal numbness or weakness or chest pain.   Relevant past medical, surgical, family and social history reviewed and updated as indicated. Interim medical history since our last visit reviewed. Allergies and medications reviewed and updated.  Review of Systems  Constitutional:  Negative for chills and fever.  Eyes:  Negative for visual disturbance.  Respiratory:  Negative for shortness of breath and wheezing.   Cardiovascular:  Negative for chest pain and leg swelling.  Musculoskeletal:  Negative for back pain and gait problem.  Skin:  Negative for rash.  Neurological:  Negative for  dizziness and light-headedness.  All other systems reviewed and are negative.   Per HPI unless specifically indicated above   Allergies as of 09/21/2023       Reactions   Penicillins Swelling        Medication List        Accurate as of September 21, 2023  8:30 AM. If you have any questions, ask your nurse or doctor.          amiodarone 200 MG tablet Commonly known as: PACERONE Take 2 tablets (400 mg total) by mouth 2 (two) times daily for 13 days, THEN 1 tablet (200 mg total) 2 (two) times daily for 14 days, THEN 1 tablet (200 mg total) daily. Start taking on: September 15, 2023 What changed: See the new instructions.   amLODipine 10 MG tablet Commonly known as: NORVASC Take 1 tablet (10 mg total) by mouth daily.   aspirin EC 81 MG tablet Take 81 mg by mouth daily. Swallow whole.   chlorthalidone 25 MG tablet Commonly known as: HYGROTON Take 1 tablet (25 mg total) by mouth daily.   Eliquis 5 MG Tabs tablet Generic drug: apixaban TAKE 1 TABLET TWICE DAILY   fenofibrate 145 MG tablet Commonly known as: TRICOR Take 1 tablet (145 mg total) by mouth daily.   losartan 50 MG tablet Commonly known as: COZAAR Take 1 tablet (50 mg total) by mouth daily.   rosuvastatin 10 MG tablet Commonly known as: CRESTOR Take 1 tablet (10 mg total) by mouth every evening.   tamsulosin 0.4 MG Caps capsule Commonly known as: FLOMAX  Take 1 capsule (0.4 mg total) by mouth daily after supper.         Objective:   BP (!) 108/51   Pulse (!) 52   Ht 5\' 11"  (1.803 m)   Wt 191 Tommy (86.6 kg)   SpO2 95%   BMI 26.64 kg/m   Wt Readings from Last 3 Encounters:  09/21/23 191 Tommy (86.6 kg)  09/13/23 190 Tommy 12.8 oz (86.5 kg)  08/23/23 185 Tommy (83.9 kg)    Physical Exam Vitals and nursing note reviewed.  Constitutional:      General: He is not in acute distress.    Appearance: He is well-developed. He is not diaphoretic.  Eyes:     General: No scleral icterus.     Conjunctiva/sclera: Conjunctivae normal.  Neck:     Thyroid: No thyromegaly.  Cardiovascular:     Rate and Rhythm: Normal rate. Rhythm irregular.     Heart sounds: Normal heart sounds. No murmur heard. Pulmonary:     Effort: Pulmonary effort is normal. No respiratory distress.     Breath sounds: Normal breath sounds. No wheezing.  Musculoskeletal:        General: No swelling. Normal range of motion.     Cervical back: Neck supple.  Lymphadenopathy:     Cervical: No cervical adenopathy.  Skin:    General: Skin is warm and dry.     Findings: No rash.  Neurological:     Mental Status: He is alert and oriented to person, place, and time.     Coordination: Coordination normal.  Psychiatric:        Behavior: Behavior normal.     Results for orders placed or performed in visit on 09/13/23  Comp Met (CMET)   Collection Time: 09/13/23 10:19 AM  Result Value Ref Range   Glucose 108 (H) 70 - 99 mg/dL   BUN 28 (H) 8 - 27 mg/dL   Creatinine, Ser 7.84 (H) 0.76 - 1.27 mg/dL   eGFR 48 (L) >69 GE/XBM/8.41   BUN/Creatinine Ratio 19 10 - 24   Sodium 141 134 - 144 mmol/L   Potassium 4.1 3.5 - 5.2 mmol/L   Chloride 102 96 - 106 mmol/L   CO2 27 20 - 29 mmol/L   Calcium 10.4 (H) 8.6 - 10.2 mg/dL   Total Protein 6.7 6.0 - 8.5 g/dL   Albumin 4.3 3.8 - 4.8 g/dL   Globulin, Total 2.4 1.5 - 4.5 g/dL   Bilirubin Total 1.2 0.0 - 1.2 mg/dL   Alkaline Phosphatase 28 (L) 44 - 121 IU/L   AST 26 0 - 40 IU/L   ALT 30 0 - 44 IU/L  CBC   Collection Time: 09/13/23 10:19 AM  Result Value Ref Range   WBC 3.9 3.4 - 10.8 x10E3/uL   RBC 4.76 4.14 - 5.80 x10E6/uL   Hemoglobin 13.8 13.0 - 17.7 g/dL   Hematocrit 32.4 40.1 - 51.0 %   MCV 89 79 - 97 fL   MCH 29.0 26.6 - 33.0 pg   MCHC 32.5 31.5 - 35.7 g/dL   RDW 02.7 25.3 - 66.4 %   Platelets 159 150 - 450 x10E3/uL  TSH   Collection Time: 09/13/23 10:19 AM  Result Value Ref Range   TSH 2.070 0.450 - 4.500 uIU/mL  T4, free   Collection Time: 09/13/23  10:19 AM  Result Value Ref Range   Free T4 1.66 0.82 - 1.77 ng/dL    Assessment & Plan:   Problem List Items Addressed  This Visit       Cardiovascular and Mediastinum   Essential hypertension   PAD (peripheral artery disease) (HCC)   Relevant Orders   Lipid panel   CAD (coronary artery disease)   Relevant Orders   Lipid panel     Other   Hyperlipidemia LDL goal <130 - Primary   Relevant Orders   Lipid panel   Prediabetes   Relevant Orders   Bayer DCA Hb A1c Waived    Blood work done recently with cardiology shows kidney function was stable.  Will check lipid and A1c today.  Otherwise he seems to be doing relatively well with blood pressure and everything, still has a regular heart rate and headed for an ablation next week. Follow up plan: Return in about 6 months (around 03/21/2024), or if symptoms worsen or fail to improve, for Prediabetes hypertension and hyperlipidemia.  Counseling provided for all of the vaccine components Orders Placed This Encounter  Procedures   Bayer DCA Hb A1c Waived   Lipid panel    Arville Care, MD [facility] 09/21/2023, 8:30 AM

## 2023-09-22 ENCOUNTER — Encounter: Payer: Self-pay | Admitting: General Practice

## 2023-09-22 LAB — LIPID PANEL
Chol/HDL Ratio: 3 {ratio} (ref 0.0–5.0)
Cholesterol, Total: 118 mg/dL (ref 100–199)
HDL: 40 mg/dL (ref >39)
LDL Chol Calc (NIH): 63 mg/dL (ref 0–99)
Triglycerides: 70 mg/dL (ref 0–149)
VLDL Cholesterol Cal: 15 mg/dL (ref 5–40)

## 2023-09-25 ENCOUNTER — Ambulatory Visit (HOSPITAL_COMMUNITY): Payer: Self-pay

## 2023-09-25 ENCOUNTER — Ambulatory Visit (HOSPITAL_BASED_OUTPATIENT_CLINIC_OR_DEPARTMENT_OTHER): Payer: Medicare HMO

## 2023-09-25 ENCOUNTER — Ambulatory Visit (HOSPITAL_COMMUNITY)
Admission: RE | Admit: 2023-09-25 | Discharge: 2023-09-25 | Disposition: A | Discharge status: Home / Self Care | Payer: Medicare HMO | Attending: Cardiology | Admitting: Cardiology

## 2023-09-25 ENCOUNTER — Ambulatory Visit (HOSPITAL_COMMUNITY)
Admission: RE | Disposition: A | Discharge status: Home / Self Care | Payer: Self-pay | Source: Home / Self Care | Attending: Cardiology

## 2023-09-25 ENCOUNTER — Other Ambulatory Visit: Payer: Self-pay

## 2023-09-25 DIAGNOSIS — I2510 Atherosclerotic heart disease of native coronary artery without angina pectoris: Secondary | ICD-10-CM | POA: Diagnosis not present

## 2023-09-25 DIAGNOSIS — I10 Essential (primary) hypertension: Secondary | ICD-10-CM | POA: Diagnosis not present

## 2023-09-25 DIAGNOSIS — Z87891 Personal history of nicotine dependence: Secondary | ICD-10-CM | POA: Diagnosis not present

## 2023-09-25 DIAGNOSIS — Z7901 Long term (current) use of anticoagulants: Secondary | ICD-10-CM | POA: Insufficient documentation

## 2023-09-25 DIAGNOSIS — I4891 Unspecified atrial fibrillation: Secondary | ICD-10-CM

## 2023-09-25 DIAGNOSIS — I495 Sick sinus syndrome: Secondary | ICD-10-CM | POA: Insufficient documentation

## 2023-09-25 DIAGNOSIS — Z79899 Other long term (current) drug therapy: Secondary | ICD-10-CM | POA: Insufficient documentation

## 2023-09-25 DIAGNOSIS — Z86718 Personal history of other venous thrombosis and embolism: Secondary | ICD-10-CM | POA: Diagnosis not present

## 2023-09-25 DIAGNOSIS — I739 Peripheral vascular disease, unspecified: Secondary | ICD-10-CM | POA: Diagnosis not present

## 2023-09-25 DIAGNOSIS — D6869 Other thrombophilia: Secondary | ICD-10-CM | POA: Insufficient documentation

## 2023-09-25 DIAGNOSIS — I480 Paroxysmal atrial fibrillation: Secondary | ICD-10-CM | POA: Diagnosis not present

## 2023-09-25 DIAGNOSIS — K219 Gastro-esophageal reflux disease without esophagitis: Secondary | ICD-10-CM | POA: Diagnosis not present

## 2023-09-25 DIAGNOSIS — E785 Hyperlipidemia, unspecified: Secondary | ICD-10-CM | POA: Diagnosis not present

## 2023-09-25 HISTORY — PX: ATRIAL FIBRILLATION ABLATION: EP1191

## 2023-09-25 SURGERY — ATRIAL FIBRILLATION ABLATION
Anesthesia: General

## 2023-09-25 MED ORDER — LIDOCAINE 2% (20 MG/ML) 5 ML SYRINGE
INTRAMUSCULAR | Status: DC | PRN
  Administered 2023-09-25: 60 mg via INTRAVENOUS

## 2023-09-25 MED ORDER — HEPARIN (PORCINE) IN NACL 1000-0.9 UT/500ML-% IV SOLN
INTRAVENOUS | Status: DC | PRN
  Administered 2023-09-25 (×3): 500 mL

## 2023-09-25 MED ORDER — ATROPINE SULFATE 1 MG/10ML IJ SOSY
PREFILLED_SYRINGE | INTRAMUSCULAR | Status: AC
  Filled 2023-09-25: qty 10

## 2023-09-25 MED ORDER — ONDANSETRON HCL 4 MG/2ML IJ SOLN
INTRAMUSCULAR | Status: DC | PRN
  Administered 2023-09-25: 4 mg via INTRAVENOUS

## 2023-09-25 MED ORDER — SODIUM CHLORIDE 0.9% FLUSH: 3.0000 mL | Freq: Two times a day (BID) | INTRAVENOUS | Status: DC

## 2023-09-25 MED ORDER — HEPARIN SODIUM (PORCINE) 1000 UNIT/ML IJ SOLN
INTRAMUSCULAR | Status: DC | PRN
  Administered 2023-09-25: 14000 [IU] via INTRAVENOUS

## 2023-09-25 MED ORDER — ROCURONIUM BROMIDE 10 MG/ML (PF) SYRINGE
PREFILLED_SYRINGE | INTRAVENOUS | Status: DC | PRN
  Administered 2023-09-25: 35 mg via INTRAVENOUS
  Administered 2023-09-25: 5 mg via INTRAVENOUS

## 2023-09-25 MED ORDER — PROTAMINE SULFATE 10 MG/ML IV SOLN
INTRAVENOUS | Status: DC | PRN
  Administered 2023-09-25: 40 mg via INTRAVENOUS

## 2023-09-25 MED ORDER — ONDANSETRON HCL 4 MG/2ML IJ SOLN
4.0000 mg | Freq: Four times a day (QID) | INTRAMUSCULAR | Status: DC | PRN
Start: 2023-09-25 — End: 2023-09-25

## 2023-09-25 MED ORDER — EPHEDRINE SULFATE-NACL 50-0.9 MG/10ML-% IV SOSY
PREFILLED_SYRINGE | INTRAVENOUS | Status: DC | PRN
  Administered 2023-09-25: 7.5 mg via INTRAVENOUS

## 2023-09-25 MED ORDER — FENTANYL CITRATE (PF) 100 MCG/2ML IJ SOLN
INTRAMUSCULAR | Status: AC
  Filled 2023-09-25: qty 2

## 2023-09-25 MED ORDER — DEXAMETHASONE SODIUM PHOSPHATE 10 MG/ML IJ SOLN
INTRAMUSCULAR | Status: DC | PRN
  Administered 2023-09-25: 4 mg via INTRAVENOUS

## 2023-09-25 MED ORDER — ACETAMINOPHEN 325 MG PO TABS: 650.0000 mg | ORAL_TABLET | ORAL | Status: DC | PRN

## 2023-09-25 MED ORDER — SODIUM CHLORIDE 0.9 % IV SOLN: INTRAVENOUS | Status: DC

## 2023-09-25 MED ORDER — SODIUM CHLORIDE 0.9 % IV SOLN: 250.0000 mL | INTRAVENOUS | Status: DC | PRN

## 2023-09-25 MED ORDER — PHENYLEPHRINE 80 MCG/ML (10ML) SYRINGE FOR IV PUSH (FOR BLOOD PRESSURE SUPPORT)
PREFILLED_SYRINGE | INTRAVENOUS | Status: DC | PRN
  Administered 2023-09-25 (×2): 160 ug via INTRAVENOUS

## 2023-09-25 MED ORDER — SODIUM CHLORIDE 0.9% FLUSH: 3.0000 mL | INTRAVENOUS | Status: DC | PRN

## 2023-09-25 MED ORDER — SUGAMMADEX SODIUM 200 MG/2ML IV SOLN
INTRAVENOUS | Status: DC | PRN
  Administered 2023-09-25: 200 mg via INTRAVENOUS

## 2023-09-25 MED ORDER — PHENYLEPHRINE HCL-NACL 20-0.9 MG/250ML-% IV SOLN
INTRAVENOUS | Status: DC | PRN
  Administered 2023-09-25: 40 ug/min via INTRAVENOUS

## 2023-09-25 MED ORDER — HEPARIN SODIUM (PORCINE) 1000 UNIT/ML IJ SOLN
INTRAMUSCULAR | Status: AC
  Filled 2023-09-25: qty 1

## 2023-09-25 MED ORDER — FENTANYL CITRATE (PF) 250 MCG/5ML IJ SOLN
INTRAMUSCULAR | Status: DC | PRN
  Administered 2023-09-25: 100 ug via INTRAVENOUS

## 2023-09-25 MED ORDER — SUCCINYLCHOLINE CHLORIDE 200 MG/10ML IV SOSY
PREFILLED_SYRINGE | INTRAVENOUS | Status: DC | PRN
  Administered 2023-09-25: 120 mg via INTRAVENOUS

## 2023-09-25 MED ORDER — DIPHENHYDRAMINE HCL 50 MG/ML IJ SOLN
INTRAMUSCULAR | Status: AC
  Filled 2023-09-25: qty 1

## 2023-09-25 MED ORDER — PROPOFOL 10 MG/ML IV BOLUS
INTRAVENOUS | Status: DC | PRN
  Administered 2023-09-25: 120 mg via INTRAVENOUS
  Administered 2023-09-25: 40 mg via INTRAVENOUS

## 2023-09-25 SURGICAL SUPPLY — 22 items
BAG SNAP BAND KOVER 36X36 (MISCELLANEOUS) IMPLANT
BLANKET WARM UNDERBOD FULL ACC (MISCELLANEOUS) ×2 IMPLANT
CABLE PFA RX CATH CONN (CABLE) IMPLANT
CATH 8FR REPROCESSED SOUNDSTAR (CATHETERS) ×1 IMPLANT
CATH 8FR SOUNDSTAR REPROCESSED (CATHETERS) IMPLANT
CATH FARAWAVE ABLATION 31 (CATHETERS) IMPLANT
CATH OCTARAY 2.0 F 3-3-3-3-3 (CATHETERS) IMPLANT
CATH WEB BI DIR CSDF CRV REPRO (CATHETERS) IMPLANT
CLOSURE MYNX CONTROL 6F/7F (Vascular Products) IMPLANT
CLOSURE PERCLOSE PROSTYLE (VASCULAR PRODUCTS) IMPLANT
COVER SWIFTLINK CONNECTOR (BAG) ×2 IMPLANT
DILATOR VESSEL 38 20CM 16FR (INTRODUCER) IMPLANT
GUIDEWIRE INQWIRE 1.5J.035X260 (WIRE) IMPLANT
INQWIRE 1.5J .035X260CM (WIRE) ×1 IMPLANT
KIT VERSACROSS CNCT FARADRIVE (KITS) IMPLANT
PACK EP LF (CUSTOM PROCEDURE TRAY) ×2 IMPLANT
PAD DEFIB RADIO PHYSIO CONN (PAD) ×2 IMPLANT
PATCH CARTO3 (PAD) IMPLANT
SHEATH FARADRIVE STEERABLE (SHEATH) IMPLANT
SHEATH PINNACLE 8F 10CM (SHEATH) IMPLANT
SHEATH PINNACLE 9F 10CM (SHEATH) IMPLANT
SHEATH PROBE COVER 6X72 (BAG) IMPLANT

## 2023-09-25 NOTE — Anesthesia Preprocedure Evaluation (Addendum)
Anesthesia Evaluation  Patient identified by MRN, date of birth, ID band Patient awake    Reviewed: Allergy & Precautions, H&P , NPO status , Patient's Chart, lab work & pertinent test results  Airway Mallampati: II   Neck ROM: Full    Dental no notable dental hx. (+) Teeth Intact, Dental Advisory Given   Pulmonary former smoker   Pulmonary exam normal breath sounds clear to auscultation       Cardiovascular hypertension, Pt. on medications + Peripheral Vascular Disease  + dysrhythmias Atrial Fibrillation  Rhythm:Irregular Rate:Normal     Neuro/Psych negative neurological ROS  negative psych ROS   GI/Hepatic Neg liver ROS,GERD  ,,  Endo/Other  negative endocrine ROS    Renal/GU negative Renal ROS  negative genitourinary   Musculoskeletal   Abdominal   Peds  Hematology negative hematology ROS (+)   Anesthesia Other Findings   Reproductive/Obstetrics negative OB ROS                             Anesthesia Physical Anesthesia Plan  ASA: 3  Anesthesia Plan: General   Post-op Pain Management: Tylenol PO (pre-op)*   Induction: Intravenous  PONV Risk Score and Plan: 3 and Ondansetron, Dexamethasone and Treatment may vary due to age or medical condition  Airway Management Planned: Oral ETT  Additional Equipment:   Intra-op Plan:   Post-operative Plan: Extubation in OR  Informed Consent: I have reviewed the patients History and Physical, chart, labs and discussed the procedure including the risks, benefits and alternatives for the proposed anesthesia with the patient or authorized representative who has indicated his/her understanding and acceptance.     Dental advisory given  Plan Discussed with: CRNA  Anesthesia Plan Comments:        Anesthesia Quick Evaluation

## 2023-09-25 NOTE — Interval H&P Note (Signed)
History and Physical Interval Note:  09/25/2023 9:08 AM  Tommy [name]  has presented today for surgery, with the diagnosis of afib.  The various methods of treatment have been discussed with the patient and family. After consideration of risks, benefits and other options for treatment, the patient has consented to  Procedure(s): ATRIAL FIBRILLATION ABLATION (N/A) as a surgical intervention.  The patient's history has been reviewed, patient examined, no change in status, stable for surgery.  I have reviewed the patient's chart and labs.  Questions were answered to the patient's satisfaction.     Jsoeph Podesta [employer]

## 2023-09-25 NOTE — Transfer of Care (Signed)
Immediate Anesthesia Transfer of Care Note  Patient: Tommy [name]  Procedure(s) Performed: ATRIAL FIBRILLATION ABLATION  Patient Location: Cath Lab  Anesthesia Type:General  Level of Consciousness: awake, alert , and patient cooperative  Airway & Oxygen Therapy: Patient Spontanous Breathing and Patient connected to face mask oxygen  Post-op Assessment: Report given to RN and Post -op Vital signs reviewed and stable  Post vital signs: Reviewed and stable  Last Vitals:  Vitals Value Taken Time  BP 139/75 09/25/23 1145  Temp 36.7 C 09/25/23 1143  Pulse 79 09/25/23 1150  Resp 17 09/25/23 1150  SpO2 94 % 09/25/23 1150  Vitals shown include unfiled device data.  Last Pain:  Vitals:   09/25/23 1143  TempSrc: Temporal  PainSc: 0-No pain         Complications: There were no known notable events for this encounter.

## 2023-09-25 NOTE — Discharge Instructions (Signed)
Cardiac Ablation, Care After  This sheet gives you information about how to care for yourself after your procedure. Your health care provider may also give you more specific instructions. If you have problems or questions, contact your health care provider. What can I expect after the procedure? After the procedure, it is common to have: Bruising around your puncture site. Tenderness around your puncture site. Skipped heartbeats. If you had an atrial fibrillation ablation, you may have atrial fibrillation during the first several months after your procedure.  Tiredness (fatigue).  Follow these instructions at home: Puncture site care  Follow instructions from your health care provider about how to take care of your puncture site. Make sure you: Remove large square bandage present, tomorrow at 12 noon. Check your puncture site every day for signs of infection. Check for: Redness, swelling, or pain. Fluid or blood. If your puncture site starts to bleed, lie down on your back, apply firm pressure to the area, and contact your health care provider. Warmth. Pus or a bad smell. A pea or small marble sized lump at the site is normal and can take up to three months to resolve.  Driving Do not drive for at least 4 days after your procedure or however long your health care provider recommends. (Do not resume driving if you have previously been instructed not to drive for other health reasons.) Do not drive or use heavy machinery while taking prescription pain medicine. Activity Avoid activities that take a lot of effort for at least 7 days after your procedure. Do not lift anything that is heavier than 5 lb (4.5 kg) for one week.  No sexual activity for 1 week.  Return to your normal activities as told by your health care provider. Ask your health care provider what activities are safe for you. General instructions Take over-the-counter and prescription medicines only as told by your health care  provider. Do not use any products that contain nicotine or tobacco, such as cigarettes and e-cigarettes. If you need help quitting, ask your health care provider. You may shower after after removal of dressing, but Do not take baths, swim, or use a hot tub for 1 week.  Do not drink alcohol for 24 hours after your procedure. Keep all follow-up visits as told by your health care provider. This is important. Contact a health care provider if: You have redness, mild swelling, or pain around your puncture site. You have fluid or blood coming from your puncture site that stops after applying firm pressure to the area for 15 minutes. Your puncture site feels warm to the touch. You have pus or a bad smell coming from your puncture site. You have a fever. You have chest pain or discomfort that spreads to your neck, jaw, or arm. You have chest pain that is worse with lying on your back or taking a deep breath. You are sweating a lot. You feel nauseous. You have a fast or irregular heartbeat. You have shortness of breath. You are dizzy or light-headed and feel the need to lie down. You have pain or numbness in the arm or leg closest to your puncture site. Get help right away if: Your puncture site suddenly swells. Apply firm steady pressure and CALL 911 Your puncture site is bleeding and the bleeding does not stop after applying firm pressure to the area. These symptoms may represent a serious problem that is an emergency. Do not wait to see if the symptoms will go away. Get medical  help right away. Call your local emergency services (911 in the [place] Do not drive yourself to the hospital. Summary After the procedure, it is normal to have bruising and tenderness at the puncture site in your groin, neck, or forearm. Check your puncture site every day for signs of infection. Get help right away if your puncture site is bleeding and the bleeding does not stop after applying firm pressure to the area.  This is a medical emergency. This information is not intended to replace advice given to you by your health care provider. Make sure you discuss any questions you have with your health care provider.

## 2023-09-25 NOTE — Anesthesia Postprocedure Evaluation (Signed)
Anesthesia Post Note  Patient: Tommy [name]  Procedure(s) Performed: ATRIAL FIBRILLATION ABLATION     Patient location during evaluation: Cath Lab Anesthesia Type: General Level of consciousness: awake and alert Pain management: pain level controlled Vital Signs Assessment: post-procedure vital signs reviewed and stable Respiratory status: spontaneous breathing, nonlabored ventilation and respiratory function stable Cardiovascular status: blood pressure returned to baseline and stable Postop Assessment: no apparent nausea or vomiting Anesthetic complications: no  There were no known notable events for this encounter.  Last Vitals:  Vitals:   09/25/23 1215 09/25/23 1217  BP: 119/70   Pulse: 69   Resp: 15   Temp:  36.6 C  SpO2: 95%     Last Pain:  Vitals:   09/25/23 1217  TempSrc: Temporal  PainSc:                  Trevaughn Schear,W. EDMOND

## 2023-09-25 NOTE — Addendum Note (Signed)
Addendum  created 09/25/23 1357 by Darlina Guys, CRNA   Flowsheet accepted

## 2023-09-25 NOTE — Anesthesia Procedure Notes (Signed)
Procedure Name: Intubation Date/Time: 09/25/2023 9:57 AM  Performed by: Darlina Guys, CRNAPre-anesthesia Checklist: Patient identified, Emergency Drugs available, Suction available and Patient being monitored Patient Re-evaluated:Patient Re-evaluated prior to induction Oxygen Delivery Method: Circle System Utilized Preoxygenation: Pre-oxygenation with 100% oxygen Induction Type: IV induction Ventilation: Oral airway inserted - appropriate to patient size Laryngoscope Size: 4 and Mac Grade View: Grade III Tube type: Oral Number of attempts: 2 Airway Equipment and Method: Stylet and Oral airway Placement Confirmation: ETT inserted through vocal cords under direct vision, positive ETCO2 and breath sounds checked- equal and bilateral Secured at: 23 cm Tube secured with: Tape Dental Injury: Teeth and Oropharynx as per pre-operative assessment  Comments: Able to mask with size 10 cm OPA. DL x 1 by CRNA (Mac 4) with grade 3 view. Mac blade removed and quickly switched to G/S 4 blade (gopro). No desaturations. Atraumatic intubation. Dentition and oral mucosa as per preop.

## 2023-09-26 ENCOUNTER — Encounter (HOSPITAL_COMMUNITY): Payer: Self-pay | Admitting: Cardiology

## 2023-09-26 MED FILL — Diphenhydramine HCl Inj 50 MG/ML: INTRAMUSCULAR | Qty: 1 | Status: AC

## 2023-09-26 MED FILL — Atropine Sulfate Soln Prefill Syr 1 MG/10ML (0.1 MG/ML): INTRAMUSCULAR | Qty: 10 | Status: AC

## 2023-09-26 MED FILL — Fentanyl Citrate Soln Prefilled Syringe 100 MCG/2ML: INTRAMUSCULAR | Qty: 2 | Status: AC

## 2023-09-27 ENCOUNTER — Other Ambulatory Visit: Payer: Self-pay | Admitting: *Deleted

## 2023-09-27 MED ORDER — APIXABAN 5 MG PO TABS: 5.0000 mg | ORAL_TABLET | Freq: Two times a day (BID) | ORAL | 1 refills | Status: DC

## 2023-09-28 ENCOUNTER — Other Ambulatory Visit: Payer: Self-pay | Admitting: Family Medicine

## 2023-09-28 DIAGNOSIS — E785 Hyperlipidemia, unspecified: Secondary | ICD-10-CM

## 2023-10-23 ENCOUNTER — Ambulatory Visit (HOSPITAL_COMMUNITY)
Admission: RE | Admit: 2023-10-23 | Discharge: 2023-10-23 | Disposition: A | Discharge status: Home / Self Care | Payer: Medicare HMO | Source: Ambulatory Visit | Attending: Internal Medicine | Admitting: Internal Medicine

## 2023-10-23 VITALS — BP 132/66 | HR 59 | Ht 71.0 in | Wt 188.0 lb

## 2023-10-23 DIAGNOSIS — E785 Hyperlipidemia, unspecified: Secondary | ICD-10-CM | POA: Insufficient documentation

## 2023-10-23 DIAGNOSIS — Z7901 Long term (current) use of anticoagulants: Secondary | ICD-10-CM | POA: Insufficient documentation

## 2023-10-23 DIAGNOSIS — I739 Peripheral vascular disease, unspecified: Secondary | ICD-10-CM | POA: Insufficient documentation

## 2023-10-23 DIAGNOSIS — Z5181 Encounter for therapeutic drug level monitoring: Secondary | ICD-10-CM | POA: Diagnosis not present

## 2023-10-23 DIAGNOSIS — D6869 Other thrombophilia: Secondary | ICD-10-CM | POA: Insufficient documentation

## 2023-10-23 DIAGNOSIS — I10 Essential (primary) hypertension: Secondary | ICD-10-CM | POA: Diagnosis not present

## 2023-10-23 DIAGNOSIS — I480 Paroxysmal atrial fibrillation: Secondary | ICD-10-CM | POA: Insufficient documentation

## 2023-10-23 DIAGNOSIS — R7303 Prediabetes: Secondary | ICD-10-CM | POA: Diagnosis not present

## 2023-10-23 DIAGNOSIS — Z79899 Other long term (current) drug therapy: Secondary | ICD-10-CM | POA: Diagnosis not present

## 2023-10-23 NOTE — Progress Notes (Signed)
 Primary Care Physician: [name], Fonda LABOR, MD Primary Cardiologist: none Primary Electrophysiologist: none Referring Physician: Dr [name]   Tommy [name] Tommy [name] is a 73 y.o. male with a history of HTN, HLD, prediabetes, PAD s/p SFA stent 2009, atrial fibrillation who presents for follow up in the [facility].  The patient was initially diagnosed with atrial fibrillation on a Zio monitor after presenting to his PCP with symptoms of dizziness and palpitations. Zio monitor showed 3% afib burden with 12 pauses, longest 4.5 seconds. He was started on metoprolol  and referred to the AF clinic. Patient has a CHADS2VASC score of 3-4. Started on Eliquis  09/14/22.  On follow up today, patient reports that he has done well since his last visit. He has not had any further afib episodes detected on his smart watch. No bleeding issues on anticoagulation.   On follow up 10/23/23, patient is currently in NSR. S/p Afib ablation on 09/25/23 by Dr. Inocencio. He is currently on amiodarone  200 mg daily. He notices intermittent hand shaking. No episodes of Afib since ablation. No chest pain or SOB. Leg sites healed without issue. No missed doses of Eliquis  5 mg BID.   Today, he denies symptoms of orthopnea, PND, lower extremity edema, dizziness, presyncope, syncope, snoring, daytime somnolence, bleeding, or neurologic sequela. The patient is tolerating medications without difficulties and is otherwise without complaint today.    Atrial Fibrillation Risk Factors:  he does not have symptoms or diagnosis of sleep apnea. he does not have a history of rheumatic fever. he does not have a history of alcohol use. The patient does not have a history of early familial atrial fibrillation or other arrhythmias.  he has a BMI of Body mass index is 26.22 kg/m.SABRA Filed Weights   10/23/23 1015  Weight: 85.3 kg     Family History  Problem Relation Age of Onset   Heart disease Mother    Cancer  Mother    Deep vein thrombosis Mother    Diabetes Mother    Hyperlipidemia Mother    Hypertension Mother    Heart attack Mother    Peripheral vascular disease Mother    Cancer Father    Colon cancer Neg Hx      Atrial Fibrillation Management history:  Previous antiarrhythmic drugs: none Previous cardioversions: none Previous ablations: 09/25/23 CHADS2VASC score: 3-4 Anticoagulation history: Eliquis    Past Medical History:  Diagnosis Date   BPH with obstruction/lower urinary tract symptoms    hyperplasia   ED (erectile dysfunction)    arterial insuff.   History of DVT of lower extremity    History of gastric ulcer    Hyperlipidemia    Hypertension    Peripheral vascular disease (HCC)    Prostate cancer (HCC) DX 01/01/15--  urologist-  dr watt   Stage T1c, Gleason 3+4, PSA 5.73, vol 31ml   Wears glasses    Past Surgical History:  Procedure Laterality Date   ATRIAL FIBRILLATION ABLATION N/A 09/25/2023   Procedure: ATRIAL FIBRILLATION ABLATION;  Surgeon: Inocencio Soyla Lunger, MD;  Location: [facility];  Service: Cardiovascular;  Laterality: N/A;   FEMORAL ARTERY STENT Left 07-22-2008  &  11-16-2010   left SFA//   In-Stent restenosis   ORIF LEFT INDEX FINGER FX  09-28-2006   PROSTATE BIOPSY  01/01/15   PROSTATE SURGERY  July 2016   Seed Implant   RADIOACTIVE SEED IMPLANT N/A 04/23/2015   Procedure: RADIOACTIVE SEED IMPLANT/BRACHYTHERAPY IMPLANT;  Surgeon: Norleen Watt, MD;  Location: [facility];  Service: Urology;  Laterality: N/A;   TONSILLECTOMY  as child    Current Outpatient Medications  Medication Sig Dispense Refill   amiodarone  (PACERONE ) 200 MG tablet Take 2 tablets (400 mg total) by mouth 2 (two) times daily for 13 days, THEN 1 tablet (200 mg total) 2 (two) times daily for 14 days, THEN 1 tablet (200 mg total) daily. (Patient taking differently: 1 tablet (200 mg total) daily.) 143 tablet 0   amLODipine  (NORVASC ) 10 MG tablet Take 1 tablet  (10 mg total) by mouth daily. 90 tablet 3   apixaban  (ELIQUIS ) 5 MG TABS tablet Take 1 tablet (5 mg total) by mouth 2 (two) times daily. 180 tablet 1   aspirin  EC 81 MG tablet Take 81 mg by mouth daily. Swallow whole.     chlorthalidone  (HYGROTON ) 25 MG tablet Take 1 tablet (25 mg total) by mouth daily. 90 tablet 1   fenofibrate  (TRICOR ) 145 MG tablet Take 1 tablet (145 mg total) by mouth daily. 90 tablet 3   losartan  (COZAAR ) 50 MG tablet Take 1 tablet (50 mg total) by mouth daily. 90 tablet 1   rosuvastatin  (CRESTOR ) 10 MG tablet TAKE 1 TABLET EVERY EVENING 90 tablet 1   tamsulosin  (FLOMAX ) 0.4 MG CAPS capsule Take 1 capsule (0.4 mg total) by mouth daily after supper. 90 capsule 3   No current facility-administered medications for this encounter.    Allergies  Allergen Reactions   Penicillins Swelling   ROS- All systems are reviewed and negative except as per the HPI above.  Physical Exam: Vitals:   10/23/23 1015  BP: 132/66  Pulse: (!) 59  Weight: 85.3 kg  Height: 5' 11 (1.803 m)    GEN- The patient is well appearing, alert and oriented x 3 today.   Neck - no JVD or carotid bruit noted Lungs- Clear to ausculation bilaterally, normal work of breathing Heart- Regular rate and rhythm, no murmurs, rubs or gallops, PMI not laterally displaced Extremities- no clubbing, cyanosis, or edema Skin - no rash or ecchymosis noted   Wt Readings from Last 3 Encounters:  10/23/23 85.3 kg  09/25/23 86.2 kg  09/21/23 86.6 kg    EKG today demonstrates  Vent. rate 59 BPM PR interval 188 ms QRS duration 80 ms QT/QTcB 400/396 ms P-R-T axes 15 58 -28 Sinus bradycardia ST & T wave abnormality, consider inferior ischemia Abnormal ECG When compared with ECG of 25-Sep-2023 11:51, PREVIOUS ECG IS PRESENT  Echo 10/11/22  1. Left ventricular ejection fraction, by estimation, is 65 to 70%. The  left ventricle has normal function. The left ventricle has no regional  wall motion  abnormalities. There is severe concentric left ventricular  hypertrophy. Left ventricular diastolic parameters are consistent with Grade I diastolic dysfunction (impaired relaxation).   2. Right ventricular systolic function is hyperdynamic. The right  ventricular size is normal. Tricuspid regurgitation signal is inadequate  for assessing PA pressure.   3. Left atrial size was mildly dilated.   4. The mitral valve is normal in structure. No evidence of mitral valve  regurgitation. No evidence of mitral stenosis. Moderate mitral annular  calcification.   5. The aortic valve is tricuspid. There is mild calcification of the  aortic valve. Aortic valve regurgitation is not visualized. Aortic valve  sclerosis/calcification is present, without any evidence of aortic  stenosis.    Epic records are reviewed at length today  CHA2DS2-VASc Score = 3  The patient's score is based  upon: CHF History: 0 HTN History: 1 Diabetes History: 0 Stroke History: 0 Vascular Disease History: 1 Age Score: 1 Gender Score: 0       ASSESSMENT AND PLAN: 1. Paroxysmal Atrial Fibrillation (ICD10:  I48.0) The patient's CHA2DS2-VASc score is 3, indicating a 3.2% annual risk of stroke.   Continue Eliquis  5 mg BID and ASA per vascular team. S/p Afib ablation on 09/25/23 by Dr. Inocencio.  Patient appears to be maintaining SR. Qtc stable. Continue amiodarone  200 mg daily.  Would avoid class IC with LVH.  2. Secondary Hypercoagulable State (ICD10:  D68.69) The patient is at significant risk for stroke/thromboembolism based upon his CHA2DS2-VASc Score of 3.  Start Apixaban  (Eliquis ).   3. HTN Stable, no changes today.  4. PAD S/p left SFA in 2009 Followed by [facility]   Follow up as scheduled with EP.   Dorn Heinrich, PA-C Afib Clinic [facility] [address] [place], [place] [address] [phone] 10/23/2023 10:47 AM

## 2023-11-24 ENCOUNTER — Other Ambulatory Visit (HOSPITAL_COMMUNITY): Payer: Self-pay

## 2023-12-27 NOTE — Progress Notes (Unsigned)
 Cardiology Office Note:  .   Date:  12/27/2023  ID:  Tommy [name], DOB 09-Jul-1951, MRN [id number] PCP: [name], Tommy Radon, MD  [facility] Providers Cardiologist:  Rollene Rotunda, MD { EP: Dr. Elberta Fortis   History of Present Illness: .   Tommy [name] is a 73 y.o. male w/PMHx of  HTN, PAD s/p SFA stenting 2009, DVT  AFib  2023: Monitor showed 3% AF burden with average rate of 124bpm, 12 pauses with longest lasting 4.5 seconds.  >>> After his monitor, he was started on Toprol XL and eliquis.   ECHO 10/2022 LVEF 65-70%, no RWMA, severe concentric LVH, G1DD, RV systolic function is hyperdynamic, LA mildly dilated. Cardiac MRI 04/2023 showed no evidence of cardiac amyloid, LVEF 67%, no LGE.    Admitted Oct 2024 with near syncope, EP brought on board with post conversion pauses (reported as long as 8 seconds while at [facility]) noted on tele that were symptomatic Planned for amiodarone as a bridge to ablation  Saw Tommy [name] 09/13/23, a bit of tremor in his hands since on amio, slight dizzy spells, was in AFib 112bpm  AFib ablation 09/25/23  Saw the Afib clinic 1/113/25, no AFib symptoms, again mentioned hand tremor  Today's visit is scheduled as his 3 mo post ablation visit  ROS:   He feels well Denies any CP, palpitations or cardiac awareness Never has had palpitations Presented after a syncopal event when Af was found >> none since then No weak/dizzy spells, no near syncope No SOB  Works part tim in a store, Conservation officer, nature mostly, walks his dog, gets out and works in the yard Reports good exertional capacity No bleeding or signs of bleeding  Healed well, no procedural site concerns   Arrhythmia/AAD hx Amiodarone started Oct 2024 AFib ablation 09/25/23 (PFA)  Studies Reviewed: Marland Kitchen    EKG done today and reviewed by myself:  SB 58bpm  09/25/23: EPS/ablation CONCLUSIONS: 1. Sinus rhythm upon presentation.   2. Successful electrical isolation and anatomical encircling of all four  pulmonary veins with pulse field ablation. 3. Ablation of posterior wall with pulse field ablation 4. No early apparent complications.   Cardiac CT 09/15/23 IMPRESSION: Diffuse mucosal thickening of the esophagus consistent with a mucosal process or esophagitis. IMPRESSION: 1. There is normal pulmonary vein drainage into the left atrium. 2. The left atrial appendage is patent. 3. The esophagus runs in the left atrial midline but is closest to the left inferior pulmonary vein. Aortic Valve: Tri-leaflet. Calcium score 109.    Risk Assessment/Calculations:    Physical Exam:   VS:  There were no vitals taken for this visit.   Wt Readings from Last 3 Encounters:  10/23/23 188 lb (85.3 kg)  09/25/23 190 lb (86.2 kg)  09/21/23 191 lb (86.6 kg)    GEN: Well nourished, well developed in no acute distress NECK: No JVD; No carotid bruits CARDIAC: RRR, no murmurs, rubs, gallops RESPIRATORY:  CTA b/l without rales, wheezing or rhonchi  ABDOMEN: Soft, non-tender, non-distended EXTREMITIES: No edema; R hand partial amp (traumatic remotely) 3rd/4th fingers  ASSESSMENT AND PLAN: .    paroxysmal AFib Post conversion pauses CHA2DS2Vasc is 5, on Eliquis, appropriately dosed Low/no burden based on symptoms, uncertain he would know if he was in Afib but does have an apple watch with no alerts  Reports a hand tremor occasionally is quite severe Will stop amiodarone Advised to d/w his PMD though as well   HTN Looks OK LVH No SOB, DOE  Deferred to Dr. Ernestina Columbia  Secondary hypercoagulable state 2/2 AFib     Dispo: back in 3-4 months off amiodarone  Signed, Delorus Langwell Norberto Sorenson, PA-C

## 2023-12-28 ENCOUNTER — Ambulatory Visit: Payer: Medicare HMO | Attending: Physician Assistant | Admitting: Physician Assistant

## 2023-12-28 VITALS — BP 112/66 | HR 68 | Ht 71.0 in | Wt 183.2 lb

## 2023-12-28 DIAGNOSIS — I517 Cardiomegaly: Secondary | ICD-10-CM

## 2023-12-28 DIAGNOSIS — I480 Paroxysmal atrial fibrillation: Secondary | ICD-10-CM | POA: Diagnosis not present

## 2023-12-28 DIAGNOSIS — D6869 Other thrombophilia: Secondary | ICD-10-CM | POA: Diagnosis not present

## 2023-12-28 DIAGNOSIS — I4819 Other persistent atrial fibrillation: Secondary | ICD-10-CM | POA: Diagnosis not present

## 2023-12-28 DIAGNOSIS — I10 Essential (primary) hypertension: Secondary | ICD-10-CM | POA: Diagnosis not present

## 2023-12-28 NOTE — Patient Instructions (Signed)
  Medication Instructions:   STOP TAKING : AMIODARONE   *If you need a refill on your cardiac medications before your next appointment, please call your pharmacy*   Lab Work:  PLEASE GO DOWN STAIRS  [facility]  FIRST FLOOR  [address] ( GET OFF ELEVATORS MAKE A LEFT AND ANOTHER LEFT LAB ON RIGHT DOWN HALLWAY :  BMET TODAY     If you have labs (blood work) drawn today and your tests are completely normal, you will receive your results only by: MyChart Message (if you have MyChart) OR A paper copy in the mail If you have any lab test that is abnormal or we need to change your treatment, we will call you to review the results.   Testing/Procedures: NONE ORDERED  TODAY      Follow-Up: At [facility], you and your health needs are our priority.  As part of our continuing mission to provide you with exceptional heart care, we have created designated Provider Care Teams.  These Care Teams include your primary Cardiologist (physician) and Advanced Practice Providers (APPs -  Physician Assistants and Nurse Practitioners) who all work together to provide you with the care you need, when you need it.  We recommend signing up for the patient portal called "MyChart".  Sign up information is provided on this After Visit Summary.  MyChart is used to connect with patients for Virtual Visits (Telemedicine).  Patients are able to view lab/test results, encounter notes, upcoming appointments, etc.  Non-urgent messages can be sent to your provider as well.   To learn more about what you can do with MyChart, go to [email or web address].    Your next appointment:    3 -4  month(s) ( CONTACT  CASSIE HALL/ ANGELINE HAMMER FOR EP SCHEDULING ISSUES )    Provider:    Francis Dowse, PA-C     Other Instructions       1st Floor: - Lobby - Registration  - Pharmacy  - Lab - Cafe  2nd Floor: - PV Lab - Diagnostic Testing (echo, CT, nuclear med)  3rd Floor: - Vacant  4th Floor: - TCTS  (cardiothoracic surgery) - AFib Clinic - Structural Heart Clinic - Vascular Surgery  - Vascular Ultrasound  5th Floor: - HeartCare Cardiology (general and EP) - Clinical Pharmacy for coumadin, hypertension, lipid, weight-loss medications, and med management appointments    Valet parking services will be available as well.

## 2023-12-29 ENCOUNTER — Other Ambulatory Visit: Payer: Self-pay | Admitting: *Deleted

## 2023-12-29 DIAGNOSIS — I480 Paroxysmal atrial fibrillation: Secondary | ICD-10-CM

## 2023-12-29 LAB — BASIC METABOLIC PANEL
BUN/Creatinine Ratio: 14 (ref 10–24)
BUN: 22 mg/dL (ref 8–27)
CO2: 25 mmol/L (ref 20–29)
Calcium: 10.3 mg/dL — ABNORMAL HIGH (ref 8.6–10.2)
Chloride: 103 mmol/L (ref 96–106)
Creatinine, Ser: 1.52 mg/dL — ABNORMAL HIGH (ref 0.76–1.27)
Glucose: 157 mg/dL — ABNORMAL HIGH (ref 70–99)
Potassium: 4.2 mmol/L (ref 3.5–5.2)
Sodium: 145 mmol/L — ABNORMAL HIGH (ref 134–144)
eGFR: 48 mL/min/{1.73_m2} — ABNORMAL LOW (ref >59)

## 2023-12-29 MED ORDER — LOSARTAN POTASSIUM 25 MG PO TABS
25.0000 mg | ORAL_TABLET | Freq: Every day | ORAL | 2 refills | Status: DC
Start: 2023-12-29 — End: 2024-08-20

## 2024-01-16 ENCOUNTER — Other Ambulatory Visit: Payer: Self-pay | Admitting: Family Medicine

## 2024-01-16 DIAGNOSIS — I10 Essential (primary) hypertension: Secondary | ICD-10-CM

## 2024-01-16 DIAGNOSIS — E785 Hyperlipidemia, unspecified: Secondary | ICD-10-CM

## 2024-02-07 DIAGNOSIS — H52223 Regular astigmatism, bilateral: Secondary | ICD-10-CM | POA: Diagnosis not present

## 2024-02-07 DIAGNOSIS — H524 Presbyopia: Secondary | ICD-10-CM | POA: Diagnosis not present

## 2024-02-07 DIAGNOSIS — D3131 Benign neoplasm of right choroid: Secondary | ICD-10-CM | POA: Diagnosis not present

## 2024-02-07 DIAGNOSIS — D3132 Benign neoplasm of left choroid: Secondary | ICD-10-CM | POA: Diagnosis not present

## 2024-02-07 DIAGNOSIS — H35033 Hypertensive retinopathy, bilateral: Secondary | ICD-10-CM | POA: Diagnosis not present

## 2024-02-07 DIAGNOSIS — H2513 Age-related nuclear cataract, bilateral: Secondary | ICD-10-CM | POA: Diagnosis not present

## 2024-02-07 DIAGNOSIS — H5203 Hypermetropia, bilateral: Secondary | ICD-10-CM | POA: Diagnosis not present

## 2024-02-14 DIAGNOSIS — C44229 Squamous cell carcinoma of skin of left ear and external auricular canal: Secondary | ICD-10-CM | POA: Diagnosis not present

## 2024-02-21 ENCOUNTER — Other Ambulatory Visit: Payer: Self-pay | Admitting: Family Medicine

## 2024-02-21 DIAGNOSIS — E785 Hyperlipidemia, unspecified: Secondary | ICD-10-CM

## 2024-03-10 ENCOUNTER — Encounter: Payer: Self-pay | Admitting: Cardiology

## 2024-03-11 ENCOUNTER — Telehealth: Payer: Self-pay | Admitting: Cardiology

## 2024-03-11 ENCOUNTER — Telehealth: Payer: Self-pay | Admitting: Family Medicine

## 2024-03-11 ENCOUNTER — Other Ambulatory Visit: Payer: Self-pay

## 2024-03-11 DIAGNOSIS — I480 Paroxysmal atrial fibrillation: Secondary | ICD-10-CM

## 2024-03-11 NOTE — Telephone Encounter (Signed)
 Pt aware need orders before scheduling apt.  Copied from CRM [id number]. Topic: Appointments - Scheduling Inquiry for Clinic >> Mar 11, 2024 10:28 AM Crispin Dolphin wrote: Reason for CRM: Patient called states needs labs for Dr in [place]. Was told he could have them done at [facility]. Needs completed by 5th and doesn't drive. Other Doctor can fax order needs lipid and multi myeloma. Thank You

## 2024-03-11 NOTE — Telephone Encounter (Signed)
 Caller Belva Boyden) stated she will need a copy of patient's lab order which shows the bar code faxed to fax# [phone] so patient can have lab drawn at their location.

## 2024-03-11 NOTE — Telephone Encounter (Signed)
 Called to schedule lab apt. NA please schedule

## 2024-03-11 NOTE — Telephone Encounter (Signed)
 Lmtcb to schedule lab apt  Copied from CRM [id number]. Topic: General - Other >> Mar 11, 2024  1:10 PM Emylou G wrote: Reason for CRM: Patient called.. wants to know if heart doctor sent in lab request

## 2024-03-11 NOTE — Telephone Encounter (Signed)
 Lab order with barcode faxed.

## 2024-03-12 ENCOUNTER — Other Ambulatory Visit: Payer: Self-pay | Admitting: *Deleted

## 2024-03-12 DIAGNOSIS — I739 Peripheral vascular disease, unspecified: Secondary | ICD-10-CM

## 2024-03-12 DIAGNOSIS — I7143 Infrarenal abdominal aortic aneurysm, without rupture: Secondary | ICD-10-CM

## 2024-03-13 DIAGNOSIS — I480 Paroxysmal atrial fibrillation: Secondary | ICD-10-CM | POA: Diagnosis not present

## 2024-03-13 DIAGNOSIS — I517 Cardiomegaly: Secondary | ICD-10-CM | POA: Diagnosis not present

## 2024-03-13 DIAGNOSIS — E785 Hyperlipidemia, unspecified: Secondary | ICD-10-CM | POA: Diagnosis not present

## 2024-03-13 DIAGNOSIS — Z79899 Other long term (current) drug therapy: Secondary | ICD-10-CM | POA: Diagnosis not present

## 2024-03-13 LAB — BASIC METABOLIC PANEL WITH GFR
BUN/Creatinine Ratio: 14 (ref 10–24)
BUN: 22 mg/dL (ref 8–27)
CO2: 24 mmol/L (ref 20–29)
Calcium: 10.8 mg/dL — ABNORMAL HIGH (ref 8.6–10.2)
Chloride: 101 mmol/L (ref 96–106)
Creatinine, Ser: 1.62 mg/dL — ABNORMAL HIGH (ref 0.76–1.27)
Glucose: 116 mg/dL — ABNORMAL HIGH (ref 70–99)
Potassium: 4.1 mmol/L (ref 3.5–5.2)
Sodium: 141 mmol/L (ref 134–144)
eGFR: 44 mL/min/{1.73_m2} — ABNORMAL LOW (ref >59)

## 2024-03-14 ENCOUNTER — Ambulatory Visit: Payer: Self-pay | Admitting: Physician Assistant

## 2024-03-14 LAB — LIPID PANEL
Chol/HDL Ratio: 3.6 ratio (ref 0.0–5.0)
Cholesterol, Total: 142 mg/dL (ref 100–199)
HDL: 40 mg/dL (ref >39)
LDL Chol Calc (NIH): 84 mg/dL (ref 0–99)
Triglycerides: 96 mg/dL (ref 0–149)
VLDL Cholesterol Cal: 18 mg/dL (ref 5–40)

## 2024-03-15 ENCOUNTER — Other Ambulatory Visit: Payer: Self-pay | Admitting: *Deleted

## 2024-03-15 LAB — MULTIPLE MYELOMA PANEL, SERUM
Albumin SerPl Elph-Mcnc: 3.8 g/dL (ref 2.9–4.4)
Albumin/Glob SerPl: 1.5 (ref 0.7–1.7)
Alpha 1: 0.3 g/dL (ref 0.0–0.4)
Alpha2 Glob SerPl Elph-Mcnc: 0.6 g/dL (ref 0.4–1.0)
B-Globulin SerPl Elph-Mcnc: 0.9 g/dL (ref 0.7–1.3)
Gamma Glob SerPl Elph-Mcnc: 0.9 g/dL (ref 0.4–1.8)
Globulin, Total: 2.7 g/dL (ref 2.2–3.9)
IgA/Immunoglobulin A, Serum: 76 mg/dL (ref 61–437)
IgG (Immunoglobin G), Serum: 890 mg/dL (ref 603–1613)
IgM (Immunoglobulin M), Srm: 96 mg/dL (ref 15–143)
Total Protein: 6.5 g/dL (ref 6.0–8.5)

## 2024-03-15 MED ORDER — CHLORTHALIDONE 25 MG PO TABS: 12.5000 mg | ORAL_TABLET | Freq: Every day | ORAL | Status: DC

## 2024-03-17 ENCOUNTER — Ambulatory Visit: Payer: Self-pay | Admitting: Cardiology

## 2024-03-18 NOTE — Telephone Encounter (Signed)
 Pt returning call

## 2024-03-21 ENCOUNTER — Ambulatory Visit: Payer: Medicare HMO | Admitting: Family Medicine

## 2024-03-21 ENCOUNTER — Encounter: Payer: Self-pay | Admitting: Family Medicine

## 2024-03-21 VITALS — BP 116/67 | HR 69 | Temp 97.9°F | Ht 71.0 in | Wt 172.8 lb

## 2024-03-21 DIAGNOSIS — E785 Hyperlipidemia, unspecified: Secondary | ICD-10-CM | POA: Diagnosis not present

## 2024-03-21 DIAGNOSIS — I2510 Atherosclerotic heart disease of native coronary artery without angina pectoris: Secondary | ICD-10-CM

## 2024-03-21 DIAGNOSIS — I10 Essential (primary) hypertension: Secondary | ICD-10-CM

## 2024-03-21 DIAGNOSIS — I739 Peripheral vascular disease, unspecified: Secondary | ICD-10-CM

## 2024-03-21 DIAGNOSIS — I2583 Coronary atherosclerosis due to lipid rich plaque: Secondary | ICD-10-CM | POA: Diagnosis not present

## 2024-03-21 DIAGNOSIS — K219 Gastro-esophageal reflux disease without esophagitis: Secondary | ICD-10-CM | POA: Diagnosis not present

## 2024-03-21 DIAGNOSIS — N1832 Chronic kidney disease, stage 3b: Secondary | ICD-10-CM

## 2024-03-21 DIAGNOSIS — R7303 Prediabetes: Secondary | ICD-10-CM

## 2024-03-21 LAB — BAYER DCA HB A1C WAIVED: HB A1C (BAYER DCA - WAIVED): 5.1 % (ref 4.8–5.6)

## 2024-03-21 NOTE — Progress Notes (Signed)
 BP 116/67   Pulse 69   Temp 97.9 F (36.6 C) (Temporal)   Ht 5' 11 (1.803 m)   Wt 172 lb 12.8 oz (78.4 kg)   SpO2 96%   BMI 24.10 kg/m    Subjective:   Patient ID: Tommy [name], male    DOB: 01-01-1951, 73 y.o.   MRN: [id number]  HPI: Tommy [name] is a 73 y.o. male presenting on 03/21/2024 for Medical Management of Chronic Issues (6 month)   HPI Hypertension and A-fib recheck Patient is currently on amlodipine  and chlorthalidone  and losartan  and Eliquis , and their blood pressure today is 116/67. Patient denies any lightheadedness or dizziness. Patient denies headaches, blurred vision, chest pains, shortness of breath, or weakness. Denies any side effects from medication and is content with current medication.   Hyperlipidemia and CAD and PAD and PVD recheck Patient is coming in for recheck of his hyperlipidemia. The patient is currently taking aspirin  and Eliquis  and fenofibrate  and rosuvastatin . They deny any issues with myalgias or history of liver damage from it. They deny any focal numbness or weakness or chest pain.   GERD Patient is currently on no medication currently and denies symptoms.  She denies any major symptoms or abdominal pain or belching or burping. She denies any blood in her stool or lightheadedness or dizziness.   Prediabetes Patient comes in today for recheck of his diabetes. Patient has been currently taking no medication, diet control. Patient is currently on an ACE inhibitor/ARB. Patient has not seen an ophthalmologist this year. Patient denies any new issues with their feet. The symptom started onset as an adult hypertension and hyperlipidemia ARE RELATED TO DM   Relevant past medical, surgical, family and social history reviewed and updated as indicated. Interim medical history since our last visit reviewed. Allergies and medications reviewed and updated.  Review of Systems  Constitutional:  Negative for chills and fever.  Eyes:  Negative for visual  disturbance.  Respiratory:  Negative for shortness of breath and wheezing.   Cardiovascular:  Negative for chest pain and leg swelling.  Musculoskeletal:  Negative for back pain and gait problem.  Skin:  Negative for rash.  Neurological:  Negative for dizziness, weakness and light-headedness.  All other systems reviewed and are negative.   Per HPI unless specifically indicated above   Allergies as of 03/21/2024       Reactions   Penicillins Swelling        Medication List        Accurate as of March 21, 2024  8:45 AM. If you have any questions, ask your nurse or doctor.          amLODipine  10 MG tablet Commonly known as: NORVASC  TAKE 1 TABLET EVERY DAY   apixaban  5 MG Tabs tablet Commonly known as: Eliquis  Take 1 tablet (5 mg total) by mouth 2 (two) times daily.   aspirin  EC 81 MG tablet Take 81 mg by mouth daily. Swallow whole.   chlorthalidone  25 MG tablet Commonly known as: HYGROTON  Take 0.5 tablets (12.5 mg total) by mouth daily.   fenofibrate  145 MG tablet Commonly known as: TRICOR  TAKE 1 TABLET EVERY DAY   losartan  25 MG tablet Commonly known as: COZAAR  Take 1 tablet (25 mg total) by mouth daily.   rosuvastatin  10 MG tablet Commonly known as: CRESTOR  TAKE 1 TABLET EVERY EVENING   tamsulosin  0.4 MG Caps capsule Commonly known as: FLOMAX  Take 1 capsule (0.4 mg total) by mouth daily after  supper.         Objective:   BP 116/67   Pulse 69   Temp 97.9 F (36.6 C) (Temporal)   Ht 5' 11 (1.803 m)   Wt 172 lb 12.8 oz (78.4 kg)   SpO2 96%   BMI 24.10 kg/m   Wt Readings from Last 3 Encounters:  03/21/24 172 lb 12.8 oz (78.4 kg)  12/28/23 183 lb 3.2 oz (83.1 kg)  10/23/23 188 lb (85.3 kg)    Physical Exam Vitals and nursing note reviewed.  Constitutional:      General: He is not in acute distress.    Appearance: He is well-developed. He is not diaphoretic.   Eyes:     General: No scleral icterus.    Conjunctiva/sclera: Conjunctivae  normal.   Neck:     Thyroid : No thyromegaly.   Cardiovascular:     Rate and Rhythm: Normal rate and regular rhythm.     Heart sounds: Normal heart sounds. No murmur heard. Pulmonary:     Effort: Pulmonary effort is normal. No respiratory distress.     Breath sounds: Normal breath sounds. No wheezing.   Musculoskeletal:        General: No swelling. Normal range of motion.     Cervical back: Neck supple.  Lymphadenopathy:     Cervical: No cervical adenopathy.   Skin:    General: Skin is warm and dry.     Findings: No rash.   Neurological:     Mental Status: He is alert and oriented to person, place, and time.     Coordination: Coordination normal.   Psychiatric:        Behavior: Behavior normal.     Results for orders placed or performed in visit on 03/11/24  Basic Metabolic Panel (BMET)   Collection Time: 03/13/24  9:51 AM  Result Value Ref Range   Glucose 116 (H) 70 - 99 mg/dL   BUN 22 8 - 27 mg/dL   Creatinine, Ser 5.40 (H) 0.76 - 1.27 mg/dL   eGFR 44 (L) >98 JX/BJY/7.82   BUN/Creatinine Ratio 14 10 - 24   Sodium 141 134 - 144 mmol/L   Potassium 4.1 3.5 - 5.2 mmol/L   Chloride 101 96 - 106 mmol/L   CO2 24 20 - 29 mmol/L   Calcium  10.8 (H) 8.6 - 10.2 mg/dL    Assessment & Plan:   Problem List Items Addressed This Visit       Cardiovascular and Mediastinum   Essential hypertension   PAD (peripheral artery disease) (HCC)   Peripheral vascular disease, unspecified (HCC)   CAD (coronary artery disease)     Digestive   GERD without esophagitis     Other   Hyperlipidemia LDL goal <130   Prediabetes - Primary   Relevant Orders   Bayer DCA Hb A1c Waived   Other Visit Diagnoses       Stage 3b chronic kidney disease (HCC)         Patient seems to be doing well.  Will try and get him to do an A1c today but the rest of his blood work looks good.  His kidney function is still stage III but stable  Follow up plan: Return in about 6 months (around  09/20/2024), or if symptoms worsen or fail to improve, for Prediabetes and hypertension and hyperlipidemia.  Counseling provided for all of the vaccine components Orders Placed This Encounter  Procedures   Bayer DCA Hb A1c Jory Ng  Ryota Treece, MD [facility] 03/21/2024, 8:45 AM

## 2024-03-25 ENCOUNTER — Other Ambulatory Visit: Payer: Self-pay | Admitting: Family Medicine

## 2024-03-25 NOTE — Addendum Note (Signed)
 Addended by: Cardarius Senat D on: 03/25/2024 12:00 PM   Modules accepted: Orders

## 2024-03-27 DIAGNOSIS — X32XXXD Exposure to sunlight, subsequent encounter: Secondary | ICD-10-CM | POA: Diagnosis not present

## 2024-03-27 DIAGNOSIS — B078 Other viral warts: Secondary | ICD-10-CM | POA: Diagnosis not present

## 2024-03-27 DIAGNOSIS — Z85828 Personal history of other malignant neoplasm of skin: Secondary | ICD-10-CM | POA: Diagnosis not present

## 2024-03-27 DIAGNOSIS — Z08 Encounter for follow-up examination after completed treatment for malignant neoplasm: Secondary | ICD-10-CM | POA: Diagnosis not present

## 2024-03-27 DIAGNOSIS — L570 Actinic keratosis: Secondary | ICD-10-CM | POA: Diagnosis not present

## 2024-03-29 ENCOUNTER — Ambulatory Visit: Attending: Physician Assistant | Admitting: Physician Assistant

## 2024-03-29 ENCOUNTER — Encounter: Payer: Self-pay | Admitting: Physician Assistant

## 2024-03-29 VITALS — BP 120/62 | HR 70 | Ht 71.0 in | Wt 170.0 lb

## 2024-03-29 DIAGNOSIS — I517 Cardiomegaly: Secondary | ICD-10-CM

## 2024-03-29 DIAGNOSIS — D6869 Other thrombophilia: Secondary | ICD-10-CM | POA: Diagnosis not present

## 2024-03-29 DIAGNOSIS — I480 Paroxysmal atrial fibrillation: Secondary | ICD-10-CM | POA: Diagnosis not present

## 2024-03-29 DIAGNOSIS — I10 Essential (primary) hypertension: Secondary | ICD-10-CM | POA: Diagnosis not present

## 2024-03-29 NOTE — Progress Notes (Signed)
 Cardiology Office Note:  .   Date:  03/29/2024  ID:  Tommy [name], DOB Mar 25, 1951, MRN [id number] PCP: [name], Lucio Sabin, MD  [facility] Providers Cardiologist:  Eilleen Grates, MD { EP: Dr. Lawana Pray   History of Present Illness: .   Tommy [name] is a 73 y.o. male w/PMHx of  HTN, PAD s/p SFA stenting 2009, DVT  AFib  2023: Monitor showed 3% AF burden with average rate of 124bpm, 12 pauses with longest lasting 4.5 seconds.  >>> After his monitor, he was started on Toprol  XL and eliquis .   ECHO 10/2022 LVEF 65-70%, no RWMA, severe concentric LVH, G1DD, RV systolic function is hyperdynamic, LA mildly dilated. Cardiac MRI 04/2023 showed no evidence of cardiac amyloid, LVEF 67%, no LGE.    Admitted Oct 2024 with near syncope, EP brought on board with post conversion pauses (reported as long as 8 seconds while at [facility]) noted on tele that were symptomatic Planned for amiodarone  as a bridge to ablation  Saw Brandi 09/13/23, a bit of tremor in his hands since on amio, slight dizzy spells, was in AFib 112bpm  AFib ablation 09/25/23  Saw the Afib clinic 1/113/25, no AFib symptoms, again mentioned hand tremor  I saw him 12/28/23 He feels well Denies any CP, palpitations or cardiac awareness Never has had palpitations Presented after a syncopal event when Af was found >> none since then No weak/dizzy spells, no near syncope No SOB Works part tim in a store, Conservation officer, nature mostly, 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 No  bleeding or signs of bleeding  Eliquis  is expensive Advised he inquire the copay/cost of Xareto, find out  his ins co preferred agent    Arrhythmia/AAD hx Amiodarone  started Oct 2024 AFib ablation 09/25/23 (PFA)  Studies Reviewed: Aaron Aas    EKG done today and reviewed by myself:  SR 70bpm  09/25/23: EPS/ablation CONCLUSIONS: 1. Sinus rhythm upon presentation.   2. Successful electrical isolation and anatomical encircling of all four pulmonary veins with pulse field ablation. 3. Ablation of posterior wall with pulse field ablation 4. No early apparent complications.   Cardiac CT 09/15/23 IMPRESSION: Diffuse mucosal thickening of the esophagus consistent with a mucosal process or esophagitis. IMPRESSION: 1. There is normal pulmonary vein drainage into the left atrium. 2. The left atrial appendage is patent. 3. The esophagus runs in the left atrial midline but is closest to the left inferior pulmonary vein. Aortic Valve: Tri-leaflet. Calcium  score 109.    Risk Assessment/Calculations:    Physical Exam:   VS:  There were no vitals taken for this visit.   Wt Readings from Last 3 Encounters:  03/21/24 172 lb 12.8 oz (78.4 kg)  12/28/23 183 lb 3.2 oz (83.1 kg)  10/23/23 188 lb (85.3 kg)    GEN: Well nourished, well developed in no acute distress NECK: No JVD; No carotid bruits CARDIAC: RRR, no murmurs, rubs, gallops RESPIRATORY: CTA b/l without rales, wheezing or rhonchi  ABDOMEN: Soft, non-tender, non-distended EXTREMITIES: No edema; R hand partial amp (traumatic remotely) 3rd/4th fingers  ASSESSMENT AND PLAN: .  paroxysmal AFib Post conversion pauses Now s/p ablation CHA2DS2Vasc is 5, on Eliquis , appropriately dosed Low/no burden based on symptoms, uncertain he would know if he was in Afib but does have an apple watch with no alerts off amiodarone  with tremor much improved (not noted today)   HTN Looks OK  LVH No SOB, DOE Deferred to Dr. Pecola Bouquet  Secondary hypercoagulable state 2/2 AFib     Dispo: back in 49mo, sooner if needed  Signed, Debbie Fails, PA-C

## 2024-03-29 NOTE — Patient Instructions (Signed)
 Medication Instructions:   SEE COST OF XARELTO FOR AFFORDABILITY    *If you need a refill on your cardiac medications before your next appointment, please call your pharmacy*  Lab Work: NONE ORDERED  TODAY    If you have labs (blood work) drawn today and your tests are completely normal, you will receive your results only by: MyChart Message (if you have MyChart) OR A paper copy in the mail If you have any lab test that is abnormal or we need to change your treatment, we will call you to review the results.  Testing/Procedures: NONE ORDERED  TODAY    Follow-Up: At [facility], you and your health needs are our priority.  As part of our continuing mission to provide you with exceptional heart care, our providers are all part of one team.  This team includes your primary Cardiologist (physician) and Advanced Practice Providers or APPs (Physician Assistants and Nurse Practitioners) who all work together to provide you with the care you need, when you need it.  Your next appointment:   6 month(s)  Provider:   You may see Dr. Lawana Pray  or one of the following Advanced Practice Providers on your designated Care Team:   Mertha Abrahams, [place]  We recommend signing up for the patient portal called MyChart.  Sign up information is provided on this After Visit Summary.  MyChart is used to connect with patients for Virtual Visits (Telemedicine).  Patients are able to view lab/test results, encounter notes, upcoming appointments, etc.  Non-urgent messages can be sent to your provider as well.   To learn more about what you can do with MyChart, go to [email or web address].   Other Instructions

## 2024-04-01 ENCOUNTER — Ambulatory Visit (HOSPITAL_COMMUNITY)
Admission: RE | Admit: 2024-04-01 | Discharge: 2024-04-01 | Discharge status: Home / Self Care | Source: Ambulatory Visit | Attending: Surgery | Admitting: Surgery

## 2024-04-01 ENCOUNTER — Ambulatory Visit: Attending: Surgery | Admitting: Surgery

## 2024-04-01 ENCOUNTER — Encounter: Payer: Self-pay | Admitting: Surgery

## 2024-04-01 ENCOUNTER — Ambulatory Visit (HOSPITAL_COMMUNITY)
Admission: RE | Admit: 2024-04-01 | Discharge: 2024-04-01 | Disposition: A | Discharge status: Home / Self Care | Source: Ambulatory Visit | Attending: Surgery | Admitting: Surgery

## 2024-04-01 VITALS — BP 124/76 | HR 55 | Temp 98.0°F | Resp 20 | Ht 71.0 in | Wt 170.0 lb

## 2024-04-01 DIAGNOSIS — I70212 Atherosclerosis of native arteries of extremities with intermittent claudication, left leg: Secondary | ICD-10-CM

## 2024-04-01 DIAGNOSIS — I7143 Infrarenal abdominal aortic aneurysm, without rupture: Secondary | ICD-10-CM

## 2024-04-01 DIAGNOSIS — I739 Peripheral vascular disease, unspecified: Secondary | ICD-10-CM

## 2024-04-01 LAB — VAS US ABI WITH/WO TBI
Left ABI: 1.13
Right ABI: 1.12

## 2024-04-01 NOTE — Progress Notes (Signed)
 [facility] [place]  Patient name: Tommy [name] MRN: [id number] DOB: August 10, 1951 Sex: male   REASON FOR VISIT:    Follow up  HISOTRY OF PRESENT ILLNESS:    Tommy [name] is a 73 y.o. male who is status post left SFA stent and October 2009 for claudication.  We are also following him for an abdominal aortic aneurysm.  He denies any abdominal pain.  He denies claudication symptoms.  The patient is a former smoker.  He is on Eliquis  for atrial fibrillation   PAST MEDICAL HISTORY:   Past Medical History:  Diagnosis Date   BPH with obstruction/lower urinary tract symptoms    hyperplasia   ED (erectile dysfunction)    arterial insuff.   History of DVT of lower extremity    History of gastric ulcer    Hyperlipidemia    Hypertension    Peripheral vascular disease (HCC)    Prostate cancer (HCC) DX 01/01/15--  urologist-  dr watt   Stage T1c, Gleason 3+4, PSA 5.73, vol 31ml   Wears glasses      FAMILY HISTORY:   Family History  Problem Relation Age of Onset   Heart disease Mother    Cancer Mother    Deep vein thrombosis Mother    Diabetes Mother    Hyperlipidemia Mother    Hypertension Mother    Heart attack Mother    Peripheral vascular disease Mother    Cancer Father    Colon cancer Neg Hx     SOCIAL HISTORY:   Social History   Tobacco Use   Smoking status: Former    Current packs/day: 0.00    Average packs/day: 1 pack/day for 15.0 years (15.0 ttl pk-yrs)    Types: Cigarettes    Start date: 09/09/2010    Quit date: 04/21/2011    Years since quitting: 12.9   Smokeless tobacco: Current    Types: Snuff   Tobacco comments:    occasional  someday dips tobacco  Substance Use Topics   Alcohol use: No    Alcohol/week: 0.0 standard drinks of alcohol     ALLERGIES:   Allergies  Allergen Reactions   Penicillins Swelling and Nausea Only     CURRENT MEDICATIONS:   Current Outpatient Medications   Medication Sig Dispense Refill   amLODipine  (NORVASC ) 10 MG tablet TAKE 1 TABLET EVERY DAY 90 tablet 0   apixaban  (ELIQUIS ) 5 MG TABS tablet Take 1 tablet (5 mg total) by mouth 2 (two) times daily. 180 tablet 1   aspirin  EC 81 MG tablet Take 81 mg by mouth daily. Swallow whole.     chlorthalidone  (HYGROTON ) 25 MG tablet Take 0.5 tablets (12.5 mg total) by mouth daily.     fenofibrate  (TRICOR ) 145 MG tablet TAKE 1 TABLET EVERY DAY 90 tablet 0   losartan  (COZAAR ) 25 MG tablet Take 1 tablet (25 mg total) by mouth daily. 90 tablet 2   rosuvastatin  (CRESTOR ) 10 MG tablet TAKE 1 TABLET EVERY EVENING 90 tablet 3   tamsulosin  (FLOMAX ) 0.4 MG CAPS capsule Take 1 capsule (0.4 mg total) by mouth daily after supper. 90 capsule 3   No current facility-administered medications for this visit.    REVIEW OF SYSTEMS:   [X]  denotes positive finding, [ ]  denotes negative finding Cardiac  Comments:  Chest pain or chest pressure:    Shortness of breath upon exertion:    Short of breath when lying flat:    Irregular heart rhythm:  Vascular    Pain in calf, thigh, or hip brought on by ambulation:    Pain in feet at night that wakes you up from your sleep:     Blood clot in your veins:    Leg swelling:         Pulmonary    Oxygen at home:    Productive cough:     Wheezing:         Neurologic    Sudden weakness in arms or legs:     Sudden numbness in arms or legs:     Sudden onset of difficulty speaking or slurred speech:    Temporary loss of vision in one eye:     Problems with dizziness:         Gastrointestinal    Blood in stool:     Vomited blood:         Genitourinary    Burning when urinating:     Blood in urine:        Psychiatric    Major depression:         Hematologic    Bleeding problems:    Problems with blood clotting too easily:        Skin    Rashes or ulcers:        Constitutional    Fever or chills:      PHYSICAL EXAM:   Vitals:   04/01/24 1020  BP:  124/76  Pulse: (!) 55  Resp: 20  Temp: 98 F (36.7 C)  SpO2: 91%  Weight: 170 lb (77.1 kg)  Height: 5' 11 (1.803 m)    GENERAL: The patient is a well-nourished male, in no acute distress. The vital signs are documented above. CARDIAC: There is a regular rate and rhythm.  VASCULAR: Palpable PULMONARY: Non-labored respirations ABDOMEN: Soft and non-tender MUSCULOSKELETAL: There are no major deformities or cyanosis. NEUROLOGIC: No focal weakness or paresthesias are detected. SKIN: There are no ulcers or rashes noted. PSYCHIATRIC: The patient has a normal affect.  STUDIES:   I have reviewed the following: Abdominal Aorta: There is evidence of abnormal dilatation of the mid and  distal Abdominal aorta. There is evidence of abnormal dilation of the  Right Common Iliac artery and Left Common Iliac artery. The largest aortic  measurement is 4.7 cm. The largest  aortic diameter has increased compared to prior exam. Previous diameter  measurement was 3.0 cm obtained on 01/30/23.  IVC/Iliac: There is no evidence of thrombus involving the IVC.     +-------+-----------+-----------+------------+------------+  ABI/TBIToday's ABIToday's TBIPrevious ABIPrevious TBI  +-------+-----------+-----------+------------+------------+  Right 1.12       1.07       1.12        0.9           +-------+-----------+-----------+------------+------------+  Left  1.13       0.98       1.11        0.9           +-------+-----------+-----------+------------+------------+  MEDICAL ISSUES:   PAD: Normal ABIs with palpable pedal pulses.  He will need repeat ABIs in 1 year  AAA: Maximal aortic diameter is 4.7 cm which is stable.  I will schedule him for angiogram in 6 months    Malvina Serene CLORE, MD, FACS [facility] [phone] Pager [phone]

## 2024-04-10 ENCOUNTER — Other Ambulatory Visit: Payer: Self-pay | Admitting: Family Medicine

## 2024-04-10 MED ORDER — CHLORTHALIDONE 25 MG PO TABS
12.5000 mg | ORAL_TABLET | Freq: Every day | ORAL | 3 refills | Status: AC
Start: 2024-04-10 — End: ?

## 2024-04-29 ENCOUNTER — Other Ambulatory Visit: Payer: Self-pay | Admitting: Family Medicine

## 2024-04-29 DIAGNOSIS — I10 Essential (primary) hypertension: Secondary | ICD-10-CM

## 2024-04-29 DIAGNOSIS — E785 Hyperlipidemia, unspecified: Secondary | ICD-10-CM

## 2024-05-31 ENCOUNTER — Encounter: Payer: Self-pay | Admitting: Radiology

## 2024-06-06 ENCOUNTER — Other Ambulatory Visit: Payer: Self-pay | Admitting: Family Medicine

## 2024-06-17 ENCOUNTER — Ambulatory Visit (INDEPENDENT_AMBULATORY_CARE_PROVIDER_SITE_OTHER): Payer: Medicare HMO

## 2024-06-17 VITALS — BP 124/76 | HR 55 | Ht 71.0 in | Wt 170.0 lb

## 2024-06-17 DIAGNOSIS — Z1211 Encounter for screening for malignant neoplasm of colon: Secondary | ICD-10-CM

## 2024-06-17 DIAGNOSIS — Z0000 Encounter for general adult medical examination without abnormal findings: Secondary | ICD-10-CM | POA: Diagnosis not present

## 2024-06-17 NOTE — Patient Instructions (Signed)
 Tommy [name],  Thank you for taking the time for your Medicare Wellness Visit. I appreciate your continued commitment to your health goals. Please review the care plan we discussed, and feel free to reach out if I can assist you further.  Medicare recommends these wellness visits once per year to help you and your care team stay ahead of potential health issues. These visits are designed to focus on prevention, allowing your provider to concentrate on managing your acute and chronic conditions during your regular appointments.  Please note that Annual Wellness Visits do not include a physical exam. Some assessments may be limited, especially if the visit was conducted virtually. If needed, we may recommend a separate in-person follow-up with your provider.  Ongoing Care Seeing your primary care provider every 3 to 6 months helps us  monitor your health and provide consistent, personalized care.   Referrals If a referral was made during today's visit and you haven't received any updates within two weeks, please contact the referred provider directly to check on the status.  Recommended Screenings:  Health Maintenance  Topic Date Due   Colon Cancer Screening  07/11/2015   Flu Shot  05/10/2024   COVID-19 Vaccine (5 - 2025-26 season) 06/10/2024   Medicare Annual Wellness Visit  06/14/2024   DTaP/Tdap/Td vaccine (2 - Td or Tdap) 08/04/2026   Pneumococcal Vaccine for age over 15  Completed   Hepatitis C Screening  Completed   Zoster (Shingles) Vaccine  Completed   HPV Vaccine  Aged Out   Meningitis B Vaccine  Aged Out       06/17/2024    9:20 AM  Advanced Directives  Does Patient Have a Medical Advance Directive? Yes  Type of Estate agent of [place];Living will  Copy of Healthcare Power of Attorney in Chart? No - copy requested   Advance Care Planning is important because it: Ensures you receive medical care that aligns with your values, goals, and  preferences. Provides guidance to your family and loved ones, reducing the emotional burden of decision-making during critical moments.  Vision: Annual vision screenings are recommended for early detection of glaucoma, cataracts, and diabetic retinopathy. These exams can also reveal signs of chronic conditions such as diabetes and high blood pressure.  Dental: Annual dental screenings help detect early signs of oral cancer, gum disease, and other conditions linked to overall health, including heart disease and diabetes.  Please see the attached documents for additional preventive care recommendations.

## 2024-06-17 NOTE — Progress Notes (Signed)
 Subjective:   Tommy [name] is a 73 y.o. who presents for a Medicare Wellness preventive visit.  As a reminder, Annual Wellness Visits don't include a physical exam, and some assessments may be limited, especially if this visit is performed virtually. We may recommend an in-person follow-up visit with your provider if needed.  Visit Complete: Virtual I connected with  Tommy [name] on 06/17/24 by a audio enabled telemedicine application and verified that I am speaking with the correct person using two identifiers.  Patient Location: Home  Provider Location: Home Office  I discussed the limitations of evaluation and management by telemedicine. The patient expressed understanding and agreed to proceed.  Vital Signs: Because this visit was a virtual/telehealth visit, some criteria may be missing or patient reported. Any vitals not documented were not able to be obtained and vitals that have been documented are patient reported.  VideoDeclined- This patient declined Librarian, academic. Therefore the visit was completed with audio only.  Persons Participating in Visit: Patient.  AWV Questionnaire: No: Patient Medicare AWV questionnaire was not completed prior to this visit.  Cardiac Risk Factors include: advanced age (>81men, >40 women);smoking/ tobacco exposure;dyslipidemia;hypertension     Objective:    Today's Vitals   06/17/24 0916  BP: 124/76  Pulse: (!) 55  Weight: 170 lb (77.1 kg)  Height: 5' 11 (1.803 m)   Body mass index is 23.71 kg/m.     06/17/2024    9:20 AM 09/25/2023    9:07 AM 08/09/2023    9:20 AM 06/15/2023   11:33 AM 06/01/2022   12:07 PM 12/29/2021   10:11 AM 04/30/2021   10:16 AM  Advanced Directives  Does Patient Have a Medical Advance Directive? Yes Yes Yes Yes No Yes Yes  Type of Estate agent of [place];Living will Healthcare Power of [place] Living will Healthcare Power of [place];Living will   Healthcare  Power of [place];Living will  Copy of Healthcare Power of Attorney in Chart? No - copy requested   No - copy requested   No - copy requested  Would patient like information on creating a medical advance directive?     No - Guardian declined      Current Medications (verified) Outpatient Encounter Medications as of 06/17/2024  Medication Sig   amLODipine  (NORVASC ) 10 MG tablet TAKE 1 TABLET EVERY DAY   apixaban  (ELIQUIS ) 5 MG TABS tablet TAKE 1 TABLET TWICE DAILY   aspirin  EC 81 MG tablet Take 81 mg by mouth daily. Swallow whole.   chlorthalidone  (HYGROTON ) 25 MG tablet Take 0.5 tablets (12.5 mg total) by mouth daily.   fenofibrate  (TRICOR ) 145 MG tablet TAKE 1 TABLET EVERY DAY   losartan  (COZAAR ) 25 MG tablet Take 1 tablet (25 mg total) by mouth daily.   rosuvastatin  (CRESTOR ) 10 MG tablet TAKE 1 TABLET EVERY EVENING   tamsulosin  (FLOMAX ) 0.4 MG CAPS capsule Take 1 capsule (0.4 mg total) by mouth daily after supper.   No facility-administered encounter medications on file as of 06/17/2024.    Allergies (verified) Penicillins   History: Past Medical History:  Diagnosis Date   Allergy    BPH with obstruction/lower urinary tract symptoms    hyperplasia   ED (erectile dysfunction)    arterial insuff.   History of DVT of lower extremity    History of gastric ulcer    Hyperlipidemia    Hypertension    Peripheral vascular disease (HCC)    Prostate cancer (HCC) DX 01/01/15--  urologist-  dr watt   Stage T1c, Gleason 3+4, PSA 5.73, vol 31ml   Wears glasses    Past Surgical History:  Procedure Laterality Date   ATRIAL FIBRILLATION ABLATION N/A 09/25/2023   Procedure: ATRIAL FIBRILLATION ABLATION;  Surgeon: Inocencio Soyla Lunger, MD;  Location: [facility];  Service: Cardiovascular;  Laterality: N/A;   FEMORAL ARTERY STENT Left 07-22-2008  &  11-16-2010   left SFA//   In-Stent restenosis   ORIF LEFT INDEX FINGER FX  09-28-2006   PROSTATE BIOPSY  01/01/15   PROSTATE SURGERY   July 2016   Seed Implant   RADIOACTIVE SEED IMPLANT N/A 04/23/2015   Procedure: RADIOACTIVE SEED IMPLANT/BRACHYTHERAPY IMPLANT;  Surgeon: Norleen watt, MD;  Location: [facility];  Service: Urology;  Laterality: N/A;   TONSILLECTOMY  as child   Family History  Problem Relation Age of Onset   Heart disease Mother    Cancer Mother    Deep vein thrombosis Mother    Diabetes Mother    Hyperlipidemia Mother    Hypertension Mother    Heart attack Mother    Peripheral vascular disease Mother    Cancer Father    Colon cancer Neg Hx    Social History   Socioeconomic History   Marital status: Married    Spouse name: Tilton   Number of children: 3   Years of education: Not on file   Highest education level: 12th grade  Occupational History    Comment: [employer]  Tobacco Use   Smoking status: Former    Current packs/day: 0.00    Average packs/day: 1 pack/day for 15.0 years (15.0 ttl pk-yrs)    Types: Cigarettes    Start date: 09/09/2010    Quit date: 04/21/2011    Years since quitting: 13.1   Smokeless tobacco: Current    Types: Snuff   Tobacco comments:    occasional  someday dips tobacco  Vaping Use   Vaping status: Never Used  Substance and Sexual Activity   Alcohol use: No    Alcohol/week: 0.0 standard drinks of alcohol   Drug use: No   Sexual activity: Not on file  Other Topics Concern   Not on file  Social History Narrative   2 daughters and one son - all live nearby   Social Drivers of Corporate investment banker Strain: Low Risk  (06/17/2024)   Overall Financial Resource Strain (CARDIA)    Difficulty of Paying Living Expenses: Not hard at all  Food Insecurity: No Food Insecurity (06/17/2024)   Hunger Vital Sign    Worried About Running Out of Food in the Last Year: Never true    Ran Out of Food in the Last Year: Never true  Transportation Needs: No Transportation Needs (06/17/2024)   PRAPARE -  Administrator, Civil Service (Medical): No    Lack of Transportation (Non-Medical): No  Physical Activity: Insufficiently Active (06/17/2024)   Exercise Vital Sign    Days of Exercise per Week: 3 days    Minutes of Exercise per Session: 30 min  Stress: No Stress Concern Present (06/17/2024)   [employer] of Occupational Health - Occupational Stress Questionnaire    Feeling of Stress: Only a little  Social Connections: Socially Integrated (06/17/2024)   Social Connection and Isolation Panel    Frequency of Communication with Friends and Family: More than three times a week    Frequency of Social Gatherings  with Friends and Family: Twice a week    Attends Religious Services: More than 4 times per year    Active Member of Clubs or Organizations: No    Attends Engineer, structural: More than 4 times per year    Marital Status: Married    Tobacco Counseling Ready to quit: Not Answered Counseling given: Yes Tobacco comments: occasional  someday dips tobacco    Clinical Intake:  Pre-visit preparation completed: Yes  Pain : No/denies pain     BMI - recorded: 23.71 Nutritional Status: BMI of 19-24  Normal Nutritional Risks: None Diabetes: No  Lab Results  Component Value Date   HGBA1C 5.1 03/21/2024   HGBA1C 5.2 09/21/2023   HGBA1C 5.7 (H) 03/22/2023     How often do you need to have someone help you when you read instructions, pamphlets, or other written materials from your doctor or pharmacy?: 1 - Never  Interpreter Needed?: No  Information entered by :: alia t/cma   Activities of Daily Living     06/17/2024    9:20 AM  In your present state of health, do you have any difficulty performing the following activities:  Hearing? 1  Vision? 0  Difficulty concentrating or making decisions? 0  Walking or climbing stairs? 0  Dressing or bathing? 0  Doing errands, shopping? 0  Preparing Food and eating ? N  Using the Toilet? N  In the past six  months, have you accidently leaked urine? N  Do you have problems with loss of bowel control? N  Managing your Medications? N  Managing your Finances? N  Housekeeping or managing your Housekeeping? N    Patient Care Team: [name], Fonda LABOR, MD as PCP - General (Family Medicine) Lavona Agent, MD as PCP - Cardiology (Cardiology) Cindie Carlin POUR, DO as Consulting Physician (Internal Medicine) Watt Rush, MD as Attending Physician (Urology)  I have updated your Care Teams any recent Medical Services you may have received from other providers in the past year.     Assessment:   This is a routine wellness examination for [place].  Hearing/Vision screen Hearing Screening - Comments:: Pt stated that he don't hear good Vision Screening - Comments:: Pt wear glasses/pt goes to [facility] in [place]/last ov 3/25   Goals Addressed   None    Depression Screen     06/17/2024    9:21 AM 03/21/2024    8:18 AM 09/21/2023    8:10 AM 08/23/2023   10:34 AM 08/09/2023    8:04 AM 06/15/2023   11:32 AM 03/22/2023    8:01 AM  PHQ 2/9 Scores  PHQ - 2 Score 1 2 0 1 0 0 0  PHQ- 9 Score  5 1 1 1  1     Fall Risk     06/17/2024    9:14 AM 03/21/2024    8:19 AM 09/21/2023    8:10 AM 08/23/2023   10:34 AM 08/09/2023    8:04 AM  Fall Risk   Falls in the past year? 0 0 0 1 1  Number falls in past yr: 0 0  0 0  Injury with Fall? 0 0  0 0  Risk for fall due to : No Fall Risks No Fall Risks  Impaired balance/gait Impaired balance/gait  Follow up Falls evaluation completed;Education provided;Falls prevention discussed Falls evaluation completed  Falls evaluation completed Falls evaluation completed    MEDICARE RISK AT HOME:  Medicare Risk at Home Any stairs in or around the  home?: No If so, are there any without handrails?: No Home free of loose throw rugs in walkways, pet beds, electrical cords, etc?: Yes Adequate lighting in your home to reduce risk of falls?: Yes Life alert?: Yes Use of a  cane, walker or w/c?: No Grab bars in the bathroom?: No Shower chair or bench in shower?: No Elevated toilet seat or a handicapped toilet?: No  TIMED UP AND GO:  Was the test performed?  no  Cognitive Function: 6CIT completed        06/17/2024    9:24 AM 06/15/2023   11:34 AM 06/01/2022   12:08 PM 04/30/2021   10:16 AM  6CIT Screen  What Year? 0 points 0 points 0 points 0 points  What month? 0 points 0 points 0 points 0 points  What time? 0 points 0 points 0 points 0 points  Count back from 20 0 points 0 points 0 points 0 points  Months in reverse 0 points 0 points 0 points 0 points  Repeat phrase 0 points 0 points 0 points 0 points  Total Score 0 points 0 points 0 points 0 points    Immunizations Immunization History  Administered Date(s) Administered   Fluad Quad(high Dose 65+) 07/03/2019, 07/14/2021, 07/19/2022   Fluad Trivalent(High Dose 65+) 07/18/2023   INFLUENZA, HIGH DOSE SEASONAL PF 07/05/2018   Influenza Split 07/13/2013   Influenza Whole 07/12/2010   Influenza,inj,Quad PF,6+ Mos 07/23/2014, 07/20/2015, 07/07/2016, 07/26/2017   Moderna Covid-19 Vaccine Bivalent Booster 54yrs & up 08/10/2021   Moderna Sars-Covid-2 Vaccination 12/05/2019, 01/03/2020, 10/15/2020   Pneumococcal Conjugate-13 07/07/2016, 01/22/2018   Pneumococcal Polysaccharide-23 10/11/2001, 10/11/2001, 10/11/2001, 07/03/2019   Tdap 08/04/2016   Zoster Recombinant(Shingrix ) 11/22/2021, 12/15/2022   Zoster, Live 08/08/2013    Screening Tests Health Maintenance  Topic Date Due   Colonoscopy  07/11/2015   Influenza Vaccine  05/10/2024   COVID-19 Vaccine (5 - 2025-26 season) 06/10/2024   Medicare Annual Wellness (AWV)  06/17/2025   DTaP/Tdap/Td (2 - Td or Tdap) 08/04/2026   Pneumococcal Vaccine: 50+ Years  Completed   Hepatitis C Screening  Completed   Zoster Vaccines- Shingrix   Completed   HPV VACCINES  Aged Out   Meningococcal B Vaccine  Aged Out    Health Maintenance Items  Addressed: Referral sent to GI for colonoscopy  Additional Screening:  Vision Screening: Recommended annual ophthalmology exams for early detection of glaucoma and other disorders of the eye. Is the patient up to date with their annual eye exam?  No  Who is the provider or what is the name of the office in which the patient attends annual eye exams? [facility]  Dental Screening: Recommended annual dental exams for proper oral hygiene  Community Resource Referral / Chronic Care Management: CRR required this visit?  No   CCM required this visit?  No   Plan:    I have personally reviewed and noted the following in the patient's chart:   Medical and social history Use of alcohol, tobacco or illicit drugs  Current medications and supplements including opioid prescriptions. Patient is not currently taking opioid prescriptions. Functional ability and status Nutritional status Physical activity Advanced directives List of other physicians Hospitalizations, surgeries, and ER visits in previous 12 months Vitals Screenings to include cognitive, depression, and falls Referrals and appointments  In addition, I have reviewed and discussed with patient certain preventive protocols, quality metrics, and best practice recommendations. A written personalized care plan for preventive services as well as general preventive  health recommendations were provided to patient.   Ozie Ned, CMA   06/17/2024   After Visit Summary: (MyChart) Due to this being a telephonic visit, the after visit summary with patients personalized plan was offered to patient via MyChart   Notes: PCP Follow Up Recommendations: pt is aware and due for the following: colonoscopy, ordered.

## 2024-08-01 ENCOUNTER — Encounter: Payer: Self-pay | Admitting: Cardiology

## 2024-08-12 ENCOUNTER — Encounter: Payer: Self-pay | Admitting: Radiology

## 2024-08-12 ENCOUNTER — Other Ambulatory Visit: Payer: Self-pay

## 2024-08-12 DIAGNOSIS — I7143 Infrarenal abdominal aortic aneurysm, without rupture: Secondary | ICD-10-CM

## 2024-08-18 ENCOUNTER — Other Ambulatory Visit: Payer: Self-pay | Admitting: *Deleted

## 2024-08-18 ENCOUNTER — Other Ambulatory Visit: Payer: Self-pay | Admitting: Physician Assistant

## 2024-09-10 ENCOUNTER — Other Ambulatory Visit: Payer: Self-pay | Admitting: Cardiology

## 2024-09-19 ENCOUNTER — Ambulatory Visit (HOSPITAL_COMMUNITY)
Admission: RE | Admit: 2024-09-19 | Discharge: 2024-09-19 | Disposition: A | Discharge status: Home / Self Care | Source: Ambulatory Visit | Attending: Surgery | Admitting: Surgery

## 2024-09-19 DIAGNOSIS — I7143 Infrarenal abdominal aortic aneurysm, without rupture: Secondary | ICD-10-CM | POA: Diagnosis present

## 2024-09-19 MED ORDER — IOHEXOL 350 MG/ML SOLN
80.0000 mL | Freq: Once | INTRAVENOUS | Status: AC | PRN
  Administered 2024-09-19: 80 mL via INTRAVENOUS

## 2024-09-23 ENCOUNTER — Ambulatory Visit: Admitting: Surgery

## 2024-09-25 ENCOUNTER — Encounter: Payer: Self-pay | Admitting: Family Medicine

## 2024-09-25 ENCOUNTER — Ambulatory Visit: Payer: Self-pay | Admitting: Family Medicine

## 2024-09-25 VITALS — BP 131/79 | HR 64 | Ht 71.0 in | Wt 172.0 lb

## 2024-09-25 DIAGNOSIS — Z23 Encounter for immunization: Secondary | ICD-10-CM

## 2024-09-25 DIAGNOSIS — N400 Enlarged prostate without lower urinary tract symptoms: Secondary | ICD-10-CM

## 2024-09-25 DIAGNOSIS — R7303 Prediabetes: Secondary | ICD-10-CM | POA: Diagnosis not present

## 2024-09-25 DIAGNOSIS — I480 Paroxysmal atrial fibrillation: Secondary | ICD-10-CM | POA: Diagnosis not present

## 2024-09-25 DIAGNOSIS — E785 Hyperlipidemia, unspecified: Secondary | ICD-10-CM | POA: Diagnosis not present

## 2024-09-25 DIAGNOSIS — I10 Essential (primary) hypertension: Secondary | ICD-10-CM

## 2024-09-25 LAB — LIPID PANEL
Chol/HDL Ratio: 3.4 ratio (ref 0.0–5.0)
Cholesterol, Total: 148 mg/dL (ref 100–199)
HDL: 43 mg/dL (ref >39)
LDL Chol Calc (NIH): 89 mg/dL (ref 0–99)
Triglycerides: 81 mg/dL (ref 0–149)
VLDL Cholesterol Cal: 16 mg/dL (ref 5–40)

## 2024-09-25 LAB — CMP14+EGFR
ALT: 22 IU/L (ref 0–44)
AST: 21 IU/L (ref 0–40)
Albumin: 4.4 g/dL (ref 3.8–4.8)
Alkaline Phosphatase: 33 IU/L — ABNORMAL LOW (ref 47–123)
BUN/Creatinine Ratio: 21 (ref 10–24)
BUN: 27 mg/dL (ref 8–27)
Bilirubin Total: 1.1 mg/dL (ref 0.0–1.2)
CO2: 27 mmol/L (ref 20–29)
Calcium: 11.2 mg/dL — ABNORMAL HIGH (ref 8.6–10.2)
Chloride: 103 mmol/L (ref 96–106)
Creatinine, Ser: 1.28 mg/dL — ABNORMAL HIGH (ref 0.76–1.27)
Globulin, Total: 2.2 g/dL (ref 1.5–4.5)
Glucose: 110 mg/dL — ABNORMAL HIGH (ref 70–99)
Potassium: 4.4 mmol/L (ref 3.5–5.2)
Sodium: 143 mmol/L (ref 134–144)
Total Protein: 6.6 g/dL (ref 6.0–8.5)
eGFR: 58 mL/min/1.73 — ABNORMAL LOW (ref >59)

## 2024-09-25 LAB — CBC WITH DIFFERENTIAL/PLATELET
Basophils Absolute: 0 x10E3/uL (ref 0.0–0.2)
Basos: 0 %
EOS (ABSOLUTE): 0.1 x10E3/uL (ref 0.0–0.4)
Eos: 2 %
Hematocrit: 42.3 % (ref 37.5–51.0)
Hemoglobin: 14.2 g/dL (ref 13.0–17.7)
Immature Grans (Abs): 0.1 x10E3/uL (ref 0.0–0.1)
Immature Granulocytes: 1 %
Lymphocytes Absolute: 1.2 x10E3/uL (ref 0.7–3.1)
Lymphs: 25 %
MCH: 29.8 pg (ref 26.6–33.0)
MCHC: 33.6 g/dL (ref 31.5–35.7)
MCV: 89 fL (ref 79–97)
Monocytes Absolute: 0.5 x10E3/uL (ref 0.1–0.9)
Monocytes: 9 %
Neutrophils Absolute: 3 x10E3/uL (ref 1.4–7.0)
Neutrophils: 63 %
Platelets: 200 x10E3/uL (ref 150–450)
RBC: 4.77 x10E6/uL (ref 4.14–5.80)
RDW: 12.2 % (ref 11.6–15.4)
WBC: 4.8 x10E3/uL (ref 3.4–10.8)

## 2024-09-25 LAB — PSA, TOTAL AND FREE
PSA, Free: <0.02 ng/mL
Prostate Specific Ag, Serum: <0.1 ng/mL (ref 0.0–4.0)

## 2024-09-25 LAB — BAYER DCA HB A1C WAIVED: HB A1C (BAYER DCA - WAIVED): 5.3 % (ref 4.8–5.6)

## 2024-09-25 MED ORDER — FENOFIBRATE 145 MG PO TABS: 145.0000 mg | ORAL_TABLET | Freq: Every day | ORAL | 3 refills | Status: AC

## 2024-09-25 MED ORDER — TAMSULOSIN HCL 0.4 MG PO CAPS: 0.4000 mg | ORAL_CAPSULE | Freq: Every day | ORAL | 3 refills | Status: AC

## 2024-09-25 MED ORDER — APIXABAN 5 MG PO TABS: 5.0000 mg | ORAL_TABLET | Freq: Two times a day (BID) | ORAL | 3 refills | Status: AC

## 2024-09-25 MED ORDER — AMLODIPINE BESYLATE 10 MG PO TABS: 10.0000 mg | ORAL_TABLET | Freq: Every day | ORAL | 3 refills | Status: AC

## 2024-09-25 NOTE — Progress Notes (Signed)
 BP 131/79   Pulse 64   Ht 5' 11 (1.803 m)   Wt 172 lb (78 kg)   SpO2 98%   BMI 23.99 kg/m    Subjective:   Patient ID: Tommy [name], male    DOB: [birth year], 73 y.o.   MRN: [id number]  HPI: Tommy [name] is a 73 y.o. male presenting on 09/25/2024 for Medical Management of Chronic Issues, Hyperlipidemia, Hypertension, and Prediabetes   Discussed the use of AI scribe software for clinical note transcription with the patient, who gave verbal consent to proceed.  History of Present Illness   Tommy [name] is a 73 year old male with atrial fibrillation who presents for a recheck of his condition.  Atrial fibrillation - No symptoms of palpitations, racing or fluttering heart sensations, or shortness of breath - No new medications prescribed for atrial fibrillation - Follow-up appointment with cardiology delayed, now scheduled for January 12th - MRI completed, awaiting discussion of results  Hypertension - Currently taking amlodipine , chlorthalidone , and losartan  for blood pressure management  Lower urinary tract symptoms - Taking Flomax  for urinary symptoms - Continued improvement in urinary flow  Glycemic control - No issues with blood sugar levels          Relevant past medical, surgical, family and social history reviewed and updated as indicated. Interim medical history since our last visit reviewed. Allergies and medications reviewed and updated.  Review of Systems  Constitutional:  Negative for chills and fever.  Eyes:  Negative for visual disturbance.  Respiratory:  Negative for shortness of breath and wheezing.   Cardiovascular:  Negative for chest pain and leg swelling.  Musculoskeletal:  Negative for back pain and gait problem.  Skin:  Negative for rash.  Neurological:  Negative for dizziness and light-headedness.  All other systems reviewed and are negative.   Per HPI unless specifically indicated above   Allergies as of 09/25/2024       Reactions    Penicillins Swelling, Nausea Only        Medication List        Accurate as of September 25, 2024  8:19 AM. If you have any questions, ask your nurse or doctor.          amLODipine  10 MG tablet Commonly known as: NORVASC  Take 1 tablet (10 mg total) by mouth daily.   apixaban  5 MG Tabs tablet Commonly known as: Eliquis  Take 1 tablet (5 mg total) by mouth 2 (two) times daily.   aspirin  EC 81 MG tablet Take 81 mg by mouth daily. Swallow whole.   chlorthalidone  25 MG tablet Commonly known as: HYGROTON  Take 0.5 tablets (12.5 mg total) by mouth daily.   fenofibrate  145 MG tablet Commonly known as: TRICOR  Take 1 tablet (145 mg total) by mouth daily.   losartan  25 MG tablet Commonly known as: COZAAR  TAKE 1 TABLET EVERY DAY (NEED MD APPOINTMENT FOR REFILLS)   rosuvastatin  10 MG tablet Commonly known as: CRESTOR  TAKE 1 TABLET EVERY EVENING   tamsulosin  0.4 MG Caps capsule Commonly known as: FLOMAX  Take 1 capsule (0.4 mg total) by mouth daily after supper.         Objective:   BP 131/79   Pulse 64   Ht 5' 11 (1.803 m)   Wt 172 lb (78 kg)   SpO2 98%   BMI 23.99 kg/m   Wt Readings from Last 3 Encounters:  09/25/24 172 lb (78 kg)  06/17/24 170 lb (77.1 kg)  04/01/24  170 lb (77.1 kg)    Physical Exam Physical Exam   VITALS: BP- 131/79 CHEST: Lungs clear to auscultation bilaterally. CARDIOVASCULAR: Regular heart rate and rhythm, no murmurs. EXTREMITIES: No edema, good peripheral pulses.         Assessment & Plan:   Problem List Items Addressed This Visit       Cardiovascular and Mediastinum   Essential hypertension   Relevant Medications   amLODipine  (NORVASC ) 10 MG tablet   fenofibrate  (TRICOR ) 145 MG tablet   apixaban  (ELIQUIS ) 5 MG TABS tablet   Atrial fibrillation (HCC)   Relevant Medications   amLODipine  (NORVASC ) 10 MG tablet   fenofibrate  (TRICOR ) 145 MG tablet   apixaban  (ELIQUIS ) 5 MG TABS tablet     Other   Hyperlipidemia LDL goal  <130   Relevant Medications   amLODipine  (NORVASC ) 10 MG tablet   fenofibrate  (TRICOR ) 145 MG tablet   apixaban  (ELIQUIS ) 5 MG TABS tablet   Other Relevant Orders   Lipid panel   Prediabetes - Primary   Relevant Orders   CBC with Differential/Platelet   CMP14+EGFR   Lipid panel   Bayer DCA Hb A1c Waived   Other Visit Diagnoses       Enlarged prostate       Relevant Medications   tamsulosin  (FLOMAX ) 0.4 MG CAPS capsule   Other Relevant Orders   PSA, total and free         Atrial fibrillation Asymptomatic with regular heart rate and rhythm. Awaiting follow-up post-MRI. - Continue current management. - Follow up with cardiologist on January 12th for MRI results.  Essential hypertension Blood pressure controlled at 131/79 mmHg on current regimen. - Continue current antihypertensive medications.  Hyperlipidemia Cholesterol levels to be checked to assess medication response.  Benign prostatic hyperplasia Symptoms managed with Flomax . Urinary flow satisfactory. - Continue Flomax .  Prediabetes No issues with blood sugar levels.  General health maintenance Flu shot administered. Colonoscopy pending scheduling. - Schedule colonoscopy for next year.          Follow up plan: Return in about 6 months (around 03/26/2025), or if symptoms worsen or fail to improve, for Physical exam.  Counseling provided for all of the vaccine components Orders Placed This Encounter  Procedures   CBC with Differential/Platelet   CMP14+EGFR   Lipid panel   PSA, total and free   Bayer DCA Hb A1c Waived    Fonda Levins, MD [facility] 09/25/2024, 8:19 AM

## 2024-10-07 ENCOUNTER — Inpatient Hospital Stay (HOSPITAL_BASED_OUTPATIENT_CLINIC_OR_DEPARTMENT_OTHER)
Admission: EM | Admit: 2024-10-07 | Discharge: 2024-10-10 | DRG: 193 | Disposition: A | Discharge status: Home Health | Attending: Internal Medicine | Admitting: Internal Medicine

## 2024-10-07 ENCOUNTER — Emergency Department (HOSPITAL_BASED_OUTPATIENT_CLINIC_OR_DEPARTMENT_OTHER): Admitting: Radiology

## 2024-10-07 ENCOUNTER — Emergency Department (HOSPITAL_BASED_OUTPATIENT_CLINIC_OR_DEPARTMENT_OTHER)

## 2024-10-07 ENCOUNTER — Other Ambulatory Visit: Payer: Self-pay

## 2024-10-07 ENCOUNTER — Encounter (HOSPITAL_BASED_OUTPATIENT_CLINIC_OR_DEPARTMENT_OTHER): Payer: Self-pay | Admitting: Emergency Medicine

## 2024-10-07 ENCOUNTER — Telehealth: Payer: Self-pay | Admitting: *Deleted

## 2024-10-07 DIAGNOSIS — E876 Hypokalemia: Secondary | ICD-10-CM | POA: Diagnosis not present

## 2024-10-07 DIAGNOSIS — I714 Abdominal aortic aneurysm, without rupture, unspecified: Secondary | ICD-10-CM | POA: Diagnosis present

## 2024-10-07 DIAGNOSIS — J101 Influenza due to other identified influenza virus with other respiratory manifestations: Principal | ICD-10-CM | POA: Diagnosis present

## 2024-10-07 DIAGNOSIS — Z880 Allergy status to penicillin: Secondary | ICD-10-CM

## 2024-10-07 DIAGNOSIS — Z83438 Family history of other disorder of lipoprotein metabolism and other lipidemia: Secondary | ICD-10-CM

## 2024-10-07 DIAGNOSIS — R4182 Altered mental status, unspecified: Secondary | ICD-10-CM

## 2024-10-07 DIAGNOSIS — I129 Hypertensive chronic kidney disease with stage 1 through stage 4 chronic kidney disease, or unspecified chronic kidney disease: Secondary | ICD-10-CM | POA: Diagnosis present

## 2024-10-07 DIAGNOSIS — Z7982 Long term (current) use of aspirin: Secondary | ICD-10-CM

## 2024-10-07 DIAGNOSIS — Z8249 Family history of ischemic heart disease and other diseases of the circulatory system: Secondary | ICD-10-CM

## 2024-10-07 DIAGNOSIS — G9341 Metabolic encephalopathy: Secondary | ICD-10-CM | POA: Diagnosis present

## 2024-10-07 DIAGNOSIS — N1831 Chronic kidney disease, stage 3a: Secondary | ICD-10-CM | POA: Diagnosis present

## 2024-10-07 DIAGNOSIS — I482 Chronic atrial fibrillation, unspecified: Secondary | ICD-10-CM | POA: Diagnosis present

## 2024-10-07 DIAGNOSIS — E860 Dehydration: Secondary | ICD-10-CM | POA: Diagnosis present

## 2024-10-07 DIAGNOSIS — Z7901 Long term (current) use of anticoagulants: Secondary | ICD-10-CM

## 2024-10-07 DIAGNOSIS — G9340 Encephalopathy, unspecified: Secondary | ICD-10-CM | POA: Diagnosis present

## 2024-10-07 DIAGNOSIS — E785 Hyperlipidemia, unspecified: Secondary | ICD-10-CM | POA: Diagnosis present

## 2024-10-07 DIAGNOSIS — N400 Enlarged prostate without lower urinary tract symptoms: Secondary | ICD-10-CM | POA: Diagnosis present

## 2024-10-07 DIAGNOSIS — Z79899 Other long term (current) drug therapy: Secondary | ICD-10-CM

## 2024-10-07 DIAGNOSIS — F05 Delirium due to known physiological condition: Secondary | ICD-10-CM | POA: Diagnosis not present

## 2024-10-07 DIAGNOSIS — F17220 Nicotine dependence, chewing tobacco, uncomplicated: Secondary | ICD-10-CM | POA: Diagnosis present

## 2024-10-07 DIAGNOSIS — Z833 Family history of diabetes mellitus: Secondary | ICD-10-CM

## 2024-10-07 DIAGNOSIS — I480 Paroxysmal atrial fibrillation: Secondary | ICD-10-CM | POA: Diagnosis present

## 2024-10-07 LAB — URINALYSIS, ROUTINE W REFLEX MICROSCOPIC
Bacteria, UA: NONE SEEN
Bilirubin Urine: NEGATIVE
Glucose, UA: NEGATIVE mg/dL
Hgb urine dipstick: NEGATIVE
Ketones, ur: NEGATIVE mg/dL
Leukocytes,Ua: NEGATIVE
Nitrite: NEGATIVE
Protein, ur: NEGATIVE mg/dL
Specific Gravity, Urine: 1.025 (ref 1.005–1.030)
pH: 6.5 (ref 5.0–8.0)

## 2024-10-07 LAB — COMPREHENSIVE METABOLIC PANEL WITH GFR
ALT: 23 U/L (ref 0–44)
AST: 30 U/L (ref 15–41)
Albumin: 4.4 g/dL (ref 3.5–5.0)
Alkaline Phosphatase: 31 U/L — ABNORMAL LOW (ref 38–126)
Anion gap: 10 (ref 5–15)
BUN: 27 mg/dL — ABNORMAL HIGH (ref 8–23)
CO2: 29 mmol/L (ref 22–32)
Calcium: 10.6 mg/dL — ABNORMAL HIGH (ref 8.9–10.3)
Chloride: 103 mmol/L (ref 98–111)
Creatinine, Ser: 1.28 mg/dL — ABNORMAL HIGH (ref 0.61–1.24)
GFR, Estimated: 58 mL/min — ABNORMAL LOW
Glucose, Bld: 123 mg/dL — ABNORMAL HIGH (ref 70–99)
Potassium: 3.6 mmol/L (ref 3.5–5.1)
Sodium: 142 mmol/L (ref 135–145)
Total Bilirubin: 2 mg/dL — ABNORMAL HIGH (ref 0.0–1.2)
Total Protein: 6.9 g/dL (ref 6.5–8.1)

## 2024-10-07 LAB — RESP PANEL BY RT-PCR (RSV, FLU A&B, COVID)  RVPGX2
Influenza A by PCR: POSITIVE — AB
Influenza B by PCR: NEGATIVE
Resp Syncytial Virus by PCR: NEGATIVE
SARS Coronavirus 2 by RT PCR: NEGATIVE

## 2024-10-07 LAB — CBC
HCT: 39.3 % (ref 39.0–52.0)
Hemoglobin: 13.2 g/dL (ref 13.0–17.0)
MCH: 29.1 pg (ref 26.0–34.0)
MCHC: 33.6 g/dL (ref 30.0–36.0)
MCV: 86.6 fL (ref 80.0–100.0)
Platelets: 177 K/uL (ref 150–400)
RBC: 4.54 MIL/uL (ref 4.22–5.81)
RDW: 12.8 % (ref 11.5–15.5)
WBC: 3.3 K/uL — ABNORMAL LOW (ref 4.0–10.5)
nRBC: 0 % (ref 0.0–0.2)

## 2024-10-07 MED ORDER — BENZONATATE 100 MG PO CAPS
100.0000 mg | ORAL_CAPSULE | Freq: Once | ORAL | Status: AC
  Administered 2024-10-07: 100 mg via ORAL
  Filled 2024-10-07: qty 1

## 2024-10-07 MED ORDER — SODIUM CHLORIDE 0.9 % IV BOLUS
1000.0000 mL | Freq: Once | INTRAVENOUS | Status: AC
  Administered 2024-10-07: 1000 mL via INTRAVENOUS

## 2024-10-07 MED ORDER — OSELTAMIVIR PHOSPHATE 75 MG PO CAPS
75.0000 mg | ORAL_CAPSULE | Freq: Once | ORAL | Status: AC
  Administered 2024-10-07: 75 mg via ORAL
  Filled 2024-10-07: qty 1

## 2024-10-07 MED ORDER — ACETAMINOPHEN 500 MG PO TABS
1000.0000 mg | ORAL_TABLET | Freq: Once | ORAL | Status: AC
  Administered 2024-10-07: 1000 mg via ORAL
  Filled 2024-10-07: qty 2
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
## 2024-10-07 NOTE — ED Notes (Signed)
 Pt ambulatory to and from restroom from room with personal footwear in place, steady gait noted.
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
## 2024-10-07 NOTE — ED Triage Notes (Signed)
 Reports increased fatigue and AMS x 1 week. Cough and congestrion  Thinks it is 43.

## 2024-10-07 NOTE — ED Notes (Signed)
 When assessing pt in room he was able to answer name, year and president but not birth date. In triage he did not know current year but daughter has since told him the year so now he knows.

## 2024-10-07 NOTE — ED Notes (Signed)
 Pt provided with blanket and beverage as ok'd by MD. Awaiting test results and further dispo.
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
## 2024-10-07 NOTE — Progress Notes (Signed)
 Plan of Care Note for accepted transfer   Patient: Tommy [name] MRN: [id number]   DOA: 10/07/2024  Facility requesting transfer: [facility]   Requesting Provider: Dr. Pamella   Reason for transfer: Acute encephalopathy, influenza A   Facility course: 73 yr old man with HTN, HLD, PAD, CKD 3A, AAA, and PAF on Eliquis  who presents with altered mental status and fatigue, also reporting recent cough and congestion.   He is found to have influenza A. There are no acute findings on head CT or CXR. SCr is at baseline, bilirubin is 2.0, and WBC 3.3.   He was treated with APAP, Tamiflu , and IVF.   Plan of care: The patient is accepted for admission to Med-surg  unit, at [facility].   Author: Evalene GORMAN Sprinkles, MD 10/07/2024  Check [email or web address] for on-call coverage.  Nursing staff, Please call [facility] Admits & Consults System-Wide number on Amion as soon as patient's arrival, so appropriate admitting provider can evaluate the pt.

## 2024-10-07 NOTE — ED Notes (Signed)
 Pt provided with beverage at request. Lighting adjusted in room, call bell in reach pt verbalizes understanding of use of same.

## 2024-10-07 NOTE — ED Notes (Signed)
 IVF completed, pt assisted to restroom with x assist, ambulatory. Awaiting admission.

## 2024-10-08 DIAGNOSIS — G9340 Encephalopathy, unspecified: Secondary | ICD-10-CM

## 2024-10-08 MED ORDER — FENOFIBRATE 160 MG PO TABS
160.0000 mg | ORAL_TABLET | Freq: Every day | ORAL | Status: DC
  Administered 2024-10-08 – 2024-10-10 (×3): 160 mg via ORAL
  Filled 2024-10-08 (×3): qty 1

## 2024-10-08 MED ORDER — GUAIFENESIN ER 600 MG PO TB12
600.0000 mg | ORAL_TABLET | Freq: Two times a day (BID) | ORAL | Status: DC
  Administered 2024-10-08 – 2024-10-10 (×4): 600 mg via ORAL
  Filled 2024-10-08 (×4): qty 1

## 2024-10-08 MED ORDER — CHLORTHALIDONE 25 MG PO TABS: 12.5000 mg | ORAL_TABLET | Freq: Every day | ORAL | Status: DC

## 2024-10-08 MED ORDER — LOSARTAN POTASSIUM 25 MG PO TABS
25.0000 mg | ORAL_TABLET | Freq: Every day | ORAL | Status: DC
  Administered 2024-10-08 – 2024-10-10 (×3): 25 mg via ORAL
  Filled 2024-10-08 (×3): qty 1

## 2024-10-08 MED ORDER — TAMSULOSIN HCL 0.4 MG PO CAPS
0.4000 mg | ORAL_CAPSULE | Freq: Every day | ORAL | Status: DC
  Administered 2024-10-08 – 2024-10-09 (×2): 0.4 mg via ORAL
  Filled 2024-10-08 (×2): qty 1

## 2024-10-08 MED ORDER — APIXABAN 5 MG PO TABS
5.0000 mg | ORAL_TABLET | Freq: Two times a day (BID) | ORAL | Status: DC
  Administered 2024-10-08 – 2024-10-10 (×5): 5 mg via ORAL
  Filled 2024-10-08 (×5): qty 1

## 2024-10-08 MED ORDER — POLYETHYLENE GLYCOL 3350 17 G PO PACK: 17.0000 g | PACK | Freq: Every day | ORAL | Status: DC | PRN

## 2024-10-08 MED ORDER — GUAIFENESIN-DM 100-10 MG/5ML PO SYRP
5.0000 mL | ORAL_SOLUTION | ORAL | Status: DC | PRN
  Administered 2024-10-08: 5 mL via ORAL
  Filled 2024-10-08: qty 10

## 2024-10-08 MED ORDER — SODIUM CHLORIDE 0.9 % IV SOLN: INTRAVENOUS | Status: AC

## 2024-10-08 MED ORDER — ACETAMINOPHEN 325 MG PO TABS
650.0000 mg | ORAL_TABLET | Freq: Four times a day (QID) | ORAL | Status: DC | PRN
  Administered 2024-10-08 – 2024-10-09 (×2): 650 mg via ORAL
  Filled 2024-10-08 (×2): qty 2

## 2024-10-08 MED ORDER — ONDANSETRON HCL 4 MG PO TABS: 4.0000 mg | ORAL_TABLET | Freq: Four times a day (QID) | ORAL | Status: DC | PRN

## 2024-10-08 MED ORDER — OSELTAMIVIR PHOSPHATE 30 MG PO CAPS
30.0000 mg | ORAL_CAPSULE | Freq: Two times a day (BID) | ORAL | Status: DC
  Administered 2024-10-08 – 2024-10-09 (×3): 30 mg via ORAL
  Filled 2024-10-08 (×3): qty 1

## 2024-10-08 MED ORDER — ACETAMINOPHEN 650 MG RE SUPP: 650.0000 mg | Freq: Four times a day (QID) | RECTAL | Status: DC | PRN

## 2024-10-08 MED ORDER — ONDANSETRON HCL 4 MG/2ML IJ SOLN: 4.0000 mg | Freq: Four times a day (QID) | INTRAMUSCULAR | Status: DC | PRN

## 2024-10-08 MED ORDER — AMLODIPINE BESYLATE 10 MG PO TABS
10.0000 mg | ORAL_TABLET | Freq: Every day | ORAL | Status: DC
  Administered 2024-10-08 – 2024-10-10 (×3): 10 mg via ORAL
  Filled 2024-10-08: qty 1
  Filled 2024-10-08: qty 2
  Filled 2024-10-08: qty 1

## 2024-10-08 NOTE — ED Notes (Signed)
 [facility] arrived to transport pt to [facility]. Report to transport team.

## 2024-10-08 NOTE — ED Notes (Signed)
 Some of pts meds we do have here at [facility]

## 2024-10-08 NOTE — ED Notes (Signed)
 Ed out and he was helped with urinal

## 2024-10-08 NOTE — ED Notes (Signed)
 Report to [facility] at [facility]

## 2024-10-08 NOTE — ED Notes (Signed)
 Pt given urinal for bedside use

## 2024-10-08 NOTE — ED Notes (Signed)
"   Introduced myself to pt got him some coffee pt states he was sleeping , pt slightly hard of hearing "

## 2024-10-08 NOTE — ED Notes (Signed)
 Responded to call bell, pt to and from restroom. Gait steady, denies other needs.

## 2024-10-08 NOTE — Plan of Care (Signed)
   Problem: Education: Goal: Knowledge of General Education information will improve Description Including pain rating scale, medication(s)/side effects and non-pharmacologic comfort measures Outcome: Progressing
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
## 2024-10-08 NOTE — ED Notes (Signed)
 [place] with [facility] called for transport

## 2024-10-08 NOTE — ED Notes (Addendum)
 Pt up on side of bed to void states feels a lot better  took meds with water , remains on 3 l O2  [place]

## 2024-10-08 NOTE — Telephone Encounter (Unsigned)
 Copied from CRM #[id number]. Topic: General - Other >> Oct 08, 2024  9:50 AM Zebedee SAUNDERS wrote: Reason for CRM: Pt's daughter [place],Christie [phone] calling to inform PCP that pt is in hospital.

## 2024-10-09 ENCOUNTER — Ambulatory Visit

## 2024-10-09 DIAGNOSIS — G9341 Metabolic encephalopathy: Secondary | ICD-10-CM | POA: Diagnosis present

## 2024-10-09 DIAGNOSIS — N1831 Chronic kidney disease, stage 3a: Secondary | ICD-10-CM | POA: Diagnosis present

## 2024-10-09 DIAGNOSIS — E860 Dehydration: Secondary | ICD-10-CM | POA: Diagnosis present

## 2024-10-09 DIAGNOSIS — Z833 Family history of diabetes mellitus: Secondary | ICD-10-CM | POA: Diagnosis not present

## 2024-10-09 DIAGNOSIS — G9340 Encephalopathy, unspecified: Secondary | ICD-10-CM | POA: Diagnosis not present

## 2024-10-09 DIAGNOSIS — Z7901 Long term (current) use of anticoagulants: Secondary | ICD-10-CM | POA: Diagnosis not present

## 2024-10-09 DIAGNOSIS — N400 Enlarged prostate without lower urinary tract symptoms: Secondary | ICD-10-CM | POA: Diagnosis present

## 2024-10-09 DIAGNOSIS — I129 Hypertensive chronic kidney disease with stage 1 through stage 4 chronic kidney disease, or unspecified chronic kidney disease: Secondary | ICD-10-CM | POA: Diagnosis present

## 2024-10-09 DIAGNOSIS — E785 Hyperlipidemia, unspecified: Secondary | ICD-10-CM | POA: Diagnosis present

## 2024-10-09 DIAGNOSIS — Z7982 Long term (current) use of aspirin: Secondary | ICD-10-CM | POA: Diagnosis not present

## 2024-10-09 DIAGNOSIS — Z8249 Family history of ischemic heart disease and other diseases of the circulatory system: Secondary | ICD-10-CM | POA: Diagnosis not present

## 2024-10-09 DIAGNOSIS — I480 Paroxysmal atrial fibrillation: Secondary | ICD-10-CM | POA: Diagnosis present

## 2024-10-09 DIAGNOSIS — Z880 Allergy status to penicillin: Secondary | ICD-10-CM | POA: Diagnosis not present

## 2024-10-09 DIAGNOSIS — J101 Influenza due to other identified influenza virus with other respiratory manifestations: Secondary | ICD-10-CM | POA: Diagnosis present

## 2024-10-09 DIAGNOSIS — Z83438 Family history of other disorder of lipoprotein metabolism and other lipidemia: Secondary | ICD-10-CM | POA: Diagnosis not present

## 2024-10-09 DIAGNOSIS — E876 Hypokalemia: Secondary | ICD-10-CM | POA: Diagnosis not present

## 2024-10-09 DIAGNOSIS — Z79899 Other long term (current) drug therapy: Secondary | ICD-10-CM | POA: Diagnosis not present

## 2024-10-09 DIAGNOSIS — I714 Abdominal aortic aneurysm, without rupture, unspecified: Secondary | ICD-10-CM | POA: Diagnosis present

## 2024-10-09 DIAGNOSIS — F17220 Nicotine dependence, chewing tobacco, uncomplicated: Secondary | ICD-10-CM | POA: Diagnosis present

## 2024-10-09 DIAGNOSIS — I482 Chronic atrial fibrillation, unspecified: Secondary | ICD-10-CM | POA: Diagnosis present

## 2024-10-09 DIAGNOSIS — F05 Delirium due to known physiological condition: Secondary | ICD-10-CM | POA: Diagnosis not present

## 2024-10-09 LAB — CBC
HCT: 35.3 % — ABNORMAL LOW (ref 39.0–52.0)
Hemoglobin: 12.3 g/dL — ABNORMAL LOW (ref 13.0–17.0)
MCH: 29.3 pg (ref 26.0–34.0)
MCHC: 34.8 g/dL (ref 30.0–36.0)
MCV: 84 fL (ref 80.0–100.0)
Platelets: 153 K/uL (ref 150–400)
RBC: 4.2 MIL/uL — ABNORMAL LOW (ref 4.22–5.81)
RDW: 12.5 % (ref 11.5–15.5)
WBC: 2.5 K/uL — ABNORMAL LOW (ref 4.0–10.5)
nRBC: 0 % (ref 0.0–0.2)

## 2024-10-09 LAB — BASIC METABOLIC PANEL WITH GFR
Anion gap: 11 (ref 5–15)
BUN: 16 mg/dL (ref 8–23)
CO2: 26 mmol/L (ref 22–32)
Calcium: 10 mg/dL (ref 8.9–10.3)
Chloride: 102 mmol/L (ref 98–111)
Creatinine, Ser: 1.03 mg/dL (ref 0.61–1.24)
GFR, Estimated: >60 mL/min
Glucose, Bld: 86 mg/dL (ref 70–99)
Potassium: 3.3 mmol/L — ABNORMAL LOW (ref 3.5–5.1)
Sodium: 139 mmol/L (ref 135–145)

## 2024-10-09 MED ORDER — POTASSIUM CHLORIDE 20 MEQ PO PACK
60.0000 meq | PACK | Freq: Once | ORAL | Status: AC
  Administered 2024-10-09: 60 meq via ORAL
  Filled 2024-10-09: qty 3

## 2024-10-09 MED ORDER — MELATONIN 5 MG PO TABS
5.0000 mg | ORAL_TABLET | Freq: Every evening | ORAL | Status: DC | PRN
  Administered 2024-10-09: 5 mg via ORAL
  Filled 2024-10-09: qty 1

## 2024-10-09 MED ORDER — OSELTAMIVIR PHOSPHATE 75 MG PO CAPS
75.0000 mg | ORAL_CAPSULE | Freq: Two times a day (BID) | ORAL | Status: DC
  Administered 2024-10-09 – 2024-10-10 (×2): 75 mg via ORAL
  Filled 2024-10-09 (×2): qty 1

## 2024-10-09 NOTE — Plan of Care (Signed)
  Problem: Education: Goal: Knowledge of General Education information will improve Description: Including pain rating scale, medication(s)/side effects and non-pharmacologic comfort measures Outcome: Progressing   Problem: Clinical Measurements: Goal: Ability to maintain clinical measurements within normal limits will improve Outcome: Progressing Goal: Respiratory complications will improve Outcome: Progressing   

## 2024-10-09 NOTE — Evaluation (Signed)
 Physical Therapy Evaluation Patient Details Name: Tommy [name] MRN: [id number] DOB: Jun 23, 1951 Today's Date: 10/09/2024  History of Present Illness  Pt is a 37 y.[name]. male who presented 10/07/24 from [facility] due to increase in confusion. Pt found to have acute metabolic encephalopathy due to Influenza A and associated dehydration. PMH: CKD, afib, HTN, HLD, AAA  Clinical Impression  Pt admitted with above diagnosis. Pt is oriented to self, location and to month/year, he is able to follow commands. Pt ambulated 250' without an assistive device but with intermittent reaching for handrail, he'd benefit from trial of a cane. At baseline he ambulates without an assistive device. Pt currently with functional limitations due to the deficits listed below (see PT Problem List). Pt will benefit from acute skilled PT to increase their independence and safety with mobility to allow discharge.           If plan is discharge home, recommend the following: Assistance with cooking/housework;Assist for transportation   Can travel by private vehicle        Equipment Recommendations None recommended by PT  Recommendations for Other Services       Functional Status Assessment Patient has had a recent decline in their functional status and demonstrates the ability to make significant improvements in function in a reasonable and predictable amount of time.     Precautions / Restrictions Precautions Precautions: Fall Recall of Precautions/Restrictions: Intact Restrictions Weight Bearing Restrictions Per Provider Order: No      Mobility  Bed Mobility               General bed mobility comments: up in recliner    Transfers Overall transfer level: Independent Equipment used: None Transfers: Sit to/from Stand Sit to Stand: Independent                Ambulation/Gait Ambulation/Gait assistance: Supervision Gait Distance (Feet): 250 Feet Assistive device: None Gait  Pattern/deviations: Step-through pattern, Decreased stride length, Drifts right/left Gait velocity: WFL     General Gait Details: intermittently reached for handrail in hall, no loss of balance when ambulating without handrail but noted some drifting L/R  Stairs            Wheelchair Mobility     Tilt Bed    Modified Rankin (Stroke Patients Only)       Balance Overall balance assessment: Needs assistance Sitting-balance support: Feet supported, No upper extremity supported Sitting balance-Leahy Scale: Good       Standing balance-Leahy Scale: Good                               Pertinent Vitals/Pain Pain Assessment Pain Assessment: No/denies pain    Home Living Family/patient expects to be discharged to:: Private residence Living Arrangements: Spouse/significant other Available Help at Discharge: Available PRN/intermittently Type of Home: House Home Access: Stairs to enter Entrance Stairs-Rails: Left;Right;Can reach both Entrance Stairs-Number of Steps: 5   Home Layout: One level Home Equipment: Agricultural Consultant (2 wheels);Cane - single point Additional Comments: confirmed with daughter in session that he works part time at [employer] and was driving. Takes care of wife that has dementia    Prior Function Prior Level of Function : Driving;Working/employed             Mobility Comments: indep ADLs Comments: indep     Extremity/Trunk Assessment   Upper Extremity Assessment Upper Extremity Assessment: Defer to OT evaluation    Lower Extremity Assessment  Lower Extremity Assessment: Overall WFL for tasks assessed    Cervical / Trunk Assessment Cervical / Trunk Assessment: Normal  Communication   Communication Communication: No apparent difficulties Factors Affecting Communication: Hearing impaired    Cognition Arousal: Alert Behavior During Therapy: WFL for tasks assessed/performed   PT - Cognitive impairments: No apparent  impairments                       PT - Cognition Comments: oriented to self,  location and to month/year Following commands: Intact       Cueing Cueing Techniques: Verbal cues     General Comments General comments (skin integrity, edema, etc.): noted skin inditation marks from glasses    Exercises     Assessment/Plan    PT Assessment Patient needs continued PT services  PT Problem List Decreased balance       PT Treatment Interventions Gait training;Therapeutic activities;Therapeutic exercise;Patient/family education;DME instruction    PT Goals (Current goals can be found in the Care Plan section)  Acute Rehab PT Goals 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 Goal Formulation: With patient Time For Goal Achievement: 10/23/24 Potential to Achieve Goals: Good    Frequency Min 3X/week     Co-evaluation               AM-PAC PT 6 Clicks Mobility  Outcome Measure Help needed turning from your back to your side while in a flat bed without using bedrails?: None Help needed moving from lying on your back to sitting on the side of a flat bed without using bedrails?: None Help needed moving to and from a bed to a chair (including a wheelchair)?: None Help needed standing up from a chair using your arms (e.g., wheelchair or bedside chair)?: None Help needed to walk in hospital room?: None Help needed climbing 3-5 steps with a railing? : A Little 6 Click Score: 23    End of Session Equipment Utilized During Treatment: Gait belt Activity Tolerance: Patient tolerated treatment well Patient left: in chair;with chair alarm set;with call bell/phone within reach Nurse Communication: Mobility status PT Visit Diagnosis: Unsteadiness on feet (R26.81);Difficulty in walking, not elsewhere classified (R26.2)    Time: 8865-8852 PT Time Calculation (min) (ACUTE ONLY): 13 min   Charges:   PT Evaluation $PT Eval Moderate Complexity: 1 Mod   PT General  Charges $$ ACUTE PT VISIT: 1 Visit        Sylvan Delon Copp PT 10/09/2024  Acute Rehabilitation Services  Office [phone]
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
## 2024-10-09 NOTE — Evaluation (Signed)
 Occupational Therapy Evaluation Patient Details Name: Tommy [name] MRN: [id number] DOB: [birth year] Today's Date: 10/09/2024   History of Present Illness   Pt is a 27 y.[name]. male who presented 10/07/24 from [facility] due to increase in confusion. Pt found to have acute metabolic encephalopathy due to Influenza A and associated dehydration. PMH: CKD, afib, HTN, HLD, AAA     Clinical Impressions Pt reported at PLOF they live with their wife (who has dementia) and was not using any DME. At baseline he was driving and working part time at [employer] (information clarified with daughter via phone). At this time he was was able to correctly report date but was reporting as if he currently at place to work on my mental being. He could not report where he was but once oriented was then able to report later in session he is at [facility]. Pt through session required CGA and cues on how to sequence and problem solve through ADLs. For example, was unaware he currently has an IV and wanted to don a shirt and needed assist with sequence belt through loop of pants. Patient will benefit from continued inpatient follow up therapy, <3 hours/day.         If plan is discharge home, recommend the following:   A little help with walking and/or transfers;A little help with bathing/dressing/bathroom;Assistance with cooking/housework;Direct supervision/assist for medications management;Direct supervision/assist for financial management;Assist for transportation;Supervision due to cognitive status;Help with stairs or ramp for entrance     Functional Status Assessment   Patient has had a recent decline in their functional status and demonstrates the ability to make significant improvements in function in a reasonable and predictable amount of time.     Equipment Recommendations   Tub/shower seat     Recommendations for Other Services         Precautions/Restrictions   Precautions Precautions:  Fall Restrictions Weight Bearing Restrictions Per Provider Order: No     Mobility Bed Mobility Overal bed mobility: Modified Independent             General bed mobility comments: cues as pt unaware pushing onto IV line    Transfers Overall transfer level: Needs assistance Equipment used: None (pt was pushing IV pole at times) Transfers: Sit to/from Stand Sit to Stand: Contact guard assist           General transfer comment: cues on positioning      Balance Overall balance assessment: Needs assistance Sitting-balance support: Feet supported Sitting balance-Leahy Scale: Good     Standing balance support: No upper extremity supported Standing balance-Leahy Scale: Fair                             ADL either performed or assessed with clinical judgement   ADL Overall ADL's : Needs assistance/impaired Eating/Feeding: Independent;Sitting   Grooming: Wash/dry hands;Wash/dry face;Oral care;Standing;Contact guard assist   Upper Body Bathing: Contact guard assist;Sitting   Lower Body Bathing: Contact guard assist;Sit to/from stand;Minimal assistance   Upper Body Dressing : Contact guard assist;Sitting   Lower Body Dressing: Contact guard assist;Minimal assistance Lower Body Dressing Details (indicate cue type and reason): needed assist to problem solve on how to don belt Toilet Transfer: Contact guard assist;Cueing for safety;Cueing for sequencing   Toileting- Clothing Manipulation and Hygiene: Contact guard assist;Sit to/from stand       Functional mobility during ADLs: Contact guard assist;Cueing for safety;Cueing for sequencing;Rolling walker (2 wheels)  Vision Baseline Vision/History: 1 Wears glasses Ability to See in Adequate Light: 0 Adequate Patient Visual Report: No change from baseline Vision Assessment?: No apparent visual deficits     Perception Perception: Within Functional Limits       Praxis Praxis: WFL        Pertinent Vitals/Pain Pain Assessment Pain Assessment: No/denies pain     Extremity/Trunk Assessment Upper Extremity Assessment Upper Extremity Assessment: Overall WFL for tasks assessed (R hand decrease in FM secondary to digits  3 and 4)   Lower Extremity Assessment Lower Extremity Assessment: Defer to PT evaluation   Cervical / Trunk Assessment Cervical / Trunk Assessment: Normal   Communication Communication Communication: No apparent difficulties Factors Affecting Communication: Hearing impaired   Cognition Arousal: Alert Behavior During Therapy: WFL for tasks assessed/performed Cognition: Cognition impaired   Orientation impairments: Situation, Place Awareness: Intellectual awareness impaired, Online awareness impaired Memory impairment (select all impairments): Working civil service fast streamer, Copywriter, advertising, Engineer, structural memory Attention impairment (select first level of impairment): Alternating attention, Divided attention Executive functioning impairment (select all impairments): Sequencing, Reasoning, Problem solving OT - Cognition Comments: Pt noted several times in session difficulties how to problem solve through tasks but could complete basic tasks like brush your teeth with fair quality                 Following commands: Intact       Cueing  General Comments   Cueing Techniques: Verbal cues  noted skin inditation marks from glasses   Exercises     Shoulder Instructions      Home Living Family/patient expects to be discharged to:: Private residence Living Arrangements: Spouse/significant other Available Help at Discharge: Available PRN/intermittently Type of Home: House Home Access: Stairs to enter [employer] of Steps: 3 Entrance Stairs-Rails: Left;Right;Can reach both Home Layout: One level     Bathroom Shower/Tub: Producer, Television/film/video: Standard Bathroom Accessibility: Yes How Accessible:  Accessible via walker Home Equipment: Rolling Walker (2 wheels);Cane - single point   Additional Comments: confirmed with daughter in session that he works part time at [employer] and was driving. Takes care of wife that has dementia      Prior Functioning/Environment Prior Level of Function : Driving;Working/employed             Mobility Comments: indep ADLs Comments: indep    OT Problem List: Decreased activity tolerance;Decreased safety awareness;Decreased strength;Decreased cognition;Decreased knowledge of use of DME or AE   OT Treatment/Interventions: Self-care/ADL training;Therapeutic exercise;DME and/or AE instruction;Therapeutic activities;Cognitive remediation/compensation;Patient/family education      OT Goals(Current goals can be found in the care plan section)   Acute Rehab OT Goals Patient Stated Goal: to go home OT Goal Formulation: With patient Time For Goal Achievement: 10/23/24 Potential to Achieve Goals: Fair   OT Frequency:  Min 2X/week    Co-evaluation              AM-PAC OT 6 Clicks Daily Activity     Outcome Measure Help from another person eating meals?: None Help from another person taking care of personal grooming?: None Help from another person toileting, which includes using toliet, bedpan, or urinal?: A Little Help from another person bathing (including washing, rinsing, drying)?: A Little Help from another person to put on and taking off regular upper body clothing?: None Help from another person to put on and taking off regular lower body clothing?: A Little 6 Click Score: 21   End of Session Equipment Utilized During  Treatment: Gait belt Nurse Communication: Mobility status  Activity Tolerance: Patient tolerated treatment well Patient left: in chair;with call bell/phone within reach;with chair alarm set  OT Visit Diagnosis: Unsteadiness on feet (R26.81);Other abnormalities of gait and mobility (R26.89);Repeated falls  (R29.6);Muscle weakness (generalized) (M62.81)                Time: 9161-9080 OT Time Calculation (min): 41 min Charges:  OT General Charges $OT Visit: 1 Visit OT Evaluation $OT Eval Low Complexity: 1 Low OT Treatments $Self Care/Home Management : 23-37 mins  Warrick POUR OTR/L  Acute Rehab Services  [phone] office number   Warrick Berber 10/09/2024, 9:36 AM

## 2024-10-09 NOTE — Plan of Care (Signed)
   Problem: Clinical Measurements: Goal: Respiratory complications will improve Outcome: Progressing

## 2024-10-10 DIAGNOSIS — G9340 Encephalopathy, unspecified: Secondary | ICD-10-CM | POA: Diagnosis not present

## 2024-10-10 LAB — BASIC METABOLIC PANEL WITH GFR
Anion gap: 11 (ref 5–15)
BUN: 22 mg/dL (ref 8–23)
CO2: 23 mmol/L (ref 22–32)
Calcium: 10.1 mg/dL (ref 8.9–10.3)
Chloride: 107 mmol/L (ref 98–111)
Creatinine, Ser: 0.99 mg/dL (ref 0.61–1.24)
GFR, Estimated: >60 mL/min
Glucose, Bld: 87 mg/dL (ref 70–99)
Potassium: 3.5 mmol/L (ref 3.5–5.1)
Sodium: 141 mmol/L (ref 135–145)

## 2024-10-10 LAB — MAGNESIUM: Magnesium: 2.1 mg/dL (ref 1.7–2.4)

## 2024-10-10 LAB — PHOSPHORUS: Phosphorus: 2.1 mg/dL — ABNORMAL LOW (ref 2.5–4.6)

## 2024-10-10 MED ORDER — POTASSIUM PHOSPHATES 15 MMOLE/5ML IV SOLN
15.0000 mmol | Freq: Once | INTRAVENOUS | Status: AC
  Administered 2024-10-10: 15 mmol via INTRAVENOUS
  Filled 2024-10-10: qty 5

## 2024-10-10 MED ORDER — OSELTAMIVIR PHOSPHATE 75 MG PO CAPS: 75.0000 mg | ORAL_CAPSULE | Freq: Two times a day (BID) | ORAL | 0 refills | Status: AC

## 2024-10-10 MED ORDER — MELATONIN 5 MG PO TABS: 5.0000 mg | ORAL_TABLET | Freq: Every day | ORAL | Status: DC

## 2024-10-10 NOTE — Progress Notes (Signed)
 Reviewed written d/c instructions w pt's daughter America), all questions answered and she verbalized understanding. D/C w mask via w/c w all belongings in stable condition.

## 2024-10-10 NOTE — Discharge Instructions (Signed)
 Recommend remain out of work for the next 2 weeks.  No further driving/operating vehicles/equipment until confusion improves/resolves.  If no improvement of confusion, recommend outpatient follow-up with PCP with referral to neurology versus neuropsychology for dementia testing.

## 2024-10-10 NOTE — Progress Notes (Signed)
 Pt observed to be confused with poor safety awareness. Unable to be redirected; impulsive behavior noted with multiple attempts to leave room. Order or non-violent restraints implemented but found to be ineffective. Therapeutic communication attempted with frequent verbal reassurance. Safety measures continued, including close monitoring pt room close to nursing station, fall mats in place, bed in lowest position w/call bed in reach. Will continue to reassess and notify provider as needed.
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
## 2024-10-10 NOTE — Plan of Care (Signed)

## 2024-10-10 NOTE — Progress Notes (Signed)
 PT Cancellation Note  Patient Details Name: Tommy [name] MRN: [id number] DOB: 12/07/1950   Cancelled Treatment:    Reason Eval/Treat Not Completed: Other (comment). Per RN pt very confused, difficulty following commands. PT deferred at this time   [facility] 10/10/2024, 12:20 PM

## 2024-10-10 NOTE — TOC Transition Note (Signed)
 Transition of Care [facility]) - Discharge Note   Patient Details  Name: Tommy [name] MRN: [id number] Date of Birth: [birth year]  Transition of Care [facility]) CM/SW Contact:  Jon ONEIDA Anon, RN Phone Number: 10/10/2024, 2:07 PM   Clinical Narrative:    Pt will discharge home with home health services through [facility] for PT/OT/RN/HHAide. Pt daughter agreeable to DC plan. Pt recommended for tub bench. Discussed with pt daughter about insurance not covering this equipment and she states she will pick one up once stores are open on tomorrow. Pt family will provide transportation at discharge. No further ICM needs identified at this time. ICM will sign off.    The following HH agency was presented to the pt:  [facility])  Accepted -- [address], [place] [address] [phone] --   Barriers to Discharge: Barriers Resolved   Patient Goals and CMS Choice Patient states their goals for this hospitalization and ongoing recovery are:: Return home with [facility] services CMS Medicare.gov Compare Post Acute Care list provided to:: Patient Represenative (must comment) (Pt daughter [name]) Choice offered to / list presented to : Adult Children [place] ownership interest in [facility].provided to:: Adult Children    Discharge Placement                  Name of family member notified: [name]  Daughter, Emergency Contact  [phone] Patient and family notified of of transfer: 10/10/24  Discharge Plan and Services Additional resources added to the After Visit Summary for                  DME Arranged: N/A DME Agency: NA       HH Arranged: RN, PT, OT, Nurse's Aide HH Agency: [facility] Date [facility] Agency Contacted: 10/10/24 Time HH Agency Contacted: 1405 Representative spoke with at [facility] Agency: Accepted through the [employer]  Social Drivers of Health (SDOH) Interventions SDOH Screenings    Food Insecurity: No Food Insecurity (10/08/2024)  Housing: Unknown (10/09/2024)  Transportation Needs: No Transportation Needs (10/08/2024)  Utilities: Not At Risk (10/08/2024)  Alcohol Screen: Low Risk (06/17/2024)  Depression (PHQ2-9): Low Risk (09/25/2024)  Financial Resource Strain: Low Risk (06/17/2024)  Physical Activity: Insufficiently Active (06/17/2024)  Social Connections: Socially Integrated (10/08/2024)  Stress: No Stress Concern Present (06/17/2024)  Tobacco Use: High Risk (10/07/2024)  Health Literacy: Adequate Health Literacy (06/17/2024)     Readmission Risk Interventions     No data to display
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
## 2024-10-14 ENCOUNTER — Telehealth: Payer: Self-pay | Admitting: *Deleted

## 2024-10-14 ENCOUNTER — Telehealth: Payer: Self-pay

## 2024-10-14 NOTE — Telephone Encounter (Signed)
"  Verbal given  "

## 2024-10-14 NOTE — Telephone Encounter (Signed)
 Copied from CRM [id number]. Topic: Clinical - Home Health Verbal Orders >> Oct 14, 2024 12:55 PM Roselie BROCKS wrote: Caller/Agency: [facility]  Callback Number: [phone] Harlene, confidential voicemail Service Requested: Physical Therapy Frequency: PT once week for 4 weeks,  Any new concerns about the patient? No

## 2024-10-14 NOTE — Telephone Encounter (Signed)
 TC to Sharlet, nurse & Harlene, PT w/ [facility] VO given for frequency

## 2024-10-14 NOTE — Telephone Encounter (Signed)
 Copied from CRM #[id number]. Topic: Clinical - Home Health Verbal Orders >> Oct 14, 2024  2:46 PM Graeme ORN wrote: Caller/Agency: [facility] Callback Number: [phone] Service Requested: Occupational Therapy Frequency: 1 week 4 Any new concerns about the patient? No

## 2024-10-14 NOTE — Transitions of Care (Post Inpatient/ED Visit) (Signed)
" ° °  10/14/2024  Name: Tommy [name] MRN: [id number] DOB: 06-09-1951  Today's TOC FU Call Status: Today's TOC FU Call Status:: Successful TOC FU Call Completed TOC FU Call Complete Date: 10/14/24  Patient's Name and Date of Birth confirmed. Name, DOB  Transition Care Management Follow-up Telephone Call Date of Discharge: 10/10/24 Discharge Facility: [facility]) Type of Discharge: Inpatient Admission Primary Inpatient Discharge Diagnosis:: influenza How have you been since you were released from the hospital?: Better Any questions or concerns?: No  Items Reviewed: Did you receive and understand the discharge instructions provided?: Yes Medications obtained,verified, and reconciled?: Yes (Medications Reviewed) Any new allergies since your discharge?: No Dietary orders reviewed?: Yes Do you have support at home?: Yes People in Home [RPT]: spouse, child(ren), adult  Medications Reviewed Today: Medications Reviewed Today     Reviewed by Emmitt Pan, LPN (Licensed Practical Nurse) on 10/14/24 at 1103  Med List Status: <None>   Medication Order Taking? Sig Documenting Provider Last Dose Status Informant  amLODipine  (NORVASC ) 10 MG tablet [id number] Yes Take 1 tablet (10 mg total) by mouth daily. [name], Fonda DELENA, MD  Active Self  apixaban  (ELIQUIS ) 5 MG TABS tablet [id number] Yes Take 1 tablet (5 mg total) by mouth 2 (two) times daily. [name], Fonda DELENA, MD  Active Self  aspirin  EC 81 MG tablet [id number] Yes Take 81 mg by mouth daily. Swallow whole. [provider]  Active Self           Med Note LEONARDO, [facility]   Tue Oct 08, 2024 12:27 PM) Pt is out of pills  chlorthalidone  (HYGROTON ) 25 MG tablet [id number] Yes Take 0.5 tablets (12.5 mg total) by mouth daily. Leverne Charlies Helling, PA-C  Active Self  fenofibrate  (TRICOR ) 145 MG tablet [id number] Yes Take 1 tablet (145 mg total) by mouth daily. [name], Fonda DELENA, MD  Active Self  losartan  (COZAAR ) 25 MG tablet  [id number] Yes TAKE 1 TABLET EVERY DAY (NEED MD APPOINTMENT FOR REFILLS) Lavona Agent, MD  Active Self  rosuvastatin  (CRESTOR ) 10 MG tablet [id number] Yes TAKE 1 TABLET EVERY EVENING [name], Fonda DELENA, MD  Active Self  tamsulosin  (FLOMAX ) 0.4 MG CAPS capsule [id number] Yes Take 1 capsule (0.4 mg total) by mouth daily after supper. [name], Fonda DELENA, MD  Active Self            Home Care and Equipment/Supplies: Were Home Health Services Ordered?: Yes Name of Home Health Agency:: [facility] Has Agency set up a time to come to your home?: Yes First Home Health Visit Date: 10/13/24 Any new equipment or medical supplies ordered?: NA  Functional Questionnaire: Do you need assistance with bathing/showering or dressing?: No Do you need assistance with meal preparation?: Yes Do you need assistance with eating?: No Do you have difficulty maintaining continence: No Do you need assistance with getting out of bed/getting out of a chair/moving?: No Do you have difficulty managing or taking your medications?: No  Follow up appointments reviewed: PCP Follow-up appointment confirmed?: Yes Date of PCP follow-up appointment?: 10/24/24 Follow-up Provider: [facility] Follow-up appointment confirmed?: Yes Date of Specialist follow-up appointment?: 10/21/24 Follow-Up Specialty Provider:: vascular Do you need transportation to your follow-up appointment?: No Do you understand care options if your condition(s) worsen?: Yes-patient verbalized understanding    SIGNATURE Pan Emmitt, LPN [facility] Nurse Health Advisor Direct Dial [phone]  "

## 2024-10-14 NOTE — Telephone Encounter (Signed)
 Copied from CRM [id number]. Topic: Clinical - Home Health Verbal Orders >> Oct 14, 2024 11:23 AM Chiquita SQUIBB wrote: Caller/Agency: [facility] with [facility]  Callback Number: [phone] Service Requested: Skilled Nursing Frequency: 1 week 4, 1 month 1, 2 PRN- Medication management  Any new concerns about the patient? No

## 2024-10-21 ENCOUNTER — Encounter: Payer: Self-pay | Admitting: Surgery

## 2024-10-21 ENCOUNTER — Other Ambulatory Visit: Payer: Self-pay

## 2024-10-21 ENCOUNTER — Ambulatory Visit: Attending: Surgery | Admitting: Surgery

## 2024-10-21 VITALS — BP 134/82 | HR 65 | Temp 98.3°F | Resp 22 | Ht 71.0 in | Wt 165.8 lb

## 2024-10-21 DIAGNOSIS — N281 Cyst of kidney, acquired: Secondary | ICD-10-CM

## 2024-10-21 DIAGNOSIS — I7143 Infrarenal abdominal aortic aneurysm, without rupture: Secondary | ICD-10-CM | POA: Diagnosis not present

## 2024-10-21 NOTE — Progress Notes (Signed)
 "                                    [facility] [place]  Patient name: Tommy [name] MRN: [id number] DOB: Dec 13, 1950 Sex: male   REASON FOR VISIT:    Follow up  HISOTRY OF PRESENT ILLNESS:   Tommy [name] is a 74 y.o. male who is status post left SFA stent and October 2009 for claudication.  We are also following him for an abdominal aortic aneurysm.  He denies any abdominal pain.  He denies claudication symptoms.  He is back today with a CT scan   The patient is a former smoker.  He is on Eliquis  for atrial fibrillation.  He takes a statin for hypercholesterolemia.  He is medically managed for hypertension with an ARB.    PAST MEDICAL HISTORY:   Past Medical History:  Diagnosis Date   Allergy    BPH with obstruction/lower urinary tract symptoms    hyperplasia   ED (erectile dysfunction)    arterial insuff.   History of DVT of lower extremity    History of gastric ulcer    Hyperlipidemia    Hypertension    Peripheral vascular disease    Prostate cancer (HCC) DX 01/01/15--  urologist-  dr watt   Stage T1c, Gleason 3+4, PSA 5.73, vol 31ml   Wears glasses      FAMILY HISTORY:   Family History  Problem Relation Age of Onset   Heart disease Mother    Cancer Mother    Deep vein thrombosis Mother    Diabetes Mother    Hyperlipidemia Mother    Hypertension Mother    Heart attack Mother    Peripheral vascular disease Mother    Cancer Father    Colon cancer Neg Hx     SOCIAL HISTORY:   Social History   Tobacco Use   Smoking status: Former    Current packs/day: 0.00    Average packs/day: 1 pack/day for 15.0 years (15.0 ttl pk-yrs)    Types: Cigarettes    Start date: 09/09/2010    Quit date: 04/21/2011    Years since quitting: 13.5   Smokeless tobacco: Current    Types: Snuff   Tobacco comments:    occasional  someday dips tobacco  Substance Use Topics   Alcohol use: No    Alcohol/week: 0.0 standard drinks of alcohol     ALLERGIES:    Allergies[1]   CURRENT MEDICATIONS:   Current Outpatient Medications  Medication Sig Dispense Refill   amLODipine  (NORVASC ) 10 MG tablet Take 1 tablet (10 mg total) by mouth daily. 90 tablet 3   apixaban  (ELIQUIS ) 5 MG TABS tablet Take 1 tablet (5 mg total) by mouth 2 (two) times daily. 180 tablet 3   aspirin  EC 81 MG tablet Take 81 mg by mouth daily. Swallow whole.     chlorthalidone  (HYGROTON ) 25 MG tablet Take 0.5 tablets (12.5 mg total) by mouth daily. 45 tablet 3   fenofibrate  (TRICOR ) 145 MG tablet Take 1 tablet (145 mg total) by mouth daily. 90 tablet 3   losartan  (COZAAR ) 25 MG tablet TAKE 1 TABLET EVERY DAY (NEED MD APPOINTMENT FOR REFILLS) 15 tablet 0   rosuvastatin  (CRESTOR ) 10 MG tablet TAKE 1 TABLET EVERY EVENING 90 tablet 3   tamsulosin  (FLOMAX ) 0.4 MG CAPS capsule Take 1 capsule (0.4 mg total) by mouth daily after  supper. 90 capsule 3   No current facility-administered medications for this visit.    REVIEW OF SYSTEMS:   [X]  denotes positive finding, [ ]  denotes negative finding Cardiac  Comments:  Chest pain or chest pressure:    Shortness of breath upon exertion:    Short of breath when lying flat:    Irregular heart rhythm:        Vascular    Pain in calf, thigh, or hip brought on by ambulation:    Pain in feet at night that wakes you up from your sleep:     Blood clot in your veins:    Leg swelling:         Pulmonary    Oxygen at home:    Productive cough:     Wheezing:         Neurologic    Sudden weakness in arms or legs:     Sudden numbness in arms or legs:     Sudden onset of difficulty speaking or slurred speech:    Temporary loss of vision in one eye:     Problems with dizziness:         Gastrointestinal    Blood in stool:     Vomited blood:         Genitourinary    Burning when urinating:     Blood in urine:        Psychiatric    Major depression:         Hematologic    Bleeding problems:    Problems with blood clotting too  easily:        Skin    Rashes or ulcers:        Constitutional    Fever or chills:      PHYSICAL EXAM:   Vitals:   10/21/24 1441  BP: 134/82  Pulse: 65  Resp: (!) 22  Temp: 98.3 F (36.8 C)  TempSrc: Temporal  SpO2: 97%  Weight: 165 lb 12.8 oz (75.2 kg)  Height: 5' 11 (1.803 m)    GENERAL: The patient is a well-nourished male, in no acute distress. The vital signs are documented above. CARDIAC: There is a regular rate and rhythm.  PULMONARY: Non-labored respirations ABDOMEN: Soft and non-tender  MUSCULOSKELETAL: There are no major deformities or cyanosis. NEUROLOGIC: No focal weakness or paresthesias are detected. SKIN: There are no ulcers or rashes noted. PSYCHIATRIC: The patient has a normal affect.  STUDIES:   I have reviewed the following: 1. Infrarenal abdominal aortic aneurysm measuring up to 5.1 cm without rupture; recommend CT or MRI follow-up in 6 months and establish or continue care with a vascular specialist. 2. 2.4 cm left common iliac artery aneurysm and 2.3 cm right common iliac artery aneurysm. 3. Indeterminate left renal cystic lesions, including 2.0 cm (48 HU) and 1.2 cm (63 HU), possibly hemorrhagic or proteinaceous; recommend MRI abdomen with and without IV contrast to exclude renal mass.  MEDICAL ISSUES:   AAA: Maximum diameter is greater than 5.1 cm.  I have recommended repair to prevent rupture.  He is a straightforward endovascular candidate.  I discussed the details of the operation including the risks and benefits.  We talked about the risk of death, stroke, cardiopulmonary complications, renal issues, GI ischemia.  All questions were answered and he wishes to proceed.  He will need to be off of his Eliquis  prior to surgery  Renal lesion was seen on CT scan.  I am ordering a MRI for  follow-up  PAD: History of left SFA stenting in 2009, stable       Malvina Serene CLORE, MD, FACS [facility] [phone] Pager [phone]     [1]  Allergies Allergen Reactions   Penicillins Swelling and Nausea Only   "

## 2024-10-24 ENCOUNTER — Ambulatory Visit: Admitting: Nurse Practitioner

## 2024-10-24 ENCOUNTER — Encounter: Payer: Self-pay | Admitting: Nurse Practitioner

## 2024-10-24 VITALS — BP 139/88 | HR 66 | Temp 98.1°F | Ht 71.0 in | Wt 165.0 lb

## 2024-10-24 DIAGNOSIS — Z789 Other specified health status: Secondary | ICD-10-CM

## 2024-10-24 DIAGNOSIS — G9340 Encephalopathy, unspecified: Secondary | ICD-10-CM

## 2024-10-24 DIAGNOSIS — F03A0 Unspecified dementia, mild, without behavioral disturbance, psychotic disturbance, mood disturbance, and anxiety: Secondary | ICD-10-CM

## 2024-10-24 DIAGNOSIS — J111 Influenza due to unidentified influenza virus with other respiratory manifestations: Secondary | ICD-10-CM

## 2024-10-24 NOTE — Patient Instructions (Signed)
 Dementia Caregiver Guide Dementia is a condition that affects the way the brain works. It often affects thinking and memory. A person with dementia may: Forget things. Have trouble talking or responding to your questions. Have trouble paying attention. Have trouble thinking clearly and making good decisions. Get lost or wander away from home or other places. Have big changes in their mood or emotions. They may: Feel very worried, nervous, or depressed. Have angry outbursts. Be suspicious or accuse you of things. Have childlike behavior and language. Taking care of someone with dementia can be a challenge. The tips below can help you care for the person. How to help manage lifestyle changes Dementia usually gets worse slowly over time. In the early stages, people with dementia can stay safe and take care of themselves with some help. In later stages, they need help with daily tasks like getting dressed, grooming, and going to the bathroom. Communicating When the person is talking and seems frustrated, make eye contact and hold the person's hand. Ask questions that can be answered with a yes or no. Use simple words and a calm voice. Only give one direction at a time. Limit choices for the person. Too many choices can be stressful. Avoid correcting the person in a negative way. If the person can't find the right words, gently try to help. Preventing injury  Keep floors clear. Remove rugs, magazine racks, and floor lamps. Keep hallways well lit, especially at night. Put a handrail and nonslip mat in the bathtub or shower. Put childproof locks on cabinets that have dangerous items in them. These items include medicine, alcohol, guns, cleaning products, and sharp tools. For doors to the outside, put locks where the person can't see or reach them. This helps keep the person from going out of the house and getting lost. Be ready for emergencies. Keep a list of emergency phone numbers and  addresses close by. Remove car keys and lock garage doors so the person doesn't try to drive. Have the person wear a bracelet that tracks where they are and shows that they're a person with memory loss. This should be worn at all times for safety. Helping with daily life  Keep the person on track with their daily routine. Try to identify areas where the person may need help. Be supportive, patient, calm, and encouraging. Gently remind the person that adjusting to changes takes time. Help with the tasks that the person has asked for help with. Keep the person involved in daily tasks and decisions as much as you can. Encourage conversation, but try not to get frustrated if the person struggles to find words or doesn't seem to appreciate your help. Other tips Think about any safety risks and take steps to avoid them. Keep things organized: Organize medicines in a pill box for each day of the week. Keep a calendar in a central place. Use it to remind the person of health care visits or other activities. Create a plan to handle any legal or financial matters. Get help from a professional if needed. Help make sure the person: Takes medicines only as told by their health care providers. Eats regular, healthy meals. They should also drink plenty of fluids. Goes to all scheduled health care appointments. Gets regular sleep. Taking care of yourself Being a caregiver for someone with dementia can be hard. You may feel stressed and have many other emotions. It's important to also take care of yourself. Here are some tips: Find out about services that  can provide short-term care for the person. This is called respite care. It can allow you to take a break when you need one. Find healthy ways to deal with stress. Some ways include: Spending time with other people. Exercising. Meditating or doing deep breathing exercises. Take care of your own health by: Getting enough sleep. Eating healthy  foods. Getting regular exercise. Join a support group with others who are caregivers. These groups can help you: Learn other ways to deal with stress. Share experiences with others. Get emotional comfort and support. Learn about caregiving as the disease gets worse. Find resources in your community. Where to find support: Many people and organizations offer support. These include: Support groups for people with dementia. Support groups for caregivers. Counselors or therapists. Home health care services. Adult day care centers. Where to find more information Alzheimer's Association: WesternT[employer].it Family Caregiver Alliance: caregiver.org Alzheimer's Foundation of [place]: alzfdn.org Contact a health care provider if: The person's health is quickly getting worse. You're no longer able to care for the person. Caring for the person is affecting your physical and emotional health. You're feeling worried, nervous, or depressed about caring for the person. Get help right away if: You feel like the person may hurt themselves or others. The person has talked about taking their own life. These symptoms may be an emergency. Take one of these steps right away: Go to your nearest emergency room. Call 911. Call the National Suicide Prevention Lifeline at (629)578-6266 or 988. Text the Crisis Text Line at 973-762-3480. This information is not intended to replace advice given to you by your health care provider. Make sure you discuss any questions you have with your health care provider. Document Revised: 01/06/2023 Document Reviewed: 01/06/2023 Elsevier Patient Education  2024 [employer].

## 2024-10-24 NOTE — Progress Notes (Signed)
 "  Subjective:    Patient ID: Tommy [name], male    DOB: [birth year], 74 y.o.   MRN: [id number]   Chief Complaint: Transition of care  HPI  Today's visit was for Transitional Care Management.  The patient was discharged from [facility] on 10/11/23 with a primary diagnosis of influenza.   Contact with the patient and/or caregiver, by a clinical staff member, was made on 10/14/24 and was documented as a telephone encounter within the EMR.  Through chart review and discussion with the patient I have determined that management of their condition is of moderate complexity.    Patient dx with influenza and was treated tamiflu . His family said he is doing much better. But his memory and confusion is worsening    10/24/2024    2:34 PM 06/17/2024    9:24 AM 06/15/2023   11:34 AM 06/01/2022   12:08 PM 04/30/2021   10:16 AM  6CIT Screen  What Year? 0 points 0 points 0 points 0 points 0 points  What month? 0 points 0 points 0 points 0 points 0 points  What time? 0 points 0 points 0 points 0 points 0 points  Count back from 20 0 points 0 points 0 points 0 points 0 points  Months in reverse 2 points 0 points 0 points 0 points 0 points  Repeat phrase 0 points 0 points 0 points 0 points 0 points  Total Score 2 points 0 points 0 points 0 points 0 points       Patient Active Problem List   Diagnosis Date Noted   Acute encephalopathy 10/07/2024   Encounter for monitoring amiodarone  therapy 08/09/2023   History of recent fall 08/09/2023   Dizziness, nonspecific 08/09/2023   Syncope 08/09/2023   Tachy-brady syndrome (HCC) 08/09/2023   Left ventricular hypertrophy 06/18/2023   Atrial fibrillation (HCC) 09/14/2022   Hypercoagulable state due to paroxysmal atrial fibrillation (HCC) 09/14/2022   History of colonic polyps 12/16/2020   CAD (coronary artery disease) 07/03/2019   GERD without esophagitis 07/03/2019   Prediabetes 07/05/2018   Overweight (BMI 25.0-29.9) 05/09/2018   Hyperlipidemia LDL  goal <130 05/18/2015   Stage T1c adenocarcinoma of the prostate with a Gleason's Score of 3+4 and a PSA of 5.73 02/01/2015   Peripheral vascular disease, unspecified (HCC) 06/27/2012   PAD (peripheral artery disease) 02/09/2011   Essential hypertension 02/09/2011       Review of Systems  Constitutional:  Negative for diaphoresis.  Eyes:  Negative for pain.  Respiratory:  Negative for shortness of breath.   Cardiovascular:  Negative for chest pain, palpitations and leg swelling.  Gastrointestinal:  Negative for abdominal pain.  Endocrine: Negative for polydipsia.  Skin:  Negative for rash.  Neurological:  Negative for dizziness, weakness and headaches.  Hematological:  Does not bruise/bleed easily.  All other systems reviewed and are negative.      Objective:   Physical Exam Vitals and nursing note reviewed.  Constitutional:      Appearance: Normal appearance. He is well-developed.  HENT:     Head: Normocephalic.     Nose: Nose normal.     Mouth/Throat:     Mouth: Mucous membranes are moist.     Pharynx: Oropharynx is clear.  Eyes:     Pupils: Pupils are equal, round, and reactive to light.  Neck:     Thyroid : No thyroid  mass or thyromegaly.     Vascular: No carotid bruit or JVD.     Trachea: Phonation normal.  Cardiovascular:     Rate and Rhythm: Normal rate and regular rhythm.  Pulmonary:     Effort: Pulmonary effort is normal. No respiratory distress.     Breath sounds: Normal breath sounds.  Abdominal:     General: Bowel sounds are normal.     Palpations: Abdomen is soft.     Tenderness: There is no abdominal tenderness.  Musculoskeletal:        General: Normal range of motion.     Cervical back: Normal range of motion and neck supple.  Lymphadenopathy:     Cervical: No cervical adenopathy.  Skin:    General: Skin is warm and dry.  Neurological:     Mental Status: He is alert and oriented to person, place, and time.  Psychiatric:        Behavior:  Behavior normal.        Thought Content: Thought content normal.        Judgment: Judgment normal.    BP 139/88   Pulse 66   Temp 98.1 F (36.7 C) (Temporal)   Ht 5' 11 (1.803 m)   Wt 165 lb (74.8 kg)   SpO2 96%   BMI 23.01 kg/m         Assessment & Plan:  Tommy [name] in today with chief complaint of Transitions Of Care   1. Transition of care (Primary) Hospital records reviewed  2. Influenza resolved  3. Mild dementia without behavioral disturbance, psychotic disturbance, mood disturbance, or anxiety, unspecified dementia type (HCC) [place] daily Scored well on 6cit RTO prn    The above assessment and management plan was discussed with the patient. The patient verbalized understanding of and has agreed to the management plan. Patient is aware to call the clinic if symptoms persist or worsen. Patient is aware when to return to the clinic for a follow-up visit. Patient educated on when it is appropriate to go to the emergency department.   Tommy Gladis, FNP    "

## 2024-10-24 NOTE — Addendum Note (Signed)
 Addended by: TRUDY ELVIE SAILOR on: 10/24/2024 10:58 AM   Modules accepted: Orders

## 2024-10-31 ENCOUNTER — Ambulatory Visit (HOSPITAL_COMMUNITY)
Admission: RE | Admit: 2024-10-31 | Discharge: 2024-10-31 | Disposition: A | Discharge status: Home / Self Care | Source: Ambulatory Visit | Attending: Surgery | Admitting: Surgery

## 2024-10-31 DIAGNOSIS — N281 Cyst of kidney, acquired: Secondary | ICD-10-CM | POA: Diagnosis present

## 2024-10-31 MED ORDER — GADOBUTROL 1 MMOL/ML IV SOLN
7.0000 mL | Freq: Once | INTRAVENOUS | Status: AC | PRN
  Administered 2024-10-31: 7 mL via INTRAVENOUS

## 2024-11-06 ENCOUNTER — Telehealth: Payer: Self-pay | Admitting: Family Medicine

## 2024-11-06 ENCOUNTER — Telehealth: Payer: Self-pay

## 2024-11-06 NOTE — Telephone Encounter (Signed)
 Left detailed message for Tommy [name], giving verbal approval for 1 more OT visit and Speech Eval. Advised for her to call back if she has any questions/concerns.

## 2024-11-06 NOTE — Telephone Encounter (Signed)
 Copied from CRM [id number]. Topic: Clinical - Home Health Verbal Orders >> Nov 06, 2024 10:23 AM Diannia DEL wrote: Caller/Agency: [facility] Callback Number: [phone] Service Requested: Occupational Therapy Frequency: One more OT visit, Speech Eval Any new concerns about the patient? Yes  Patient had a falling on 11/05/2024 and he is unsteady.

## 2024-11-06 NOTE — Telephone Encounter (Signed)
 Copied from CRM #[id number]. Topic: General - Other >> Nov 06, 2024 10:22 AM Diannia H wrote: Reason for CRM: Mallory from [facility] wants to report the patient had a fall last night with no injury that the patient is reporting but he is unsteady.

## 2024-11-08 ENCOUNTER — Other Ambulatory Visit (HOSPITAL_COMMUNITY)

## 2024-11-08 NOTE — Telephone Encounter (Signed)
 FYI, no call back #

## 2024-11-13 ENCOUNTER — Telehealth: Payer: Self-pay

## 2024-11-13 ENCOUNTER — Ambulatory Visit

## 2024-11-13 NOTE — Telephone Encounter (Signed)
 Verbal given.

## 2024-11-13 NOTE — Telephone Encounter (Signed)
 Copied from CRM #[id number]. Topic: Clinical - Home Health Verbal Orders >> Nov 13, 2024  8:30 AM Susanna ORN wrote: Caller/Agency: [facility] Callback Number: [phone] Service Requested: Occupational Therapy Frequency: Called to move discharge from OT to next week due to drive way unacceptable. Any new concerns about the patient? No

## 2024-11-29 ENCOUNTER — Encounter (HOSPITAL_COMMUNITY): Admission: RE | Payer: Self-pay | Source: Home / Self Care

## 2024-11-29 ENCOUNTER — Inpatient Hospital Stay (HOSPITAL_COMMUNITY): Admission: RE | Admit: 2024-11-29 | Admitting: Surgery

## 2025-03-26 ENCOUNTER — Encounter: Admitting: Family Medicine

## 2025-06-19 ENCOUNTER — Ambulatory Visit: Payer: Self-pay
